# Patient Record
Sex: Female | Born: 1980
Health system: Southern US, Community
[De-identification: ages and names within clinical notes are randomized; demographics above are authoritative.]

## PROBLEM LIST (undated history)

## (undated) ENCOUNTER — Emergency Department (HOSPITAL_COMMUNITY): Admission: EM | Payer: 59 | Source: Home / Self Care

## (undated) DIAGNOSIS — Z9889 Other specified postprocedural states: Secondary | ICD-10-CM

## (undated) DIAGNOSIS — D649 Anemia, unspecified: Secondary | ICD-10-CM

## (undated) DIAGNOSIS — R112 Nausea with vomiting, unspecified: Secondary | ICD-10-CM

## (undated) DIAGNOSIS — N920 Excessive and frequent menstruation with regular cycle: Secondary | ICD-10-CM

## (undated) DIAGNOSIS — J189 Pneumonia, unspecified organism: Secondary | ICD-10-CM

## (undated) DIAGNOSIS — I82402 Acute embolism and thrombosis of unspecified deep veins of left lower extremity: Secondary | ICD-10-CM

## (undated) HISTORY — PX: WISDOM TOOTH EXTRACTION: SHX21

## (undated) HISTORY — DX: Anemia, unspecified: D64.9

## (undated) HISTORY — PX: TUBAL LIGATION: SHX77

## (undated) HISTORY — PX: CHOLECYSTECTOMY: SHX55

## (undated) HISTORY — DX: Acute embolism and thrombosis of unspecified deep veins of left lower extremity: I82.402

## (undated) HISTORY — DX: Excessive and frequent menstruation with regular cycle: N92.0

---

## 2012-10-06 ENCOUNTER — Emergency Department: Payer: Self-pay | Admitting: Emergency Medicine

## 2012-10-06 LAB — CBC WITH DIFFERENTIAL/PLATELET
Basophil %: 0.9 %
Eosinophil #: 0.2 10*3/uL (ref 0.0–0.7)
Eosinophil %: 1.7 %
HCT: 35 % (ref 35.0–47.0)
HGB: 11.8 g/dL — ABNORMAL LOW (ref 12.0–16.0)
MCH: 26.7 pg (ref 26.0–34.0)
MCHC: 33.6 g/dL (ref 32.0–36.0)
MCV: 79 fL — ABNORMAL LOW (ref 80–100)
Monocyte %: 5.1 %
Neutrophil %: 65.2 %
Platelet: 367 10*3/uL (ref 150–440)
RDW: 14.8 % — ABNORMAL HIGH (ref 11.5–14.5)
WBC: 12 10*3/uL — ABNORMAL HIGH (ref 3.6–11.0)

## 2012-10-06 LAB — URINALYSIS, COMPLETE
Bilirubin,UR: NEGATIVE
Ketone: NEGATIVE
Nitrite: NEGATIVE
Protein: NEGATIVE
RBC,UR: 1 /HPF (ref 0–5)
Specific Gravity: 1.015 (ref 1.003–1.030)
Squamous Epithelial: 2

## 2012-10-06 LAB — BASIC METABOLIC PANEL
BUN: 11 mg/dL (ref 7–18)
Calcium, Total: 8.8 mg/dL (ref 8.5–10.1)
Chloride: 106 mmol/L (ref 98–107)
Co2: 28 mmol/L (ref 21–32)
Creatinine: 0.75 mg/dL (ref 0.60–1.30)
EGFR (Non-African Amer.): >60
Glucose: 76 mg/dL (ref 65–99)
Osmolality: 274 (ref 275–301)
Potassium: 4.1 mmol/L (ref 3.5–5.1)
Sodium: 138 mmol/L (ref 136–145)

## 2013-02-16 ENCOUNTER — Emergency Department: Payer: Self-pay | Admitting: Emergency Medicine

## 2013-02-16 LAB — URINALYSIS, COMPLETE
Bilirubin,UR: NEGATIVE
Glucose,UR: NEGATIVE mg/dL (ref 0–75)
Ketone: NEGATIVE
Leukocyte Esterase: NEGATIVE
Ph: 5 (ref 4.5–8.0)
Protein: NEGATIVE
RBC,UR: 31 /HPF (ref 0–5)
Squamous Epithelial: <1

## 2013-02-16 LAB — CBC
HCT: 37.4 % (ref 35.0–47.0)
MCV: 81 fL (ref 80–100)
RDW: 15.3 % — ABNORMAL HIGH (ref 11.5–14.5)

## 2013-02-16 LAB — HCG, QUANTITATIVE, PREGNANCY: Beta Hcg, Quant.: <1 m[IU]/mL — ABNORMAL LOW

## 2013-02-26 ENCOUNTER — Encounter (HOSPITAL_COMMUNITY): Payer: Self-pay | Admitting: Emergency Medicine

## 2013-02-26 ENCOUNTER — Emergency Department (HOSPITAL_COMMUNITY)
Admission: EM | Admit: 2013-02-26 | Discharge: 2013-02-26 | Disposition: A | Discharge status: Home / Self Care | Payer: Self-pay | Attending: Emergency Medicine | Admitting: Emergency Medicine

## 2013-02-26 ENCOUNTER — Emergency Department (HOSPITAL_COMMUNITY): Payer: Self-pay

## 2013-02-26 DIAGNOSIS — R630 Anorexia: Secondary | ICD-10-CM | POA: Insufficient documentation

## 2013-02-26 DIAGNOSIS — Z9089 Acquired absence of other organs: Secondary | ICD-10-CM | POA: Insufficient documentation

## 2013-02-26 DIAGNOSIS — N390 Urinary tract infection, site not specified: Secondary | ICD-10-CM | POA: Insufficient documentation

## 2013-02-26 DIAGNOSIS — R110 Nausea: Secondary | ICD-10-CM | POA: Insufficient documentation

## 2013-02-26 DIAGNOSIS — R109 Unspecified abdominal pain: Secondary | ICD-10-CM | POA: Insufficient documentation

## 2013-02-26 DIAGNOSIS — Z3202 Encounter for pregnancy test, result negative: Secondary | ICD-10-CM | POA: Insufficient documentation

## 2013-02-26 LAB — COMPREHENSIVE METABOLIC PANEL
ALT: 14 U/L (ref 0–35)
AST: 17 U/L (ref 0–37)
Albumin: 3.7 g/dL (ref 3.5–5.2)
Alkaline Phosphatase: 98 U/L (ref 39–117)
BUN: 8 mg/dL (ref 6–23)
CO2: 27 mEq/L (ref 19–32)
Calcium: 9.4 mg/dL (ref 8.4–10.5)
Chloride: 103 mEq/L (ref 96–112)
Creatinine, Ser: 0.84 mg/dL (ref 0.50–1.10)
GFR calc Af Amer: >90 mL/min (ref >90)
GFR calc non Af Amer: >90 mL/min (ref >90)
Glucose, Bld: 90 mg/dL (ref 70–99)
Potassium: 4.2 mEq/L (ref 3.5–5.1)
Sodium: 138 mEq/L (ref 135–145)
Total Bilirubin: 0.6 mg/dL (ref 0.3–1.2)
Total Protein: 7.4 g/dL (ref 6.0–8.3)

## 2013-02-26 LAB — CBC WITH DIFFERENTIAL/PLATELET
Basophils Absolute: 0.1 10*3/uL (ref 0.0–0.1)
Basophils Relative: 1 % (ref 0–1)
Eosinophils Absolute: 0.1 10*3/uL (ref 0.0–0.7)
Eosinophils Relative: 1 % (ref 0–5)
HCT: 35.6 % — ABNORMAL LOW (ref 36.0–46.0)
Hemoglobin: 12.1 g/dL (ref 12.0–15.0)
Lymphocytes Relative: 25 % (ref 12–46)
Lymphs Abs: 3 10*3/uL (ref 0.7–4.0)
MCH: 27.1 pg (ref 26.0–34.0)
MCHC: 34 g/dL (ref 30.0–36.0)
MCV: 79.8 fL (ref 78.0–100.0)
Monocytes Absolute: 0.6 10*3/uL (ref 0.1–1.0)
Monocytes Relative: 5 % (ref 3–12)
Neutro Abs: 8.1 10*3/uL — ABNORMAL HIGH (ref 1.7–7.7)
Neutrophils Relative %: 68 % (ref 43–77)
Platelets: 400 10*3/uL (ref 150–400)
RBC: 4.46 MIL/uL (ref 3.87–5.11)
RDW: 14.2 % (ref 11.5–15.5)
WBC: 11.9 10*3/uL — ABNORMAL HIGH (ref 4.0–10.5)

## 2013-02-26 LAB — URINE MICROSCOPIC-ADD ON

## 2013-02-26 LAB — URINALYSIS, ROUTINE W REFLEX MICROSCOPIC
Bilirubin Urine: NEGATIVE
Glucose, UA: NEGATIVE mg/dL
Hgb urine dipstick: NEGATIVE
Ketones, ur: NEGATIVE mg/dL
Nitrite: NEGATIVE
Protein, ur: NEGATIVE mg/dL
Specific Gravity, Urine: 1.017 (ref 1.005–1.030)
Urobilinogen, UA: 0.2 mg/dL (ref 0.0–1.0)
pH: 5 (ref 5.0–8.0)

## 2013-02-26 LAB — LIPASE, BLOOD: Lipase: 16 U/L (ref 11–59)

## 2013-02-26 MED ORDER — SODIUM CHLORIDE 0.9 % IV BOLUS (SEPSIS)
1000.0000 mL | Freq: Once | INTRAVENOUS | Status: AC
  Administered 2013-02-26: 1000 mL via INTRAVENOUS

## 2013-02-26 MED ORDER — NITROFURANTOIN MONOHYD MACRO 100 MG PO CAPS: 100.0000 mg | ORAL_CAPSULE | Freq: Two times a day (BID) | ORAL | Status: DC

## 2013-02-26 MED ORDER — ONDANSETRON 4 MG PO TBDP
4.0000 mg | ORAL_TABLET | Freq: Once | ORAL | Status: AC
  Administered 2013-02-26: 4 mg via ORAL
  Filled 2013-02-26: qty 1

## 2013-02-26 MED ORDER — ONDANSETRON HCL 4 MG/2ML IJ SOLN
4.0000 mg | Freq: Once | INTRAMUSCULAR | Status: AC
  Administered 2013-02-26: 4 mg via INTRAVENOUS
  Filled 2013-02-26: qty 2

## 2013-02-26 MED ORDER — IBUPROFEN 800 MG PO TABS: 800.0000 mg | ORAL_TABLET | Freq: Three times a day (TID) | ORAL | Status: DC

## 2013-02-26 MED ORDER — IOHEXOL 350 MG/ML SOLN
100.0000 mL | Freq: Once | INTRAVENOUS | Status: AC | PRN
  Administered 2013-02-26: 100 mL via INTRAVENOUS

## 2013-02-26 NOTE — ED Notes (Addendum)
PA at bedside.

## 2013-02-26 NOTE — ED Notes (Signed)
Pt reports abdominal pain x 1 week that is on left lower quad. Pain is 7/10. Positive for nausea denies vomiting or diarrhea. Denies fever. Pt reports not wanting to eat anything. Alert and oriented. NAD.

## 2013-02-26 NOTE — ED Provider Notes (Signed)
Medical screening examination/treatment/procedure(s) were conducted as a shared visit with non-physician practitioner(s) and myself.  I personally evaluated the patient during the encounter.  32 year old female with abdominal pain. For quadrant. Gradual onset about a week here. Pain waxes and wanes but does not completely go away. Achy with occasional sharper pain. No appreciable exacerbating/relieving factors. No vomiting or diarrhea. No urinary complaints. No vaginal bleeding or discharge. No history of similar pain. On exam pt is laying in bed in NAD. Clinically well appearing. Tenderness in her left upper quadrant with voluntary guarding on deep palpation. No rebound tenderness. No distention. Patient reports a history of splenomegaly. Her spleen was nonpalpable on my exam although somewhat limited given patient's obesity. She also reports a history of "spots on my spleen" which she says were noted on a routine cholecystectomy in 2012. No CVA tenderness. No local care. Recently moved to area and previous care in [place]. She is not sure the exact etiology of this and never pursued followup. Her brother has a history of DVT and pulmonary embolism. They do not have a very close relationship and she's not sure what precipitated these blood clots. She has no known personal history of coagulopathy. Not sure of exact etiology of pt's pain. Not convincing story for ureteral colic and no blood noted on UA. Not sure of what to make of history of splenic "spots." Will CT to further eval. Angiography to assess for possible splenic infarct.  Raeford Razor, MD 02/26/13 [phone]

## 2013-02-26 NOTE — Progress Notes (Signed)
   CARE MANAGEMENT ED NOTE 02/26/2013  Patient:  Robin Lopez,Robin Lopez   Account Number:  [id number]  Date Initiated:  02/26/2013  Documentation initiated by:  Radford Pax  Subjective/Objective Assessment:   Patient presented to ED with abdominal pain     Subjective/Objective Assessment Detail:     Action/Plan:   Action/Plan Detail:   Anticipated DC Date:       Status Recommendation to Physician:   Result of Recommendation:    Other ED Services  Consult Working Plan    DC Planning Services  Other  PCP issues    Choice offered to / List presented to:            Status of service:  Completed, signed off  ED Comments:   ED Comments Detail:  Patient listed as not having insurance or pcp.  [facility] represenattive has seen patient today and provided inforation for the orange card.  EDCM provided list of pcps who accept self pay patients and infromation for Adult Care Act.  Also provided medication assistance such as needymeds.org, and pharmacies who offer discount prescriptions such as [facility], [facility], and [facility] etc. Patient thankful for information.

## 2013-02-26 NOTE — ED Provider Notes (Signed)
History    CSN: [id number] Arrival date & time 02/26/13  1207  First MD Initiated Contact with Patient 02/26/13 1333     Chief Complaint  Patient presents with  . Abdominal Pain   (Consider location/radiation/quality/duration/timing/severity/associated sxs/prior Treatment) HPI Pt is a 32yo female with presenting with 1 week hx of left sided abdominal pain, 7/10 but 10/10 at worst.  Pain feels like "heavy, stabbing" Pt states she feels bloated, pain radiates to left flank and epigastrium.  Reports severe nausea but denies vomiting or diarrhea. Unsure if she's had a fever. Has tried aleve w/o relief.  Lying on left side makes pain worse.  Denies urinary symptoms.  Reports hx of cholecystectomy in 2012 but no other medical hx.  Denies hx of kidney stones. LMP last week, nl per pt.   History reviewed. No pertinent past medical history. Past Surgical History  Procedure Laterality Date  . Cholecystectomy     No family history on file. History  Substance Use Topics  . Smoking status: Never Smoker   . Smokeless tobacco: Not on file  . Alcohol Use: No   OB History   Grav Para Term Preterm Abortions TAB SAB Ect Mult Living                 Review of Systems  Constitutional: Positive for appetite change ( decreased for 1 week). Negative for fever, chills, diaphoresis and fatigue.  Gastrointestinal: Positive for nausea and abdominal pain. Negative for vomiting, diarrhea and constipation.  Genitourinary: Positive for flank pain ( left). Negative for dysuria, frequency, hematuria, menstrual problem and pelvic pain.  All other systems reviewed and are negative.    Allergies  Prednisone  Home Medications   Current Outpatient Rx  Name  Route  Sig  Dispense  Refill  . naproxen sodium (ANAPROX) 220 MG tablet   Oral   Take 220 mg by mouth as needed (for pain).         Marland Kitchen ibuprofen (ADVIL,MOTRIN) 800 MG tablet   Oral   Take 1 tablet (800 mg total) by mouth 3 (three) times daily.  21 tablet   0   . nitrofurantoin, macrocrystal-monohydrate, (MACROBID) 100 MG capsule   Oral   Take 1 capsule (100 mg total) by mouth 2 (two) times daily.   10 capsule   0    BP 123/75  Pulse 80  Temp(Src) 98.3 F (36.8 C) (Oral)  Resp 16  SpO2 100%  LMP 02/19/2013 Physical Exam  Nursing note and vitals reviewed. Constitutional: She appears well-developed and well-nourished.  Pt lying on right side in exam bed, appears mildly uncomfortable.   HENT:  Head: Normocephalic and atraumatic.  Eyes: Conjunctivae are normal. No scleral icterus.  Neck: Normal range of motion.  Cardiovascular: Normal rate, regular rhythm and normal heart sounds.   Pulmonary/Chest: Effort normal and breath sounds normal. No respiratory distress. She has no wheezes. She has no rales. She exhibits no tenderness.  Abdominal: Soft. Bowel sounds are normal. She exhibits no distension and no mass. There is tenderness. There is CVA tenderness ( left). There is no rebound and no guarding.  Musculoskeletal: Normal range of motion.  Neurological: She is alert.  Skin: Skin is warm and dry. She is not diaphoretic.    ED Course  Procedures (including critical care time) Labs Reviewed  CBC WITH DIFFERENTIAL - Abnormal; Notable for the following:    WBC 11.9 (*)    HCT 35.6 (*)    Neutro Abs 8.1 (*)  All other components within normal limits  URINALYSIS, ROUTINE W REFLEX MICROSCOPIC - Abnormal; Notable for the following:    APPearance CLOUDY (*)    Leukocytes, UA MODERATE (*)    All other components within normal limits  URINE MICROSCOPIC-ADD ON - Abnormal; Notable for the following:    Squamous Epithelial / LPF FEW (*)    Bacteria, UA FEW (*)    All other components within normal limits  URINE CULTURE  COMPREHENSIVE METABOLIC PANEL  LIPASE, BLOOD  POCT PREGNANCY, URINE   Ct Cta Abd/pel W/cm &/or W/o Cm  02/26/2013   *RADIOLOGY REPORT*  Clinical Data: Left upper quadrant abdominal pain; evaluate for  splenic infarcts; history of "spots" on the spleen and liver  CT ANGIOGRAPHY ABDOMEN AND PELVIS  Technique:  Multidetector CT imaging of the abdomen and pelvis was performed using the standard protocol during bolus administration of intravenous contrast.  Multiplanar reconstructed images including MIPs were obtained and reviewed to evaluate the vascular anatomy.  Contrast: OMNIPAQUE IOHEXOL 350 MG/ML SOLN  Comparison:   None.  Findings:  Abdominal aorta:  Normal caliber abdominal aorta.  No abdominal aortic dissection or periaortic stranding.  Celiac artery:  Widely patent without hemodynamically significant narrowing.  Conventional branching pattern.  SMA:  Widely patent without hemodynamically significant narrowing.  Right renal artery:  Solitary; widely patent.  Left renal artery:  Duplicated; there is accessory left-sided renal artery which supplies the superior pole left kidney.  No evidence of hemodynamically significant narrowing.  IMA:  Widely patent.  Pelvic vasculature:  Widely patent.   Review of the MIP images confirms the above findings.  --------------------------------------------------------  Nonvascular findings:  Normal hepatic contour.  No discrete hepatic lesions.  Post cholecystectomy.  No intra or extra panic biliary dilatation.  No ascites.  There is symmetric enhancement and excretion of the bilateral kidneys. Incidental note is made of a partially exophytic approximately 1.5 cm hypoattenuating (20 HU) cyst arising from the anterior aspect the mid pole of the left kidney (coronal image 75, series nine).  No discrete right-sided renal lesions.  No definite renal stones.  No urinary obstruction or perinephric stranding. Normal appearance of the bilateral adrenal glands and pancreas.  There is an approximately 1.3 x 1.2 cm hypoattenuating, nonenhancing cyst within the subcapsular aspect of the spleen (image 46, series 4). An additional sub centimeter hypoattenuating lesion within the  adjacent spleen is too small to fully characterize (image 43).  No evidence of splenic infarction. Incidental note is made of a small splenule.  No perisplenic stranding.  The bowel is normal in course and caliber without wall thickening or evidence of obstruction.  Normal appearance of the appendix.  No pneumoperitoneum, pneumatosis or portal venous gas.  No retroperitoneal, mesenteric, pelvic or inguinal lymphadenopathy.  There is a suspected approximately 5.4 x 4.9 cm fibroid within an otherwise normal-appearing retroverted uterus (axial image 125, series four).  There is a small amount of fluid within the endometrial canal, presumably physiologic.  No discrete adnexal lesion.  Trace amount of presumably physiologic free fluid within the pelvic cul-de-sac.  Limited visualization of the lower thorax demonstrates linear subsegmental ground-glass atelectasis within the bilateral lower lobes, left greater than right.  No focal airspace opacities.  No pleural effusion.  Normal heart size.  No pericardial effusion.  No acute or aggressive osseous abnormalities. There is a small mesenteric fat containing periumbilical hernia.  IMPRESSION:  1.  No explanation for patient's left upper quadrant abdominal pain.  Specifically, no evidence  of splenic infarction.  2.  Note is made of an approximately 1.3 cm nonenhancing splenic cyst. 3.  Suspected approximately 5.4 cm fibroid within an otherwise normal-appearing retroverted uterus.   Small amount of presumably physiologic free fluid in the pelvic  4.  Approximately 1.5 cm left sided renal cyst.   Original Report Authenticated By: Tacey Ruiz, MD   1. Abdominal pain   2. UTI (lower urinary tract infection)     MDM  Pt c/o 1wk hx of left side abdominal pain: concern for pyelonephritis, nephrolithiasis, IBS, diverticulitis.  Vitals: mildly tachycardic, otherwise unremarkable    CBC: unremarkable CMP: unremarkable Lipase: nl UA: moderate leuks, due to pt's symptoms  will tx for possible UTI and have urine cultured Urine Preg: neg  Rx: macrobid and ibuprofen, Will discharge pt home and have her f/u with [facility] and [facility] info provided. Return precautions given. Pt verbalized understanding and agreement with tx plan. Vitals: unremarkable. Discharged in stable condition.    Discussed pt with attending during ED encounter.  8:47 PMUpon discharging pt, pt mentioned she has had problems with her spleen in the past including having some "spots" on her spleen with swelling.  States she does not know what the spots were but had been told by PCP in [place] to f/u with, however, this was never done.    Discussed with Dr. Juleen China who will speak with pt and likely perform CT scan.    CT angio looking for splenic infarct: no explanation for pt's LUQ pain. No evidence of splenic infarction.  There is a 1.3cm nonenhancing splenic cyst.  This is likely what pt's previous surgeon was referring to and pt's interpretation of "spots" on her spleen.  Will continue to tx for possible UTI and give ibuprofen for pain. Will discharge pt home and have her f/u with [facility] and [facility] info provided. Return precautions given. Pt verbalized understanding and agreement with tx plan. Vitals: unremarkable. Discharged in stable condition.    Discussed pt with attending during ED encounter.    Junius Finner, PA-C 02/26/13 2047

## 2013-02-27 LAB — URINE CULTURE: Colony Count: 1000

## 2013-03-03 NOTE — ED Provider Notes (Signed)
Medical screening examination/treatment/procedure(s) were performed by non-physician practitioner and as supervising physician I was immediately available for consultation/collaboration.  Flint Melter, MD 03/03/13 [phone]

## 2014-01-21 ENCOUNTER — Encounter (HOSPITAL_COMMUNITY): Payer: Self-pay | Admitting: *Deleted

## 2014-01-21 ENCOUNTER — Inpatient Hospital Stay (HOSPITAL_COMMUNITY)
Admission: AD | Admit: 2014-01-21 | Discharge: 2014-01-21 | Disposition: A | Discharge status: Home / Self Care | Payer: Self-pay | Source: Ambulatory Visit | Attending: Obstetrics & Gynecology | Admitting: Obstetrics & Gynecology

## 2014-01-21 DIAGNOSIS — N938 Other specified abnormal uterine and vaginal bleeding: Secondary | ICD-10-CM | POA: Insufficient documentation

## 2014-01-21 DIAGNOSIS — N925 Other specified irregular menstruation: Secondary | ICD-10-CM | POA: Insufficient documentation

## 2014-01-21 DIAGNOSIS — R109 Unspecified abdominal pain: Secondary | ICD-10-CM | POA: Insufficient documentation

## 2014-01-21 DIAGNOSIS — N949 Unspecified condition associated with female genital organs and menstrual cycle: Secondary | ICD-10-CM | POA: Insufficient documentation

## 2014-01-21 DIAGNOSIS — N926 Irregular menstruation, unspecified: Secondary | ICD-10-CM

## 2014-01-21 DIAGNOSIS — N939 Abnormal uterine and vaginal bleeding, unspecified: Secondary | ICD-10-CM

## 2014-01-21 DIAGNOSIS — Z9851 Tubal ligation status: Secondary | ICD-10-CM | POA: Insufficient documentation

## 2014-01-21 LAB — CBC
HCT: 35.5 % — ABNORMAL LOW (ref 36.0–46.0)
HEMOGLOBIN: 12.5 g/dL (ref 12.0–15.0)
MCH: 29.2 pg (ref 26.0–34.0)
MCHC: 35.2 g/dL (ref 30.0–36.0)
MCV: 82.9 fL (ref 78.0–100.0)
Platelets: 368 10*3/uL (ref 150–400)
RBC: 4.28 MIL/uL (ref 3.87–5.11)
RDW: 14 % (ref 11.5–15.5)
WBC: 11.4 10*3/uL — AB (ref 4.0–10.5)

## 2014-01-21 LAB — URINALYSIS, ROUTINE W REFLEX MICROSCOPIC
Bilirubin Urine: NEGATIVE
Glucose, UA: NEGATIVE mg/dL
KETONES UR: NEGATIVE mg/dL
LEUKOCYTES UA: NEGATIVE
NITRITE: NEGATIVE
PH: 6 (ref 5.0–8.0)
PROTEIN: NEGATIVE mg/dL
Specific Gravity, Urine: 1.02 (ref 1.005–1.030)
Urobilinogen, UA: 0.2 mg/dL (ref 0.0–1.0)

## 2014-01-21 LAB — HCG, QUANTITATIVE, PREGNANCY: hCG, Beta Chain, Quant, S: <1 m[IU]/mL (ref <5)

## 2014-01-21 LAB — WET PREP, GENITAL
Clue Cells Wet Prep HPF POC: NONE SEEN
Trich, Wet Prep: NONE SEEN
WBC WET PREP: NONE SEEN
Yeast Wet Prep HPF POC: NONE SEEN

## 2014-01-21 LAB — URINE MICROSCOPIC-ADD ON

## 2014-01-21 LAB — POCT PREGNANCY, URINE: PREG TEST UR: NEGATIVE

## 2014-01-21 MED ORDER — KETOROLAC TROMETHAMINE 60 MG/2ML IM SOLN
60.0000 mg | Freq: Once | INTRAMUSCULAR | Status: AC
  Administered 2014-01-21: 60 mg via INTRAMUSCULAR
  Filled 2014-01-21: qty 2

## 2014-01-21 NOTE — MAU Note (Signed)
Patient states she started having sharp shooting pain on 5-19 and has pain off and on now. States she thought she was starting her period with spotting but became heavy yesterday and now passing multiple large clots and having low back pain. Has nausea.

## 2014-01-21 NOTE — Discharge Instructions (Signed)

## 2014-01-21 NOTE — MAU Provider Note (Signed)
Attestation of Attending Supervision of Advanced Practitioner (CNM/NP): Evaluation and management procedures were performed by the Advanced Practitioner under my supervision and collaboration.  I have reviewed the Advanced Practitioner's note and chart, and I agree with the management and plan.  Lylla Eifler Harraway-Smith 2:17 PM

## 2014-01-21 NOTE — MAU Provider Note (Signed)
History     CSN: [id number]  Arrival date and time: 01/21/14 2119   First Provider Initiated Contact with Patient 01/21/14 1052      Chief Complaint  Patient presents with  . Vaginal Bleeding  . Abdominal Pain   HPI [name] is a 33 y.o. female G2P2002 who present with abnormal vaginal bleeding. Patient states "It is uncommon for me to have an abnormal cycle"; "my cycles are usually around the same time every month".  LMP: April 5th, this was a normal cycle.  Pt started spotting on Wednsnday and started experiencing heavy bleeding yesterday, along with passing a few large blood clots. She is also experiencing abdominal pain/cramping that she currently rates a 7/10. She took 1 aleve yesterday for her pain without relief; she has not taken anything for the pain today.  Pt had a tubal ligation 2 years ago. No history of fibroids or ovarian cysts.   OB History   Grav Para Term Preterm Abortions TAB SAB Ect Mult Living   2 2 2       2       History reviewed. No pertinent past medical history.  Past Surgical History  Procedure Laterality Date  . Cholecystectomy    . Cesarean section    . Tubal ligation      No family history on file.  History  Substance Use Topics  . Smoking status: Never Smoker   . Smokeless tobacco: Never Used  . Alcohol Use: No    Allergies:  Allergies  Allergen Reactions  . Prednisone Itching and Rash    No prescriptions prior to admission   Results for orders placed during the hospital encounter of 01/21/14 (from the past 48 hour(s))  URINALYSIS, ROUTINE W REFLEX MICROSCOPIC     Status: Abnormal   Collection Time    01/21/14  9:55 AM      Result Value Ref Range   Color, Urine YELLOW  YELLOW   APPearance HAZY (*) CLEAR   Specific Gravity, Urine 1.020  1.005 - 1.030   pH 6.0  5.0 - 8.0   Glucose, UA NEGATIVE  NEGATIVE mg/dL   Hgb urine dipstick LARGE (*) NEGATIVE   Bilirubin Urine NEGATIVE  NEGATIVE   Ketones, ur NEGATIVE   NEGATIVE mg/dL   Protein, ur NEGATIVE  NEGATIVE mg/dL   Urobilinogen, UA 0.2  0.0 - 1.0 mg/dL   Nitrite NEGATIVE  NEGATIVE   Leukocytes, UA NEGATIVE  NEGATIVE  CBC     Status: Abnormal   Collection Time    01/21/14  9:55 AM      Result Value Ref Range   WBC 11.4 (*) 4.0 - 10.5 K/uL   RBC 4.28  3.87 - 5.11 MIL/uL   Hemoglobin 12.5  12.0 - 15.0 g/dL   HCT 35.5 (*) 36.0 - 46.0 %   MCV 82.9  78.0 - 100.0 fL   MCH 29.2  26.0 - 34.0 pg   MCHC 35.2  30.0 - 36.0 g/dL   RDW 14.0  11.5 - 15.5 %   Platelets 368  150 - 400 K/uL  URINE MICROSCOPIC-ADD ON     Status: None   Collection Time    01/21/14  9:55 AM      Result Value Ref Range   Squamous Epithelial / LPF RARE  RARE   WBC, UA 0-2  <3 WBC/hpf   RBC / HPF 21-50  <3 RBC/hpf  HCG, QUANTITATIVE, PREGNANCY     Status: None   Collection  Time    01/21/14  9:55 AM      Result Value Ref Range   hCG, Beta Chain, Quant, S <1  <5 mIU/mL   Comment:              GEST. AGE      CONC.  (mIU/mL)       <=1 WEEK        5 - 50         2 WEEKS       50 - 500         3 WEEKS       100 - 10,000         4 WEEKS     1,000 - 30,000         5 WEEKS     3,500 - 115,000       6-8 WEEKS     12,000 - 270,000        12 WEEKS     15,000 - 220,000                FEMALE AND NON-PREGNANT FEMALE:         LESS THAN 5 mIU/mL  POCT PREGNANCY, URINE     Status: None   Collection Time    01/21/14 10:02 AM      Result Value Ref Range   Preg Test, Ur NEGATIVE  NEGATIVE   Comment:            THE SENSITIVITY OF THIS     METHODOLOGY IS >24 mIU/mL  WET PREP, GENITAL     Status: None   Collection Time    01/21/14 11:11 AM      Result Value Ref Range   Yeast Wet Prep HPF POC NONE SEEN  NONE SEEN   Trich, Wet Prep NONE SEEN  NONE SEEN   Clue Cells Wet Prep HPF POC NONE SEEN  NONE SEEN   WBC, Wet Prep HPF POC NONE SEEN  NONE SEEN   Comment: FEW BACTERIA SEEN     Review of Systems  Constitutional: Negative for fever and chills.  Gastrointestinal: Positive for  nausea and abdominal pain (Bilateral lower abdominal pain ). Negative for diarrhea and constipation.  Genitourinary: Negative for dysuria, urgency, frequency and hematuria.       No vaginal discharge. + vaginal bleeding. No dysuria.    Physical Exam   Blood pressure 108/60, pulse 60, temperature 98.7 F (37.1 C), temperature source Oral, resp. rate 16, height 5' 7.5" (1.715 m), weight 101.061 kg (222 lb 12.8 oz), last menstrual period 01/20/2014, SpO2 100.00%.  Physical Exam  Constitutional: She is oriented to person, place, and time. She appears well-developed and well-nourished. No distress.  HENT:  Head: Normocephalic.  Eyes: Pupils are equal, round, and reactive to light.  Neck: Neck supple.  Respiratory: Effort normal.  GI: Soft. There is tenderness (+ suprapubic tenderness ).  Genitourinary:  Speculum exam: Vagina - Small amount of dark red blood in vaginal canal, no odor, no blood clots noted Cervix - small active bleeding from os Bimanual exam: Cervix FT, no CMT  Uterus non tender, Enlarged uterus  Adnexa non tender, no masses bilaterally, +suprapubic tenderness  GC/Chlam, wet prep done Chaperone present for exam.   Musculoskeletal: Normal range of motion.  Neurological: She is alert and oriented to person, place, and time.  Skin: Skin is warm. She is not diaphoretic.  Psychiatric: Her behavior is normal.    MAU Course  Procedures None  MDM UA Upt CBC; hgb normal  Toradol 60 mg IM  Patient rates her pain 3/10 following Toradol Enlarged uterus on exam; Quant less than 1. Will schedule patient for outpatient pelvic US and have her follow up in the GYN clinic.    Assessment and Plan   A:  1. Episode of heavy vaginal bleeding      P:  Discharge home in stable condition  Pelvic US scheduled outpatient on 5/28 at 1500; pt made aware Message to the [facility] clinic sent for follow up. Ibuprofen as directed on the bottle for the next 3 days Bleeding  precautions discussed at length  [place], NP 01/21/2014, 10:52 AM

## 2014-01-22 LAB — GC/CHLAMYDIA PROBE AMP
CT PROBE, AMP APTIMA: NEGATIVE
GC PROBE AMP APTIMA: NEGATIVE

## 2014-01-27 ENCOUNTER — Other Ambulatory Visit (HOSPITAL_COMMUNITY): Payer: Self-pay | Admitting: Obstetrics and Gynecology

## 2014-01-27 ENCOUNTER — Ambulatory Visit (HOSPITAL_COMMUNITY): Admit: 2014-01-27 | Payer: Self-pay

## 2014-01-27 ENCOUNTER — Encounter: Payer: Self-pay | Admitting: Medical

## 2014-01-27 DIAGNOSIS — N939 Abnormal uterine and vaginal bleeding, unspecified: Secondary | ICD-10-CM

## 2014-02-25 ENCOUNTER — Encounter: Payer: Self-pay | Admitting: Medical

## 2014-05-09 ENCOUNTER — Encounter (HOSPITAL_COMMUNITY): Payer: Self-pay | Admitting: Emergency Medicine

## 2014-05-09 ENCOUNTER — Emergency Department (HOSPITAL_COMMUNITY)
Admission: EM | Admit: 2014-05-09 | Discharge: 2014-05-09 | Disposition: A | Discharge status: Home / Self Care | Payer: Self-pay | Attending: Emergency Medicine | Admitting: Emergency Medicine

## 2014-05-09 DIAGNOSIS — Z3202 Encounter for pregnancy test, result negative: Secondary | ICD-10-CM | POA: Insufficient documentation

## 2014-05-09 DIAGNOSIS — K625 Hemorrhage of anus and rectum: Secondary | ICD-10-CM | POA: Insufficient documentation

## 2014-05-09 DIAGNOSIS — K644 Residual hemorrhoidal skin tags: Secondary | ICD-10-CM | POA: Insufficient documentation

## 2014-05-09 DIAGNOSIS — K921 Melena: Secondary | ICD-10-CM | POA: Insufficient documentation

## 2014-05-09 LAB — CBC WITH DIFFERENTIAL/PLATELET
BASOS PCT: 0 % (ref 0–1)
Basophils Absolute: 0 10*3/uL (ref 0.0–0.1)
Eosinophils Absolute: 0.1 10*3/uL (ref 0.0–0.7)
Eosinophils Relative: 1 % (ref 0–5)
HCT: 35.4 % — ABNORMAL LOW (ref 36.0–46.0)
Hemoglobin: 12.4 g/dL (ref 12.0–15.0)
Lymphocytes Relative: 27 % (ref 12–46)
Lymphs Abs: 3.6 10*3/uL (ref 0.7–4.0)
MCH: 28.6 pg (ref 26.0–34.0)
MCHC: 35 g/dL (ref 30.0–36.0)
MCV: 81.6 fL (ref 78.0–100.0)
MONO ABS: 0.7 10*3/uL (ref 0.1–1.0)
MONOS PCT: 5 % (ref 3–12)
Neutro Abs: 8.7 10*3/uL — ABNORMAL HIGH (ref 1.7–7.7)
Neutrophils Relative %: 67 % (ref 43–77)
Platelets: 403 10*3/uL — ABNORMAL HIGH (ref 150–400)
RBC: 4.34 MIL/uL (ref 3.87–5.11)
RDW: 13.9 % (ref 11.5–15.5)
WBC: 13.1 10*3/uL — ABNORMAL HIGH (ref 4.0–10.5)

## 2014-05-09 LAB — URINALYSIS, ROUTINE W REFLEX MICROSCOPIC
Bilirubin Urine: NEGATIVE
Glucose, UA: NEGATIVE mg/dL
Ketones, ur: NEGATIVE mg/dL
LEUKOCYTES UA: NEGATIVE
Nitrite: NEGATIVE
PROTEIN: NEGATIVE mg/dL
Specific Gravity, Urine: 1.027 (ref 1.005–1.030)
Urobilinogen, UA: 1 mg/dL (ref 0.0–1.0)
pH: 5.5 (ref 5.0–8.0)

## 2014-05-09 LAB — COMPREHENSIVE METABOLIC PANEL
ALBUMIN: 3.8 g/dL (ref 3.5–5.2)
ALT: 37 U/L — ABNORMAL HIGH (ref 0–35)
ANION GAP: 18 — AB (ref 5–15)
AST: 43 U/L — ABNORMAL HIGH (ref 0–37)
Alkaline Phosphatase: 125 U/L — ABNORMAL HIGH (ref 39–117)
BUN: 20 mg/dL (ref 6–23)
CO2: 17 meq/L — AB (ref 19–32)
CREATININE: 0.75 mg/dL (ref 0.50–1.10)
Calcium: 9.4 mg/dL (ref 8.4–10.5)
Chloride: 103 mEq/L (ref 96–112)
GFR calc non Af Amer: >90 mL/min (ref >90)
Glucose, Bld: 96 mg/dL (ref 70–99)
Potassium: 4.1 mEq/L (ref 3.7–5.3)
Sodium: 138 mEq/L (ref 137–147)
Total Bilirubin: 0.6 mg/dL (ref 0.3–1.2)
Total Protein: 7.8 g/dL (ref 6.0–8.3)

## 2014-05-09 LAB — POC OCCULT BLOOD, ED: Fecal Occult Bld: POSITIVE — AB

## 2014-05-09 LAB — POC URINE PREG, ED: Preg Test, Ur: NEGATIVE

## 2014-05-09 LAB — URINE MICROSCOPIC-ADD ON

## 2014-05-09 NOTE — Discharge Instructions (Signed)
Do not hesitate to return to the emergency room for any new, worsening or concerning symptoms.  Please obtain primary care using resource guide below. But the minute you were seen in the emergency room and that they will need to obtain records for further outpatient management.   Bloody Diarrhea Bloody diarrhea can be caused by many different conditions. Most of the time bloody diarrhea is the result of food poisoning or minor infections. Bloody diarrhea usually improves over 2 to 3 days of rest and fluid replacement. Other conditions that can cause bloody diarrhea include:  Internal bleeding.  Infection.  Diseases of the bowel and colon. Internal bleeding from an ulcer or bowel disease can be severe and requires hospital care or even surgery. DIAGNOSIS  To find out what is wrong your caregiver may check your:  Stool.  Blood.  Results from a test that looks inside the body (endoscopy). TREATMENT   Get plenty of rest.  Drink enough water and fluids to keep your urine clear or pale yellow.  Do not smoke.  Solid foods and dairy products should be avoided until your illness improves.  As you improve, slowly return to a regular diet with easily-digested foods first. Examples are:  Bananas.  Rice.  Toast.  Crackers. You should only need these for about 2 days before adding more normal foods to your diet.  Avoid spicy or fatty foods as well as caffeine and alcohol for several days.  Medicine to control cramping and diarrhea can relieve symptoms but may prolong some cases of bloody diarrhea. Antibiotics can speed recovery from diarrhea due to some bacterial infections. Call your caregiver if diarrhea does not get better in 3 days. SEEK MEDICAL CARE IF:   You do not improve after 3 days.  Your diarrhea improves but your stool appears black. SEEK IMMEDIATE MEDICAL CARE IF:   You become extremely weak or faint.  You become very sweaty.  You have increased pain or  bleeding.  You develop repeated vomiting.  You vomit and you see blood or the vomit looks black in color.  You have a fever. Document Released: 08/19/2005 Document Revised: 11/11/2011 Document Reviewed: 07/21/2009 [facility] Patient Information 2015 [place], [place]. This information is not intended to replace advice given to you by your health care provider. Make sure you discuss any questions you have with your health care provider.    Emergency Department Resource Guide 1) Find a Doctor and Pay Out of Pocket Although you won't have to find out who is covered by your insurance plan, it is a good idea to ask around and get recommendations. You will then need to call the office and see if the doctor you have chosen will accept you as a new patient and what types of options they offer for patients who are self-pay. Some doctors offer discounts or will set up payment plans for their patients who do not have insurance, but you will need to ask so you aren't surprised when you get to your appointment.  2) Contact Your Local Health Department Not all health departments have doctors that can see patients for sick visits, but many do, so it is worth a call to see if yours does. If you don't know where your local health department is, you can check in your phone book. The CDC also has a tool to help you locate your state's health department, and many state websites also have listings of all of their local health departments.  3) Find a Walk-in  Clinic If your illness is not likely to be very severe or complicated, you may want to try a walk in clinic. These are popping up all over the country in pharmacies, drugstores, and shopping centers. They're usually staffed by nurse practitioners or physician assistants that have been trained to treat common illnesses and complaints. They're usually fairly quick and inexpensive. However, if you have serious medical issues or chronic medical problems, these are  probably not your best option.  No Primary Care Doctor: - Call Health Connect at  [phone] - they can help you locate a primary care doctor that  accepts your insurance, provides certain services, etc. - Physician Referral Service- [phone]  Chronic Pain Problems: Organization         Address  Phone   Notes  [facility]  [phone] Patients need to be referred by their primary care doctor.   Medication Assistance: Organization         Address  Phone   Notes  [facility] Medication [facility] [place] [place], [place] [address] [phone] --Must be a resident of [facility] -- Must have NO insurance coverage whatsoever (no Medicaid/ Medicare, etc.) -- The pt. MUST have a primary care doctor that directs their care regularly and follows them in the community   [facility]ist  [phone]   [employer]  [phone]    Agencies that provide inexpensive medical care: Organization         Address  Phone   Notes  [place]  [phone]   [facility]    [phone]   [facility] [place], [place] [address] [phone]   [place] [address], [place] [phone]   Planned Parenthood    [phone]   [facility]    [phone]   [place] [address], [place] Phone:  [phone], Fax:  [phone] Hours of Operation:  9 am - 6 pm, M-F.  Also accepts Medicaid/Medicare and self-pay.  [facility] [place], [address], [place] Phone: [phone], Fax: [phone]. Hours of Operation:  8:30 am - 5:30 pm, M-F.  Also accepts Medicaid and self-pay.  [facility] [place] [address], [place] Phone: [phone]   [place], [place], [place] [phone], Ext. [phone] Mondays & Thursdays:  7-9 AM.  First 15 patients are seen on a first come, first serve basis.    [place] Providers:  Organization         Address  Phone   Notes  [facility] [address], [place] [phone] Also accepts self-pay patients.  [facility] [address], [place]  [phone]   [place], [address], [place] [phone]   [facility] [address], [place] [phone]   Lucianne Lei [address], [place]   [phone] Only accepts [place] Access [place] patients after they have their name applied to their card.   Self-Pay (no insurance) in [facility]:  Organization         Address  Phone   Notes  Sickle Cell Patients, Investment banker, corporate Internal Medicine [place] (  [phone]   [facility] [place] [phone]   [facility] [place]  [place], [place], [place] [phone]   [facility]/Dr. Osei-Bonsu  [address], [place] or [address], [place] [phone] Phone number for both [place] and [place] locations is the same.  [facility] [address] [place] [phone]   [facility] [address], [place] or [address] [phone]   [facility] [address] [place] [phone], phone; [phone], fax Sees patients 1st and 3rd Saturday of every month.  Must not qualify for public or private insurance (i.e. Medicaid, Medicare, [place] Health Choice, Veterans' Benefits)  Household income should be no more than 200% of the poverty level The clinic cannot treat you if you are pregnant or think you are pregnant  Sexually transmitted diseases are not treated at the clinic.     Dental Care: Organization         Address  Phone  Notes  [facility] Department of [facility] [place] [phone] Accepts children up to age 62 who are enrolled in [place] or [place]; pregnant women with a Medicaid card; and children who have applied for Medicaid or [place] Health Choice, but were declined, whose parents can pay a reduced fee at time of service.  [facility] of [facility]  [address], [place] [phone] Accepts children up to age 33 who are enrolled in [place] or [place]; pregnant women with a Medicaid card; and children who have applied for Medicaid or [place] Health Choice, but were declined, whose parents can pay a reduced fee at time of service.  [place] [facility] PRO[facility]  [place] [phone] Patients are seen by appointment only. Walk-ins are not accepted. [place] will see patients 23 years of age and older. Monday - Tuesday (8am-5pm) Most Wednesdays (8:30-5pm) $30 per visit, cash only  [facility] Access PRO[facility]  [address], [facility] [phone] Patients are seen by appointment only. Walk-ins are not accepted. [place] will see patients 63 years of age and older. One Wednesday Evening (Monthly: Volunteer Based).  $30 per visit, cash only  [place]  [phone] for adults; Children under age 68, call Graduate Pediatric Dentistry at [phone]. Children aged 49-14, please call [phone] to request a pediatric application.  Dental services are provided in all areas of dental care including fillings, crowns and bridges, complete and partial dentures, implants, gum treatment, root canals, and extractions. Preventive care is also provided. Treatment is provided to both adults and children. Patients are selected via a lottery and there is often a waiting  list.   [facility] [address] [place], [place]  [phone] [email or web address]   [facility] [address] [place], [place] [phone], Ext. [phone] Second and Fourth Thursday of each month, opens at 6:30 AM; Clinic ends at 9 AM.  Patients are seen on a first-come first-served basis, and a limited number are seen during each clinic.   [facility]  [address] [place]ard D[place]er [place], [place] [phone]   Eligibility Requirements You must have lived in [place], [place], or [place] counties  for at least the last three months.   You cannot be eligible for state or federal sponsored [employer], including [employer], [place], or [employer].   You generally cannot be eligible for healthcare insurance through your employer.    How to apply: Eligibility screenings are held every Tuesday and Wednesday afternoon from 1:00 pm until 4:00 pm. You do not need an appointment for the interview!  [facility] [address], [place], [place]   [place]  [place] Department  [place]  [phone]    Behavioral Health Resources in the Community: Intensive Outpatient Programs Organization         Address  Phone  Notes  [place]. [address] [place], [place] [phone]   [facility] Outpatient [address] [place], [place]   ADS: Alcohol & Drug Svcs [address], [place], [place]   [place] [address],  [place], [place] or [phone]   Substance Abuse Resources Organization         Address  Phone  Notes  Alcohol and Drug Services  [phone]   [place]  [phone]   [facility] [place]   Chinita Pester  [phone]   Residential & Outpatient Substance Abuse Program   [phone]   Psychological Services Organization         Address  Phone  Notes  [facility] [place]  [place]  [phone]   Agency [address] [place] or [phone]    Mobile Crisis Teams Organization         Address  Phone  Notes  [facility], Mobile Crisis Care Unit  [phone]   [facility]  [address] [place], [place]   [place] Levels [address] [place] [phone]    Self-Help/Support Groups Organization         Address  Phone             Notes  [place]. of [place] - variety of support groups  [place] Call for more information  Narcotics Anonymous (NA), Caring Services [address], [employer] [place]  2 meetings at this location   Special educational needs teacher         Address  Phone  Notes  [facility] Residential Treatment [place],    [place]  [phone]   [facility]  [address], [place] [id number], [place], [place]   [place], [place] [phone] Admissions: 8am-3pm M-F  Incentives Substance [place] [address],    [place], [place] [phone]   [facility] [address] [place], [place], [place]   [facility] [address],  [place], [place]   Insight Programs - Intensive Outpatient [address] [place], [place], [place]   [facility] ([place].) [place].,  [place], [place] or [phone]   [facility]idential Treatment Services (RTS) [address] [place], [place] Accepts Medicaid  [place] [address],  [place] [phone] Substance Abuse/Addiction Treatment   [facility] Resources Organization          Address  Phone  Notes  Lexicographer Services  (  [phone]   Domenic Schwab, PhD [address] [place]lis Porta [place], [place]   [phone] or [phone]   [place], [place] [phone]   [address], [place], [place] [phone] Insurance/Medicaid/sponsorship through [facility] and Families [address]                                    [place], [place] [phone] [place] [address], [place] [phone]    Dr. Adele Schilder  [phone]   [facility] Dept. 1) [address], [place] 2) [place] 3)  [place] [address], [place] [phone]  [phone]   [place] [phone] or [phone] (After Hours)

## 2014-05-09 NOTE — ED Provider Notes (Signed)
Medical screening examination/treatment/procedure(s) were performed by non-physician practitioner and as supervising physician I was immediately available for consultation/collaboration.   EKG Interpretation None       Jazlen Ogarro R. Richmond Coldren, MD 05/09/14 2317 

## 2014-05-09 NOTE — ED Provider Notes (Signed)
CSN: [id number]     Arrival date & time 05/09/14  1724 History   First MD Initiated Contact with Patient 05/09/14 1948     Chief Complaint  Patient presents with  . Rectal Bleeding     (Consider location/radiation/quality/duration/timing/severity/associated sxs/prior Treatment) HPI  Robin Lopez is a 33 y.o. female complaining of bloody diarrhea onset today associated with cramping. Patient states that she frequently gets a couple episodes of diarrhea however they're usually not bloody, where there is very scant amount of blood. States that sometimes she has normal bowel movements with no issue and then she has several days of frequent diarrhea. Patient denies sick contacts, fever, chills, nausea, vomiting, sick contacts, pain with defecation, dysuria, hematuria or abnormal vaginal discharge. Patient also denies any recent travel, camping or recent antibiotic use. Patient is in no pain at this time.   History reviewed. No pertinent past medical history. Past Surgical History  Procedure Laterality Date  . Cholecystectomy    . Cesarean section    . Tubal ligation     History reviewed. No pertinent family history. History  Substance Use Topics  . Smoking status: Never Smoker   . Smokeless tobacco: Never Used  . Alcohol Use: No   OB History   Grav Para Term Preterm Abortions TAB SAB Ect Mult Living   2 2 2       2      Review of Systems  10 systems reviewed and found to be negative, except as noted in the HPI.   Allergies  Prednisone  Home Medications   Prior to Admission medications   Not on File   BP 142/78  Pulse 74  Temp(Src) 98.4 F (36.9 C) (Oral)  Resp 18  SpO2 100%  LMP 04/16/2014 Physical Exam  Nursing note and vitals reviewed. Constitutional: She is oriented to person, place, and time. She appears well-developed and well-nourished. No distress.  HENT:  Head: Normocephalic.  Mouth/Throat: Oropharynx is clear and moist.  Eyes: Conjunctivae and EOM  are normal. Pupils are equal, round, and reactive to light.  Neck: Normal range of motion.  Cardiovascular: Normal rate, regular rhythm and intact distal pulses.   Pulmonary/Chest: Effort normal and breath sounds normal. No stridor. No respiratory distress. She has no wheezes. She has no rales. She exhibits no tenderness.  Abdominal: Soft. Bowel sounds are normal. She exhibits no distension and no mass. There is no tenderness. There is no rebound and no guarding.  Genitourinary:  Rectal exam is chaperoned by technician:  No fissures. There is a small external hemorrhoid. Normal rectal tone. Normal stool color with bright red blood.  Musculoskeletal: Normal range of motion.  Neurological: She is alert and oriented to person, place, and time.  Psychiatric: She has a normal mood and affect.    ED Course  Procedures (including critical care time) Labs Review Labs Reviewed  CBC WITH DIFFERENTIAL - Abnormal; Notable for the following:    WBC 13.1 (*)    HCT 35.4 (*)    Platelets 403 (*)    Neutro Abs 8.7 (*)    All other components within normal limits  COMPREHENSIVE METABOLIC PANEL - Abnormal; Notable for the following:    CO2 17 (*)    AST 43 (*)    ALT 37 (*)    Alkaline Phosphatase 125 (*)    Anion gap 18 (*)    All other components within normal limits  URINALYSIS, ROUTINE W REFLEX MICROSCOPIC - Abnormal; Notable for the following:  Color, Urine AMBER (*)    APPearance CLOUDY (*)    Hgb urine dipstick TRACE (*)    All other components within normal limits  URINE MICROSCOPIC-ADD ON - Abnormal; Notable for the following:    Crystals CA OXALATE CRYSTALS (*)    All other components within normal limits  POC OCCULT BLOOD, ED - Abnormal; Notable for the following:    Fecal Occult Bld POSITIVE (*)    All other components within normal limits  POC URINE PREG, ED    Imaging Review No results found.   EKG Interpretation None      MDM   Final diagnoses:  Blood in stool     Filed Vitals:   05/09/14 1736 05/09/14 2133  BP: 126/83 142/78  Pulse: 95 74  Temp: 98.4 F (36.9 C)   TempSrc: Oral   Resp: 16 18  SpO2: 99% 100%    Robin Lopez is a 33 y.o. female presenting with bloody diarrhea. Patient's been having similar episodes for over a year. Abdominal exam is benign. Patient afebrile with no systemic signs of infection. Likely related to inflammatory bowel and ulcerative colitis. Patient will be given GI referral. Extensive discussion of return precautions.  Evaluation does not show pathology that would require ongoing emergent intervention or inpatient treatment. Pt is hemodynamically stable and mentating appropriately. Discussed findings and plan with patient/guardian, who agrees with care plan. All questions answered. Return precautions discussed and outpatient follow up given.    Monico Blitz, PA-C 05/09/14 2241

## 2014-05-09 NOTE — ED Notes (Signed)
Pt reports stool issues x1 year, with increased diarrhea. Has not been to a doctor about this. Today pt has had several bloody bowel movements. Denies pain. Denies anal sex.

## 2014-07-04 ENCOUNTER — Encounter (HOSPITAL_COMMUNITY): Payer: Self-pay | Admitting: Emergency Medicine

## 2015-04-30 ENCOUNTER — Emergency Department (HOSPITAL_COMMUNITY)
Admission: EM | Admit: 2015-04-30 | Discharge: 2015-04-30 | Disposition: A | Discharge status: Home / Self Care | Payer: Self-pay | Attending: Emergency Medicine | Admitting: Emergency Medicine

## 2015-04-30 ENCOUNTER — Encounter (HOSPITAL_COMMUNITY): Payer: Self-pay

## 2015-04-30 ENCOUNTER — Emergency Department (HOSPITAL_COMMUNITY): Payer: Self-pay

## 2015-04-30 DIAGNOSIS — R0602 Shortness of breath: Secondary | ICD-10-CM | POA: Insufficient documentation

## 2015-04-30 DIAGNOSIS — R0789 Other chest pain: Secondary | ICD-10-CM

## 2015-04-30 DIAGNOSIS — R079 Chest pain, unspecified: Secondary | ICD-10-CM | POA: Insufficient documentation

## 2015-04-30 LAB — D-DIMER, QUANTITATIVE (NOT AT ARMC)

## 2015-04-30 LAB — BASIC METABOLIC PANEL
ANION GAP: 10 (ref 5–15)
BUN: 12 mg/dL (ref 6–20)
CHLORIDE: 104 mmol/L (ref 101–111)
CO2: 26 mmol/L (ref 22–32)
Calcium: 9.1 mg/dL (ref 8.9–10.3)
Creatinine, Ser: 0.71 mg/dL (ref 0.44–1.00)
GFR calc Af Amer: >60 mL/min (ref >60)
GFR calc non Af Amer: >60 mL/min (ref >60)
GLUCOSE: 94 mg/dL (ref 65–99)
POTASSIUM: 4 mmol/L (ref 3.5–5.1)
Sodium: 140 mmol/L (ref 135–145)

## 2015-04-30 LAB — CBC
HEMATOCRIT: 37.2 % (ref 36.0–46.0)
HEMOGLOBIN: 12.6 g/dL (ref 12.0–15.0)
MCH: 28.6 pg (ref 26.0–34.0)
MCHC: 33.9 g/dL (ref 30.0–36.0)
MCV: 84.5 fL (ref 78.0–100.0)
Platelets: 404 10*3/uL — ABNORMAL HIGH (ref 150–400)
RBC: 4.4 MIL/uL (ref 3.87–5.11)
RDW: 13.4 % (ref 11.5–15.5)
WBC: 12.6 10*3/uL — ABNORMAL HIGH (ref 4.0–10.5)

## 2015-04-30 LAB — I-STAT TROPONIN, ED: Troponin i, poc: 0 ng/mL (ref 0.00–0.08)

## 2015-04-30 MED ORDER — IBUPROFEN 800 MG PO TABS: 800.0000 mg | ORAL_TABLET | Freq: Three times a day (TID) | ORAL | Status: DC | PRN

## 2015-04-30 NOTE — ED Notes (Signed)
MD at bedside. 

## 2015-04-30 NOTE — ED Notes (Signed)
Pt with pain under left breast into shoulder blade.  States pain since Thursday.  Pt states shortness of breath.  No cough/congestion.  No cardiac history.

## 2015-04-30 NOTE — ED Notes (Signed)
Pt to XR

## 2015-04-30 NOTE — ED Provider Notes (Signed)
CSN: [id number]     Arrival date & time 04/30/15  1343 History   First MD Initiated Contact with Patient 04/30/15 1404     Chief Complaint  Patient presents with  . Chest Pain   HPI  Robin Lopez is a 34yo female presenting with chest pain. Pain has been present since Thursday (8/25) and has for the most part has been constant since then. Worse with cough, but denies pain is affected by movement or touch. Pain also worse when she is irritated or upset. Pain located under left breast and into left shoulder blade. Also notes shortness of breath and "feeling hot". Denies any injury to area. Denies fever. Denies cardiac history. Has never felt these symptoms before.  History reviewed. No pertinent past medical history. Past Surgical History  Procedure Laterality Date  . Cholecystectomy    . Cesarean section    . Tubal ligation     History reviewed. No pertinent family history. Social History  Substance Use Topics  . Smoking status: Never Smoker   . Smokeless tobacco: Never Used  . Alcohol Use: No   OB History    Gravida Para Term Preterm AB TAB SAB Ectopic Multiple Living   2 2 2       2      Review of Systems  Respiratory: Positive for shortness of breath.   Cardiovascular: Positive for chest pain.      Allergies  Prednisone  Home Medications   Prior to Admission medications   Medication Sig Start Date End Date Taking? Authorizing Provider  ibuprofen (ADVIL,MOTRIN) 200 MG tablet Take 400 mg by mouth every 4 (four) hours as needed for fever, headache, mild pain, moderate pain or cramping.   Yes Historical Provider, MD   BP 166/101 mmHg  Pulse 119  Temp(Src) 98.5 F (36.9 C) (Oral)  Resp 30  Ht 5\' 6"  (1.676 m)  Wt 230 lb (104.327 kg)  BMI 37.14 kg/m2  SpO2 100%  LMP 04/11/2015 Physical Exam  Constitutional: She is oriented to person, place, and time. She appears well-developed and well-nourished. No distress.  HENT:  Head: Normocephalic and atraumatic.   Cardiovascular: Normal rate and regular rhythm.  Exam reveals no gallop and no friction rub.   No murmur heard. Pulmonary/Chest: Effort normal and breath sounds normal. No respiratory distress. She has no wheezes. She has no rales.  Mild tenderness noted over rib under left breast  Abdominal: Soft. Bowel sounds are normal. She exhibits no distension. There is no tenderness.  Musculoskeletal: Normal range of motion. She exhibits no edema.  Neurological: She is alert and oriented to person, place, and time.  Skin: Skin is warm and dry. No rash noted.  Psychiatric: She has a normal mood and affect. Her behavior is normal.    ED Course  Procedures (including critical care time) Labs Review Labs Reviewed  CBC - Abnormal; Notable for the following:    WBC 12.6 (*)    Platelets 404 (*)    All other components within normal limits  BASIC METABOLIC PANEL  D-DIMER, QUANTITATIVE (NOT AT [facility])  Randolm Idol, ED    Imaging Review Dg Chest 2 View  04/30/2015   CLINICAL DATA:  Pain under left breast into left shoulder blade. Shortness of breath.  EXAM: CHEST  2 VIEW  COMPARISON:  10/06/2012  FINDINGS: Lungs are adequately inflated without consolidation or effusion. Cardiomediastinal silhouette is within normal. Bones soft tissues are within normal.  IMPRESSION: No active cardiopulmonary disease.   Electronically Signed  By: Marin Olp M.D.   On: 04/30/2015 14:23   I have personally reviewed and evaluated these images and lab results as part of my medical decision-making.   EKG Interpretation   Date/Time:  Sunday April 30 2015 13:50:43 EDT Ventricular Rate:  110 PR Interval:  173 QRS Duration: 93 QT Interval:  341 QTC Calculation: 461 R Axis:   55 Text Interpretation:  Sinus tachycardia No old tracing to compare  Confirmed by KNAPP  MD-J, JON ([id number]) on 04/30/2015 2:12:50 PM      MDM   Final diagnoses:  None  CBC with leukocytosis of 12.6 consistent with prior labs.  Troponin negative. BMP normal. CXR without acute cardiopulmonary disease. D dimer normal. Initial EKG with sinus tachycardia, resolved in ED. Given distribution of pain with tenderness over rib suspect pain is due to musculoskeletal etiology, however may also be associated with anxiety since worsens when irritated. Stable for discharge with PCP follow up. Recommend NSAIDs for pain.    [place], [place] 04/30/15 1534  Dorie Rank, MD 04/30/15 [phone]

## 2015-04-30 NOTE — Discharge Instructions (Signed)

## 2015-10-22 ENCOUNTER — Emergency Department (HOSPITAL_COMMUNITY): Payer: Self-pay

## 2015-10-22 ENCOUNTER — Encounter (HOSPITAL_COMMUNITY): Payer: Self-pay | Admitting: *Deleted

## 2015-10-22 ENCOUNTER — Emergency Department (HOSPITAL_COMMUNITY)
Admission: EM | Admit: 2015-10-22 | Discharge: 2015-10-22 | Disposition: A | Discharge status: Home / Self Care | Payer: Self-pay | Attending: Emergency Medicine | Admitting: Emergency Medicine

## 2015-10-22 DIAGNOSIS — B349 Viral infection, unspecified: Secondary | ICD-10-CM | POA: Insufficient documentation

## 2015-10-22 DIAGNOSIS — R079 Chest pain, unspecified: Secondary | ICD-10-CM | POA: Insufficient documentation

## 2015-10-22 LAB — INFLUENZA PANEL BY PCR (TYPE A & B)
H1N1FLUPCR: NOT DETECTED
INFLBPCR: POSITIVE — AB
Influenza A By PCR: NEGATIVE

## 2015-10-22 MED ORDER — BENZONATATE 100 MG PO CAPS: 100.0000 mg | ORAL_CAPSULE | Freq: Three times a day (TID) | ORAL | Status: DC

## 2015-10-22 NOTE — ED Notes (Signed)
Pt started with cough, CP with cough and DB, fever, body aches, weakness. States she has had PNA in the past.

## 2015-10-22 NOTE — ED Notes (Signed)
Pt reports cough, congestion, fever. Pt states that her son recently had the flu as well. Pt states that she has noticed some blood when she coughs as well.

## 2015-10-22 NOTE — ED Notes (Signed)
LAB to send kit.

## 2015-10-22 NOTE — ED Provider Notes (Signed)
CSN: [id number]     Arrival date & time 10/22/15  1227 History  By signing my name below, I, Robin Lopez, attest that this documentation has been prepared under the direction and in the presence of [employer], PA-C. Electronically Signed: Julien Lopez, ED Scribe. 10/22/2015. 2:41 PM.    Chief Complaint  Patient presents with  . Influenza     The history is provided by the patient. No language interpreter was used.   HPI Comments: Robin Lopez is a 35 y.o. female with no pertinent PMH who presents to the Emergency Department complaining of constant, gradually worsening flu-like symptoms onset two days ago. She has been having rhinorrhea, fever, generalized body aches, dry cough and chest pain secondary to her cough. Pt has been around her daughter who was diagnosed with the flu (type B) last week. Pt took some over the counter medication to alleviate her symptoms with minimal relief. She denies a hx of asthma. Pt is a non-smoker.   History reviewed. No pertinent past medical history. Past Surgical History  Procedure Laterality Date  . Cholecystectomy    . Cesarean section    . Tubal ligation     No family history on file. Social History  Substance Use Topics  . Smoking status: Never Smoker   . Smokeless tobacco: Never Used  . Alcohol Use: No   OB History    Gravida Para Term Preterm AB TAB SAB Ectopic Multiple Living   2 2 2       2      Review of Systems  Constitutional: Positive for fever. Negative for chills.  HENT: Positive for congestion and rhinorrhea. Negative for sore throat.   Eyes: Negative for visual disturbance.  Respiratory: Positive for cough. Negative for shortness of breath and wheezing.   Cardiovascular: Positive for chest pain.  Gastrointestinal: Negative for nausea, vomiting and abdominal pain.  Genitourinary: Negative for dysuria.  Musculoskeletal: Negative for back pain and neck pain.  Allergic/Immunologic: Negative for immunocompromised state.   Neurological: Negative for headaches.      Allergies  Prednisone  Home Medications   Prior to Admission medications   Medication Sig Start Date End Date Taking? Authorizing Provider  benzonatate (TESSALON) 100 MG capsule Take 1 capsule (100 mg total) by mouth every 8 (eight) hours. 10/22/15   Robin Almond Eniyah Eastmond, PA-C  ibuprofen (ADVIL,MOTRIN) 800 MG tablet Take 1 tablet (800 mg total) by mouth every 8 (eight) hours as needed. 04/30/15   [place], DO   Triage vitals: BP 153/91 mmHg  Pulse 94  Temp(Src) 98.2 F (36.8 C) (Oral)  Resp 18  Ht 5\' 7"  (1.702 m)  Wt 220 lb (99.791 kg)  BMI 34.45 kg/m2  SpO2 99% Physical Exam  Constitutional: She is oriented to person, place, and time. She appears well-developed and well-nourished. No distress.  HENT:  Head: Normocephalic and atraumatic.  Mouth/Throat: Oropharynx is clear and moist. No oropharyngeal exudate.  Neck: Normal range of motion. Neck supple.  Cardiovascular: Normal rate, regular rhythm and normal heart sounds.   Pulmonary/Chest: Effort normal and breath sounds normal. No respiratory distress. She has no wheezes. She has no rales.  Abdominal: Soft. She exhibits no distension. There is no tenderness.  Musculoskeletal: Normal range of motion. She exhibits no edema.  Lymphadenopathy:    She has no cervical adenopathy.  Neurological: She is alert and oriented to person, place, and time. No sensory deficit.  Skin: Skin is warm and dry. No rash noted.  Psychiatric: She  has a normal mood and affect. Her behavior is normal.  Nursing note and vitals reviewed.   ED Course  Procedures  DIAGNOSTIC STUDIES: Oxygen Saturation is 99% on RA, normal by my interpretation.  COORDINATION OF CARE:  2:41 PM Discussed treatment plan with pt at bedside and pt agreed to plan.  Labs Review Labs Reviewed  INFLUENZA PANEL BY PCR (TYPE A & B, H1N1)    Imaging Review Dg Chest 2 View  10/22/2015  CLINICAL DATA:  Cough, chills,  fatigue and fever beginning yesterday. Initial encounter. EXAM: CHEST  2 VIEW COMPARISON:  PA and lateral chest 04/30/2015. FINDINGS: The lungs are clear. Heart size is normal. There is no pneumothorax or pleural effusion. No focal bony abnormality. IMPRESSION: Negative chest. Electronically Signed   By: Inge Rise M.D.   On: 10/22/2015 14:32   I have personally reviewed and evaluated these images and lab results as part of my medical decision-making.   EKG Interpretation None      MDM   Final diagnoses:  Viral syndrome   Robin Lopez is afebrile, non-toxic appearing with a clear lung exam. CXR with no acute cardiopulmonary disease. 54 year old child with + flu at pediatrician's - patient now with similar symptoms. Discussed options of sending flu PCR - patient states she would like to know for sure if this is the flu - will obtain swab. Patient is agreeable to symptomatic treatment with close follow up with PCP as needed but spoke at length about emergent, changing, or worsening of symptoms that should prompt return to ER. Patient voices understanding and is agreeable to plan.   Blood pressure 153/91, pulse 94, temperature 98.2 F (36.8 C), temperature source Oral, resp. rate 18, height 5\' 7"  (1.702 m), weight 99.791 kg, last menstrual period 09/12/2015, SpO2 99 %.  I personally performed the services described in this documentation, which was scribed in my presence. The recorded information has been reviewed and is accurate.  [facility] Robin Speas, PA-C 10/22/15 1504  Robin Shanks, MD 10/22/15 [phone]

## 2015-10-22 NOTE — Discharge Instructions (Signed)
1. Medications: tessalon for cough, usual home medications 2. Treatment: rest, drink plenty of fluids, take tylenol or ibuprofen for fever control 3. Follow Up: Please follow up with your primary doctor in 3 days for discussion of your diagnoses and further evaluation after today's visit;  if you do not have a primary care doctor use the resource guide provided to find one; Return to the ER for high fevers, difficulty breathing or other concerning symptoms  You have been tested for the flu - You will be called only if results are positive.

## 2016-08-21 ENCOUNTER — Emergency Department (HOSPITAL_COMMUNITY)
Admission: EM | Admit: 2016-08-21 | Discharge: 2016-08-21 | Disposition: A | Discharge status: Home / Self Care | Payer: BLUE CROSS/BLUE SHIELD | Attending: Emergency Medicine | Admitting: Emergency Medicine

## 2016-08-21 ENCOUNTER — Encounter (HOSPITAL_COMMUNITY): Payer: Self-pay | Admitting: Emergency Medicine

## 2016-08-21 DIAGNOSIS — L0291 Cutaneous abscess, unspecified: Secondary | ICD-10-CM

## 2016-08-21 DIAGNOSIS — L0201 Cutaneous abscess of face: Secondary | ICD-10-CM | POA: Insufficient documentation

## 2016-08-21 MED ORDER — CEPHALEXIN 500 MG PO CAPS: 500.0000 mg | ORAL_CAPSULE | Freq: Two times a day (BID) | ORAL | 0 refills | Status: DC

## 2016-08-21 NOTE — ED Triage Notes (Signed)
Pt c/o swelling and tenderness at the site of an abscess on r/side of chin. Pt reports small amount of bloody drainage. Swelling has increased from  A "pimple" 4 days ago to large, 2 cm , tender swollen abscess.

## 2016-08-21 NOTE — Discharge Instructions (Signed)
Take your antibiotic as prescribed until completed. I also recommend applying warm compresses to the area 3-4 times daily for 15-20 minutes. You may then try to gently massage the area to see if you are able to expel any drainage. You may also take Ibuprofen 600mg  every 6 hours as needed for pain relief. Return to the ED in 2 days if your wound have not improved or has worsened. Please return to the Emergency Department if symptoms worsen or new onset of fever, unable to open your jaw, difficulty swallowing, facial/neck swelling, difficulty breathing.

## 2016-08-21 NOTE — ED Provider Notes (Signed)
[facility] DEPT Provider Note   CSN: [id number] Arrival date & time: 08/21/16  1052  By signing my name below, I, [name], attest that this documentation has been prepared under the direction and in the presence of Harlene Ramus, [place]. Electronically Signed: [name], ED Scribe. 08/21/2016. 11:32 AM.    History   Chief Complaint Chief Complaint  Patient presents with  . Abscess    The history is provided by the patient. No language interpreter was used.   HPI Comments: Robin Lopez is a 35 y.o. female who presents to the Emergency Department complaining of a moderate, gradually worsening area of pain and swelling to the chin x 2 days. Pt states pain is exacerbated with palpation and direct pressure, and reports small amount of purulent and bloody drainage from the area. Pt reports a known allergy to prednisone. No alleviating factors noted. Denies fever, vomiting, trismus, drooling, dysphagia, difficulty breathing. Denies hx of abscesses.    History reviewed. No pertinent past medical history.  There are no active problems to display for this patient.   Past Surgical History:  Procedure Laterality Date  . CESAREAN SECTION    . CHOLECYSTECTOMY    . TUBAL LIGATION    . WISDOM TOOTH EXTRACTION      OB History    Gravida Para Term Preterm AB Living   2 2 2     2    SAB TAB Ectopic Multiple Live Births                   Home Medications    Prior to Admission medications   Medication Sig Start Date End Date Taking? Authorizing Provider  benzonatate (TESSALON) 100 MG capsule Take 1 capsule (100 mg total) by mouth every 8 (eight) hours. 10/22/15   Ozella Almond [place], PA-C  cephALEXin (KEFLEX) 500 MG capsule Take 1 capsule (500 mg total) by mouth 2 (two) times daily. 08/21/16   Nona Dell, PA-C  ibuprofen (ADVIL,MOTRIN) 800 MG tablet Take 1 tablet (800 mg total) by mouth every 8 (eight) hours as needed. 04/30/15   Lorna Few, DO      Family History Family History  Problem Relation Age of Onset  . Hypertension Father     Social History Social History  Substance Use Topics  . Smoking status: Never Smoker  . Smokeless tobacco: Never Used  . Alcohol use No     Allergies   Prednisone   Review of Systems Review of Systems  Constitutional: Negative for fever.  Gastrointestinal: Negative for vomiting.  Skin: Positive for wound.     Physical Exam Updated Vital Signs BP 122/82 (BP Location: Left Arm)   Pulse 83   Temp 98.5 F (36.9 C) (Oral)   Resp 16   Wt 99.8 kg   LMP 07/11/2016 (Approximate) Comment: irregular  SpO2 99%   BMI 34.46 kg/m   Physical Exam  Constitutional: She is oriented to person, place, and time. She appears well-developed and well-nourished.  HENT:  Head: Normocephalic and atraumatic.  Mouth/Throat: Uvula is midline, oropharynx is clear and moist and mucous membranes are normal. No oral lesions. No trismus in the jaw. No uvula swelling. No oropharyngeal exudate, posterior oropharyngeal edema, posterior oropharyngeal erythema or tonsillar abscesses. No tonsillar exudate.  Eyes: Conjunctivae and EOM are normal. Right eye exhibits no discharge. Left eye exhibits no discharge. No scleral icterus.  Neck: Normal range of motion. Neck supple.  Cardiovascular: Normal rate.   Pulmonary/Chest: Effort normal.  Neurological: She is alert and oriented to person, place, and time.  Skin: Skin is warm and dry.  2x1cm area of mild swelling and induration noted to the right chin with mild surrounding induration, erythema present. No fluctuance or drainage. Mild TTP.   Nursing note and vitals reviewed.    ED Treatments / Results  DIAGNOSTIC STUDIES:  Oxygen Saturation is 100% on RA, normal by my interpretation.    COORDINATION OF CARE:  11:32 AM Discussed treatment plan with pt at bedside and pt agreed to plan.   Labs (all labs ordered are listed, but only abnormal results are  displayed) Labs Reviewed - No data to display  EKG  EKG Interpretation None       Radiology No results found.  Procedures Procedures (including critical care time)  Medications Ordered in ED Medications - No data to display   Initial Impression / Assessment and Plan / ED Course  I have reviewed the triage vital signs and the nursing notes.  Pertinent labs & imaging results that were available during my care of the patient were reviewed by me and considered in my medical decision making (see chart for details).  Clinical Course     Patient with small area of swelling, erythema and induration noted to chin. No palpable fluctuance or drainage present. Area does not appear to contain fluid which would warrant I&D at this time. Discussed option to attempt to I&D area, however due to area being present on pt's face, she requested trial of antibiotics and symptomatic tx initially.  Plan to d/c pt home with Kelflex. Encouraged home warm soaks and flushing.  Advised pt to return to the ED in 2 days for wound recheck. Discussed strict return precautions.   Final Clinical Impressions(s) / ED Diagnoses   Final diagnoses:  Abscess    New Prescriptions Discharge Medication List as of 08/21/2016 11:38 AM    START taking these medications   Details  cephALEXin (KEFLEX) 500 MG capsule Take 1 capsule (500 mg total) by mouth 2 (two) times daily., Starting Wed 08/21/2016, Print      I personally performed the services described in this documentation, which was scribed in my presence. The recorded information has been reviewed and is accurate.     Chesley Noon [place], [place] 08/21/16 Jack, MD 08/22/16 [phone]

## 2016-08-28 ENCOUNTER — Emergency Department (HOSPITAL_COMMUNITY)
Admission: EM | Admit: 2016-08-28 | Discharge: 2016-08-28 | Disposition: A | Discharge status: Home / Self Care | Payer: Self-pay | Attending: Emergency Medicine | Admitting: Emergency Medicine

## 2016-08-28 ENCOUNTER — Emergency Department (HOSPITAL_COMMUNITY): Payer: Self-pay

## 2016-08-28 ENCOUNTER — Encounter (HOSPITAL_COMMUNITY): Payer: Self-pay

## 2016-08-28 DIAGNOSIS — R110 Nausea: Secondary | ICD-10-CM

## 2016-08-28 DIAGNOSIS — R197 Diarrhea, unspecified: Secondary | ICD-10-CM

## 2016-08-28 LAB — COMPREHENSIVE METABOLIC PANEL
ALT: 20 U/L (ref 14–54)
AST: 22 U/L (ref 15–41)
Albumin: 4.4 g/dL (ref 3.5–5.0)
Alkaline Phosphatase: 138 U/L — ABNORMAL HIGH (ref 38–126)
Anion gap: 10 (ref 5–15)
BUN: 14 mg/dL (ref 6–20)
CO2: 21 mmol/L — ABNORMAL LOW (ref 22–32)
Calcium: 9.1 mg/dL (ref 8.9–10.3)
Chloride: 107 mmol/L (ref 101–111)
Creatinine, Ser: 0.71 mg/dL (ref 0.44–1.00)
GFR calc Af Amer: >60 mL/min (ref >60)
GFR calc non Af Amer: >60 mL/min (ref >60)
Glucose, Bld: 109 mg/dL — ABNORMAL HIGH (ref 65–99)
Potassium: 4.2 mmol/L (ref 3.5–5.1)
Sodium: 138 mmol/L (ref 135–145)
Total Bilirubin: 0.4 mg/dL (ref 0.3–1.2)
Total Protein: 7.8 g/dL (ref 6.5–8.1)

## 2016-08-28 LAB — CBC
HCT: 32.6 % — ABNORMAL LOW (ref 36.0–46.0)
HEMOGLOBIN: 10.7 g/dL — AB (ref 12.0–15.0)
MCH: 24.1 pg — ABNORMAL LOW (ref 26.0–34.0)
MCHC: 32.8 g/dL (ref 30.0–36.0)
MCV: 73.4 fL — AB (ref 78.0–100.0)
PLATELETS: 427 10*3/uL — AB (ref 150–400)
RBC: 4.44 MIL/uL (ref 3.87–5.11)
RDW: 16.8 % — ABNORMAL HIGH (ref 11.5–15.5)
WBC: 11.1 10*3/uL — AB (ref 4.0–10.5)

## 2016-08-28 LAB — URINALYSIS, ROUTINE W REFLEX MICROSCOPIC
BILIRUBIN URINE: NEGATIVE
GLUCOSE, UA: NEGATIVE mg/dL
KETONES UR: NEGATIVE mg/dL
LEUKOCYTES UA: NEGATIVE
Nitrite: NEGATIVE
PH: 5 (ref 5.0–8.0)
Protein, ur: NEGATIVE mg/dL
Specific Gravity, Urine: 1.015 (ref 1.005–1.030)

## 2016-08-28 LAB — LIPASE, BLOOD: LIPASE: 24 U/L (ref 11–51)

## 2016-08-28 MED ORDER — ONDANSETRON HCL 4 MG/2ML IJ SOLN
4.0000 mg | Freq: Once | INTRAMUSCULAR | Status: AC
  Administered 2016-08-28: 4 mg via INTRAVENOUS
  Filled 2016-08-28: qty 2

## 2016-08-28 MED ORDER — ONDANSETRON HCL 4 MG PO TABS: 4.0000 mg | ORAL_TABLET | Freq: Four times a day (QID) | ORAL | 0 refills | Status: DC

## 2016-08-28 MED ORDER — SODIUM CHLORIDE 0.9 % IV BOLUS (SEPSIS)
1000.0000 mL | Freq: Once | INTRAVENOUS | Status: AC
  Administered 2016-08-28: 1000 mL via INTRAVENOUS

## 2016-08-28 NOTE — ED Notes (Signed)
Patient is A & O x4.  She understood discharge instructions. 

## 2016-08-28 NOTE — ED Triage Notes (Signed)
Patient reports that was prescribed an antibiotic for an abscess on her chin on 08/22/16 and on 08/25/16 states she was having cold symptoms and today she has N/v/D and joint pain.

## 2016-08-28 NOTE — ED Provider Notes (Signed)
[place] [facility] DEPT Provider Note   CSN: [id number] Arrival date & time: 08/28/16  [id number]     History   Chief Complaint Chief Complaint  Patient presents with  . Diarrhea  . Emesis  . Joint Pain    HPI Robin Lopez is a 35 y.o. female with no significant past medical history presenting to the ED with a few days of diarrhea, nausea, myalgias, fever/chills, and cough. The patient was recently seen in the emergency department for any chin abscess. She was sent home with cephalexin and states that 2 days later she started feeling worse, had myalgias and felt as though she was getting the flu. A few days later she had nonbloody diarrhea and has not been able to take anything by mouth all day yesterday and today. The abscess on her chin has been draining well. she's been using warm compresses and it has been getting smaller. She denies productive cough, chest pain, shortness of breath, dysuria. LMP: Last day today.  HPI  History reviewed. No pertinent past medical history.  There are no active problems to display for this patient.   Past Surgical History:  Procedure Laterality Date  . CESAREAN SECTION    . CHOLECYSTECTOMY    . TUBAL LIGATION    . WISDOM TOOTH EXTRACTION      OB History    Gravida Para Term Preterm AB Living   2 2 2     2    SAB TAB Ectopic Multiple Live Births                   Home Medications    Prior to Admission medications   Medication Sig Start Date End Date Taking? Authorizing Provider  cephALEXin (KEFLEX) 500 MG capsule Take 1 capsule (500 mg total) by mouth 2 (two) times daily. Patient not taking: Reported on 08/28/2016 08/21/16   Nona Dell, PA-C  ondansetron (ZOFRAN) 4 MG tablet Take 1 tablet (4 mg total) by mouth every 6 (six) hours. 08/28/16   Emeline General, PA-C    Family History Family History  Problem Relation Age of Onset  . Hypertension Father     Social History Social History  Substance Use Topics  .  Smoking status: Never Smoker  . Smokeless tobacco: Never Used  . Alcohol use No     Allergies   Prednisone   Review of Systems Review of Systems  Constitutional: Positive for fever. Negative for chills.       Reports objective fever of 101 at home  HENT: Negative for ear pain, sinus pain, sinus pressure, sore throat and trouble swallowing.   Eyes: Negative for pain and visual disturbance.  Respiratory: Positive for cough and wheezing. Negative for choking, chest tightness, shortness of breath and stridor.        Patient reports dry cough last 4 days and wheezing last night.  Cardiovascular: Negative for chest pain and palpitations.  Gastrointestinal: Positive for diarrhea and nausea. Negative for abdominal distention, abdominal pain, blood in stool and vomiting.       Endorses nausea but no vomiting. Nonbloody diarrhea. Abdominal cramping with the diarrhea  Genitourinary: Negative for difficulty urinating, dysuria, flank pain, hematuria and menstrual problem.  Musculoskeletal: Positive for myalgias. Negative for arthralgias, back pain, gait problem, neck pain and neck stiffness.  Skin: Negative for color change, pallor, rash and wound.  Neurological: Negative for dizziness, tremors, seizures, syncope, speech difficulty, weakness, light-headedness and numbness.  All other systems reviewed and are negative.  Physical Exam Updated Vital Signs BP 125/61   Pulse 66   Temp 98.3 F (36.8 C) (Oral)   Resp 16   Ht 5\' 7"  (1.702 m)   Wt 99.8 kg   LMP 08/28/2016 Comment: irregular  SpO2 100%   BMI 34.46 kg/m   Physical Exam  Constitutional: She appears well-developed and well-nourished. No distress.  Afebrile, nontoxic appearing sitting comfortably in bed in no acute distress.  HENT:  Head: Normocephalic and atraumatic.  Nose: Nose normal.  Mouth/Throat: Oropharynx is clear and moist. No oropharyngeal exudate.  Eyes: Conjunctivae and EOM are normal. Pupils are equal, round,  and reactive to light. Right eye exhibits no discharge. Left eye exhibits no discharge. No scleral icterus.  Neck: Normal range of motion. Neck supple.  Cardiovascular: Normal rate, regular rhythm and normal heart sounds.   No murmur heard. Pulmonary/Chest: Effort normal and breath sounds normal. No stridor. No respiratory distress. She has no wheezes. She has no rales. She exhibits no tenderness.  Abdominal: Soft. Bowel sounds are normal. She exhibits no distension and no mass. There is no rebound and no guarding.  Diffuse cramping discomfort with palpation but no focal tenderness. Negative Mcburney's point tenderness. No RUQ murphy's sign (patient had cholecystectomy). No rebound  Musculoskeletal: She exhibits no edema or tenderness.  Neurological: She is alert.  Skin: Skin is warm and dry. No rash noted. She is not diaphoretic. No erythema. No pallor.  Psychiatric: She has a normal mood and affect.  Nursing note and vitals reviewed.    ED Treatments / Results  Labs (all labs ordered are listed, but only abnormal results are displayed) Labs Reviewed  COMPREHENSIVE METABOLIC PANEL - Abnormal; Notable for the following:       Result Value   CO2 21 (*)    Glucose, Bld 109 (*)    Alkaline Phosphatase 138 (*)    All other components within normal limits  CBC - Abnormal; Notable for the following:    WBC 11.1 (*)    Hemoglobin 10.7 (*)    HCT 32.6 (*)    MCV 73.4 (*)    MCH 24.1 (*)    RDW 16.8 (*)    Platelets 427 (*)    All other components within normal limits  URINALYSIS, ROUTINE W REFLEX MICROSCOPIC - Abnormal; Notable for the following:    Hgb urine dipstick MODERATE (*)    Bacteria, UA FEW (*)    Squamous Epithelial / LPF 0-5 (*)    All other components within normal limits  C DIFFICILE QUICK SCREEN W PCR REFLEX  LIPASE, BLOOD    EKG  EKG Interpretation None       Radiology Dg Chest 2 View  Result Date: 08/28/2016 CLINICAL DATA:  Cough, congestion and  shortness of breath for 4 days. Left side chest pain. EXAM: CHEST  2 VIEW COMPARISON:  10/22/2015 FINDINGS: Heart and mediastinal contours are within normal limits. No focal opacities or effusions. No acute bony abnormality. IMPRESSION: No active cardiopulmonary disease. Electronically Signed   By: Rolm Baptise M.D.   On: 08/28/2016 11:56    Procedures Procedures (including critical care time)  Medications Ordered in ED Medications  ondansetron (ZOFRAN) injection 4 mg (4 mg Intravenous Given 08/28/16 1225)  sodium chloride 0.9 % bolus 1,000 mL (1,000 mLs Intravenous New Bag/Given 08/28/16 1225)     Initial Impression / Assessment and Plan / ED Course  I have reviewed the triage vital signs and the nursing notes.  Pertinent labs &  imaging results that were available during my care of the patient were reviewed by me and considered in my medical decision making (see chart for details).  Clinical Course    Otherwise healthy 35 year old female presenting to the ED with flu-like symptoms. Cough, myalgias, diarrhea, nausea but no vomiting. She has not been able to take anything PO for over 24 hours. She feels like she is having a hard time keeping water down.  Offered a trial of oral rehydration after anti-emetic, but patient preferred IV fluids as she doesn't think she can keep it down.. Will start 1L bolus, zofran and obtain chest xray  11:50 - Chest x-ray, Hydration 1L IV bolus, zofran  Assessed her abscess: it is less than 1cm2 of indurated erythema without purulence or drainage. Evidence of wound healing at the center with dried blood.  12:35 - CXR negative. No acute cardiopulmonary process noted.  Reassessed patient after fluids and zofran. She was feeling better. No pain and nausea subsided.  1:45 - PO trial successful. No longer nauseated. Patient states that she is ready to provide sample for C-diff testing and is ready to go home.  Patient was afebrile while in the emergency  department with antipyretics OTC 5 hours prior. She is well-appearing and in no acute distress. Blood pressure slightly elevated initially but resolved and no tachycardia. Patient is otherwise healthy. Although she just finished a course of antibiotics, onset of symptoms were co-commitant with cough, myalgias and also consistent with a viral infection. She also had a lower hgb at 10.7 from 12.6 which may be explained by her LMP ending today.  Will discharge home with symptomatic relief and close follow up with PCP for abscess recheck and follow up. Pending C-diff PCR. Advised to continue warm compress on her chin.  Discussed strict return precautions. Patient was advised to return to the emergency department if experiencing any worsening of symptoms, including fever/chills, intractable nausea and vomiting, dizziness or any other concerning symptoms. Patient understood instructions and agreed with discharge plan.  Patient was discussed with Dr. Lita Mains who agrees with assessment and plan.  Final Clinical Impressions(s) / ED Diagnoses   Final diagnoses:  Diarrhea, unspecified type  Nausea    New Prescriptions New Prescriptions   ONDANSETRON (ZOFRAN) 4 MG TABLET    Take 1 tablet (4 mg total) by mouth every 6 (six) hours.     Emeline General, PA-C 08/28/16 1405    Julianne Rice, MD 08/28/16 [phone]

## 2017-11-02 IMAGING — CR DG CHEST 2V
2 series · 2 of 2 positions shown · non-contrast
Comparison: 10/22/2015

CLINICAL DATA: Cough, congestion and shortness of breath for 4
days. Left side chest pain.

EXAM:
CHEST  2 VIEW

[w chest pa]
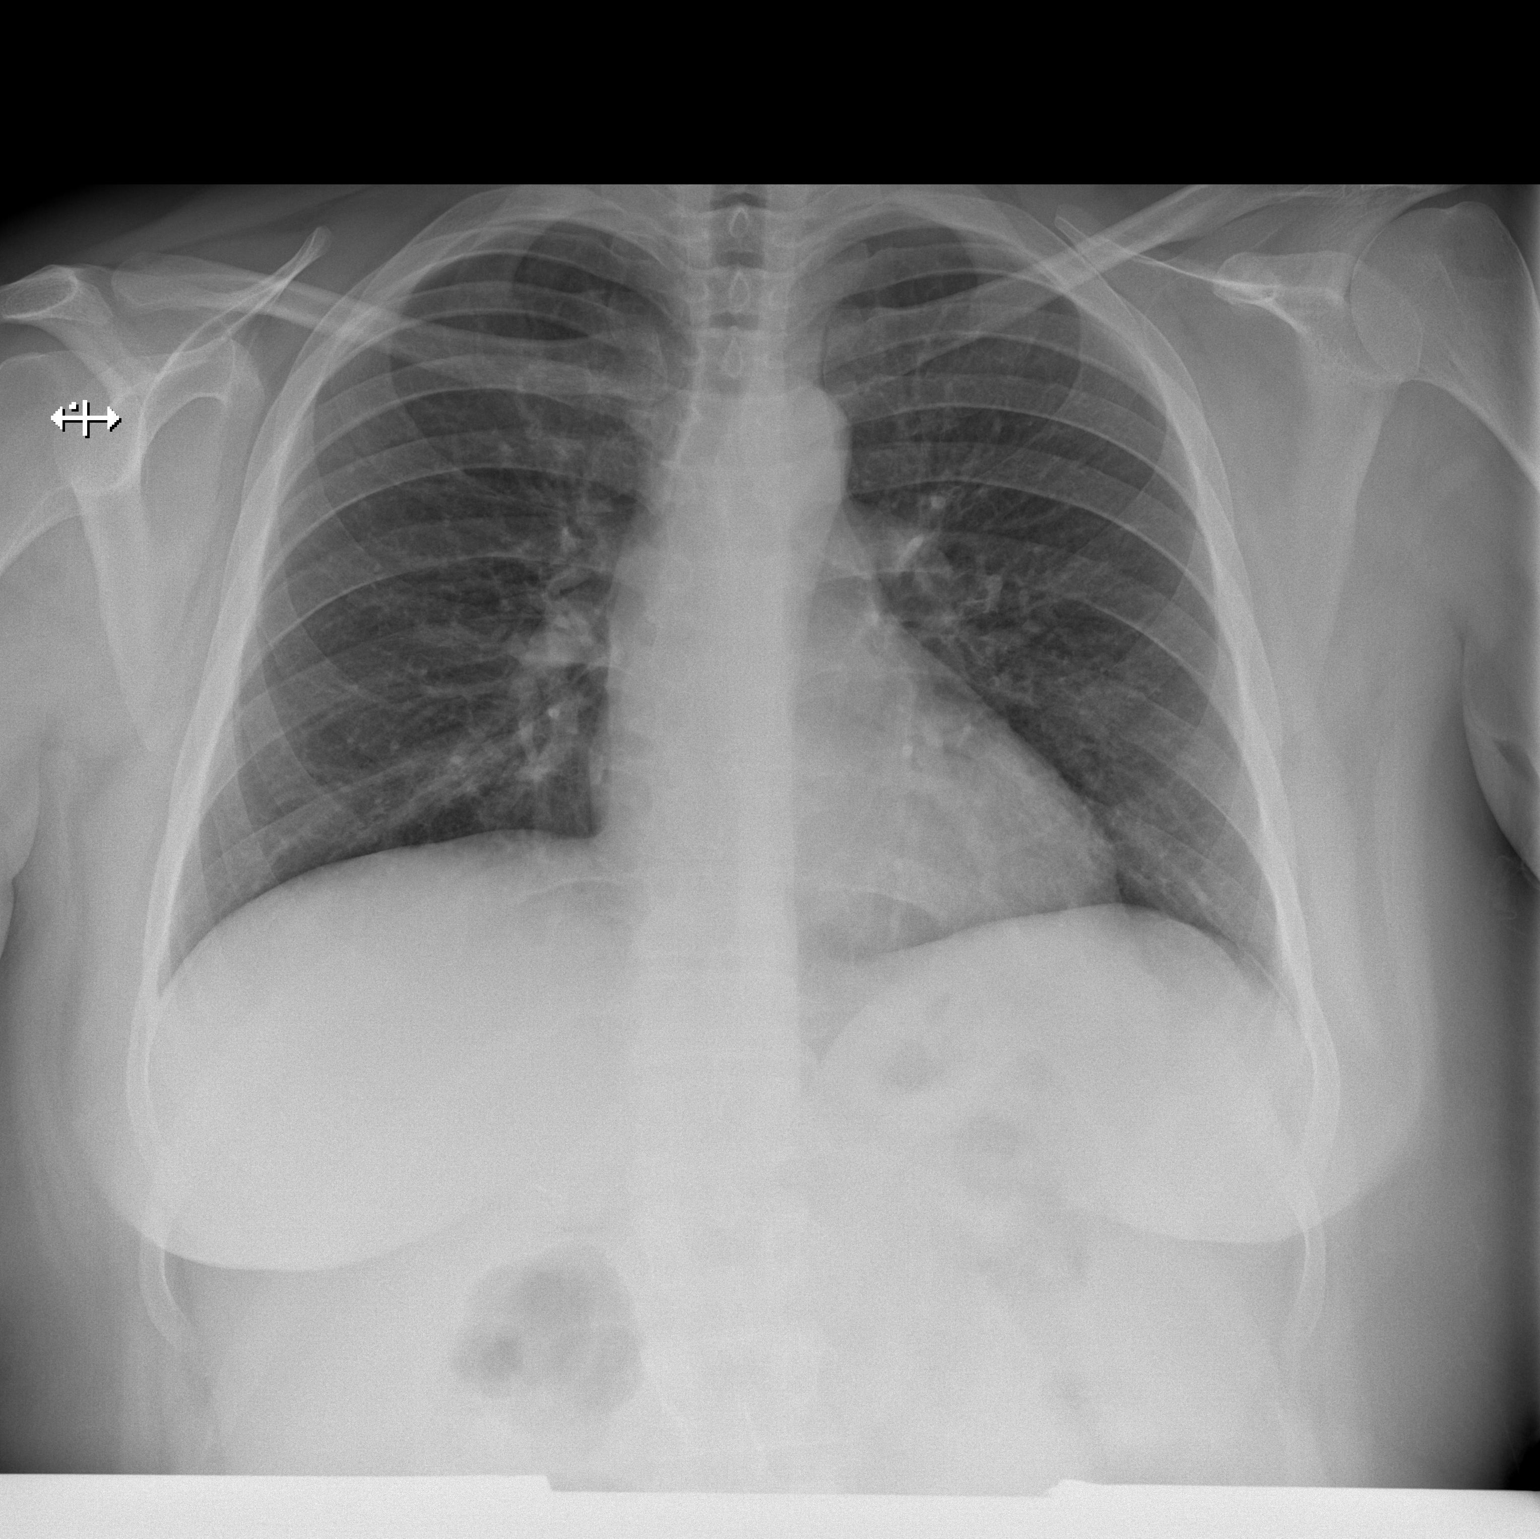

[w chest lat]
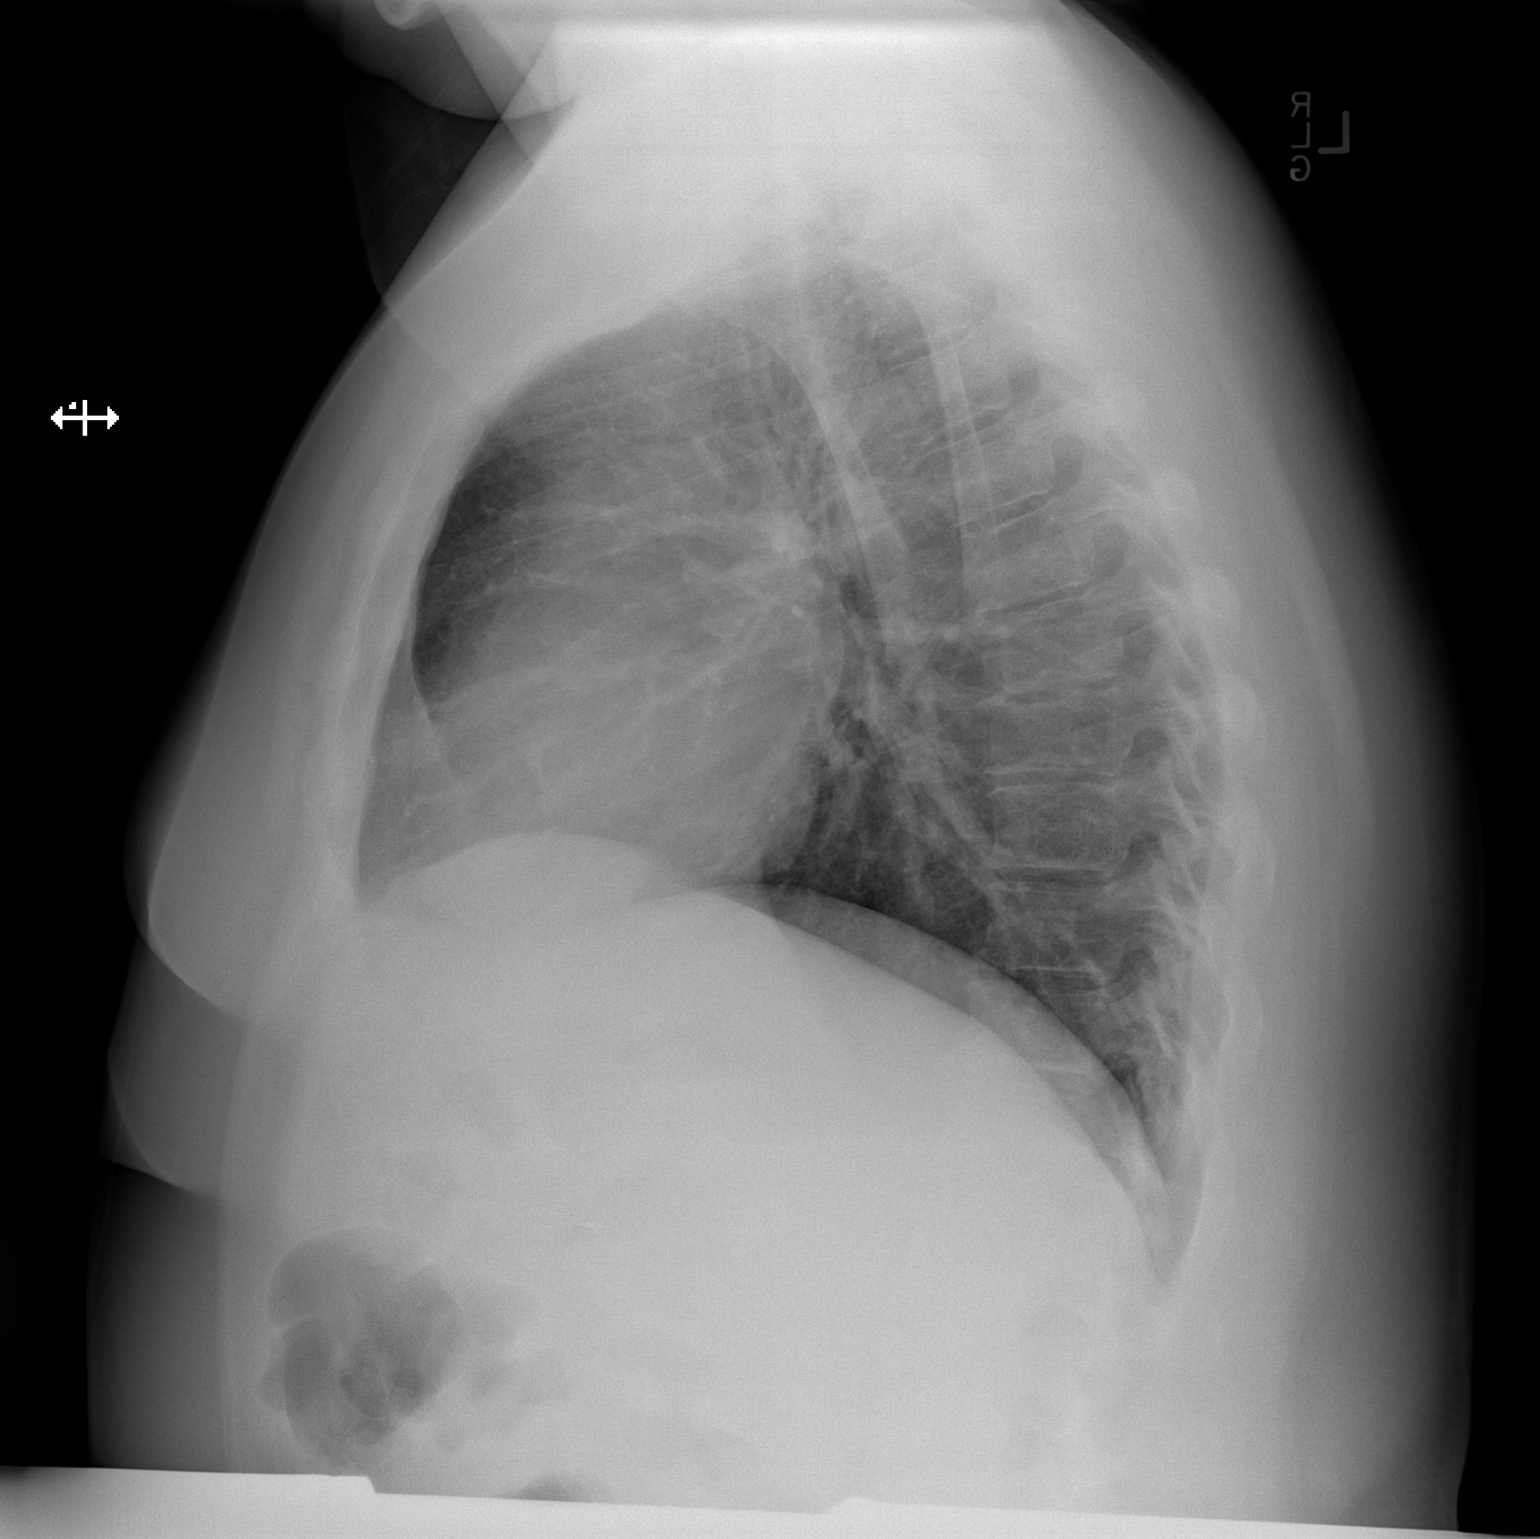

[2 of 2 positions shown; findings below may reference images not displayed]

FINDINGS: Heart and mediastinal contours are within normal limits. No focal
opacities or effusions. No acute bony abnormality.
IMPRESSION: No active cardiopulmonary disease.

## 2018-03-21 ENCOUNTER — Other Ambulatory Visit: Payer: Self-pay

## 2018-03-21 ENCOUNTER — Emergency Department
Admission: EM | Admit: 2018-03-21 | Discharge: 2018-03-21 | Disposition: A | Discharge status: Home / Self Care | Payer: Worker's Compensation | Attending: Emergency Medicine | Admitting: Emergency Medicine

## 2018-03-21 ENCOUNTER — Encounter: Payer: Self-pay | Admitting: Emergency Medicine

## 2018-03-21 ENCOUNTER — Emergency Department: Payer: Worker's Compensation

## 2018-03-21 DIAGNOSIS — Y929 Unspecified place or not applicable: Secondary | ICD-10-CM | POA: Diagnosis not present

## 2018-03-21 DIAGNOSIS — S6992XA Unspecified injury of left wrist, hand and finger(s), initial encounter: Secondary | ICD-10-CM | POA: Diagnosis present

## 2018-03-21 DIAGNOSIS — S62663A Nondisplaced fracture of distal phalanx of left middle finger, initial encounter for closed fracture: Secondary | ICD-10-CM | POA: Insufficient documentation

## 2018-03-21 DIAGNOSIS — T148XXA Other injury of unspecified body region, initial encounter: Secondary | ICD-10-CM

## 2018-03-21 DIAGNOSIS — Y990 Civilian activity done for income or pay: Secondary | ICD-10-CM | POA: Diagnosis not present

## 2018-03-21 DIAGNOSIS — W230XXA Caught, crushed, jammed, or pinched between moving objects, initial encounter: Secondary | ICD-10-CM | POA: Diagnosis not present

## 2018-03-21 DIAGNOSIS — S62665A Nondisplaced fracture of distal phalanx of left ring finger, initial encounter for closed fracture: Secondary | ICD-10-CM | POA: Diagnosis not present

## 2018-03-21 DIAGNOSIS — Y9389 Activity, other specified: Secondary | ICD-10-CM | POA: Diagnosis not present

## 2018-03-21 MED ORDER — HYDROCODONE-ACETAMINOPHEN 5-325 MG PO TABS: 1.0000 | ORAL_TABLET | Freq: Four times a day (QID) | ORAL | 0 refills | Status: DC | PRN

## 2018-03-21 MED ORDER — IBUPROFEN 800 MG PO TABS: 800.0000 mg | ORAL_TABLET | Freq: Three times a day (TID) | ORAL | 0 refills | Status: DC | PRN

## 2018-03-21 NOTE — ED Notes (Signed)
Patient presents to the ED with the ends of her 4th and 5th fingers on her left hand purple.  Patient states she shut them in the safe at work accidentally.  Patient states fingers are in pain.

## 2018-03-21 NOTE — ED Triage Notes (Addendum)
Pt at work shut safe door on fingers #3 and #4 left hand. Fingers bruised at tips. Pt able to move both. Pt took 800mg  ibuprofen 1610.

## 2018-03-21 NOTE — Discharge Instructions (Addendum)
Please keep fingertips protected with aluminum foam splint or purchase over-the-counter finger staxx splint.  Call orthopedic office Monday morning to schedule follow-up appointment.  Take ibuprofen as needed for mild to moderate pain.  Use Norco as needed for severe pain.

## 2018-03-21 NOTE — ED Notes (Signed)
Provider in room to assess patient.  Will continue to monitor.

## 2018-03-21 NOTE — ED Notes (Signed)
Patient transported to x-ray at this time.   

## 2018-03-21 NOTE — ED Provider Notes (Addendum)
[facility] EMERGENCY DEPARTMENT Provider Note   CSN: [id number] Arrival date & time: 03/21/18  1628     History   Chief Complaint Chief Complaint  Patient presents with  . Hand Injury    HPI Robin Lopez is a 37 y.o. female.  Presents to the emergency department for evaluation of left third and fourth digit injury.  Patient states around 2:30 PM today at work when she accidentally closed her left third and fourth digit into a heavy steel safe door.  Patient crush the tips of the third and fourth digits of her left hand.  She is right-hand dominant.  Pain was severe but improved with ibuprofen 800 mg at 4:30 PM.  She denies any numbness or tingling.  No injury to the nails.  She denies any limited range of motion of the DIP joints.  HPI  History reviewed. No pertinent past medical history.  There are no active problems to display for this patient.   Past Surgical History:  Procedure Laterality Date  . CESAREAN SECTION    . CHOLECYSTECTOMY    . TUBAL LIGATION    . WISDOM TOOTH EXTRACTION       OB History    Gravida  2   Para  2   Term  2   Preterm      AB      Living  2     SAB      TAB      Ectopic      Multiple      Live Births               Home Medications    Prior to Admission medications   Medication Sig Start Date End Date Taking? Authorizing Provider  cephALEXin (KEFLEX) 500 MG capsule Take 1 capsule (500 mg total) by mouth 2 (two) times daily. Patient not taking: Reported on 08/28/2016 08/21/16   Nona Dell, PA-C  HYDROcodone-acetaminophen [facility]) 5-325 MG tablet Take 1 tablet by mouth every 6 (six) hours as needed for moderate pain. 03/21/18   Duanne Guess, PA-C  ibuprofen (ADVIL,MOTRIN) 800 MG tablet Take 1 tablet (800 mg total) by mouth every 8 (eight) hours as needed. 03/21/18   Duanne Guess, PA-C  ondansetron (ZOFRAN) 4 MG tablet Take 1 tablet (4 mg total) by mouth every 6 (six) hours.  08/28/16   Emeline General, PA-C    Family History Family History  Problem Relation Age of Onset  . Hypertension Father     Social History Social History   Tobacco Use  . Smoking status: Never Smoker  . Smokeless tobacco: Never Used  Substance Use Topics  . Alcohol use: No  . Drug use: No     Allergies   Prednisone   Review of Systems Review of Systems  Constitutional: Negative for fever.  Respiratory: Negative for shortness of breath.   Cardiovascular: Negative for chest pain.  Musculoskeletal: Positive for arthralgias and joint swelling. Negative for back pain and myalgias.  Skin: Negative for rash.  Neurological: Negative for dizziness, light-headedness, numbness and headaches.     Physical Exam Updated Vital Signs BP (!) 149/88 (BP Location: Right Arm)   Pulse 74   Temp 98.1 F (36.7 C) (Oral)   Ht 5\' 6"  (1.676 m)   Wt 93 kg (205 lb)   LMP 03/17/2018   SpO2 100%   BMI 33.09 kg/m   Physical Exam  Constitutional: She is oriented to person, place,  and time. She appears well-developed and well-nourished.  HENT:  Head: Normocephalic and atraumatic.  Eyes: Conjunctivae are normal.  Neck: Normal range of motion.  Cardiovascular: Normal rate.  Pulmonary/Chest: Effort normal. No respiratory distress.  Musculoskeletal: Normal range of motion.  Examination left hand shows swelling to the tips of the left third and fourth digits.  There is no skin breakdown noted.  No nail injury.  No subungual hematoma.  Patient has full active extension of the DIP joints and she has intact flexion of the DIP joints.  Sensation is intact distally.  Neurological: She is alert and oriented to person, place, and time.  Skin: Skin is warm. No rash noted.  Psychiatric: She has a normal mood and affect. Her behavior is normal. Thought content normal.     ED Treatments / Results  Labs (all labs ordered are listed, but only abnormal results are displayed) Labs Reviewed - No  data to display  EKG None  Radiology Dg Hand Complete Left  Result Date: 03/21/2018 CLINICAL DATA:  Closed left distal middle and rings fingers in large safe. EXAM: LEFT HAND - COMPLETE 3+ VIEW COMPARISON:  None. FINDINGS: There is a fracture through the tuft of the distal phalanx fourth digit. Potential nondisplaced vertical fracture through the tuft of the third digit. No dislocation. IMPRESSION: 1. Minimally displaced fracture of the tuft of the fourth digit, distal phalanx 2. Potential nondisplaced fracture of the tuft of the third digit, distal phalanx Electronically Signed   By: Suzy Bouchard M.D.   On: 03/21/2018 17:18    Procedures .Splint Application Date/Time: 1/76/1607 5:30 PM Performed by: Duanne Guess, PA-C Authorized by: Duanne Guess, PA-C   Consent:    Consent obtained:  Verbal   Consent given by:  Patient   Risks discussed:  Discoloration and numbness   Alternatives discussed:  No treatment Pre-procedure details:    Sensation:  Normal Procedure details:    Laterality:  Left   Location:  Finger   Finger:  L ring finger and L long finger   Strapping: no     Supplies:  Aluminum splint Post-procedure details:    Patient tolerance of procedure:  Tolerated well, no immediate complications   (including critical care time)  Medications Ordered in ED Medications - No data to display   Initial Impression / Assessment and Plan / ED Course  I have reviewed the triage vital signs and the nursing notes.  Pertinent labs & imaging results that were available during my care of the patient were reviewed by me and considered in my medical decision making (see chart for details).     37 year old female with crush injury to the left third and fourth distal phalanx with x-rays ordered and reviewed by me showing nondisplaced tuft fractures.  No subungual hematoma no evidence of tendon deficits.  Patient is placed into finger splints to help alleviate pain and protect  the digits from further trauma.  Patient will start ibuprofen as she is given prescription for Norco as needed for severe pain.  She will follow-up with orthopedics for recheck.  She is given a note for light duty restrictions.  Final Clinical Impressions(s) / ED Diagnoses   Final diagnoses:  Closed nondisplaced fracture of distal phalanx of left middle finger, initial encounter  Closed nondisplaced fracture of distal phalanx of left ring finger, initial encounter  Crush injury    ED Discharge Orders        Ordered    ibuprofen (ADVIL,MOTRIN) 800  MG tablet  Every 8 hours PRN     03/21/18 1721    HYDROcodone-acetaminophen (NORCO) 5-325 MG tablet  Every 6 hours PRN     03/21/18 1721       Duanne Guess, PA-C 03/21/18 1732    Duanne Guess, PA-C 03/21/18 1733    Harvest Dark, MD 03/21/18 2152

## 2018-06-05 ENCOUNTER — Encounter (HOSPITAL_COMMUNITY): Payer: Self-pay | Admitting: Emergency Medicine

## 2018-06-05 ENCOUNTER — Emergency Department (HOSPITAL_COMMUNITY): Payer: BLUE CROSS/BLUE SHIELD

## 2018-06-05 ENCOUNTER — Emergency Department (HOSPITAL_COMMUNITY)
Admission: EM | Admit: 2018-06-05 | Discharge: 2018-06-06 | Disposition: A | Discharge status: Home / Self Care | Payer: BLUE CROSS/BLUE SHIELD | Attending: Emergency Medicine | Admitting: Emergency Medicine

## 2018-06-05 DIAGNOSIS — Z79899 Other long term (current) drug therapy: Secondary | ICD-10-CM | POA: Insufficient documentation

## 2018-06-05 DIAGNOSIS — R609 Edema, unspecified: Secondary | ICD-10-CM | POA: Insufficient documentation

## 2018-06-05 DIAGNOSIS — R0602 Shortness of breath: Secondary | ICD-10-CM | POA: Insufficient documentation

## 2018-06-05 LAB — BASIC METABOLIC PANEL
Anion gap: 9 (ref 5–15)
BUN: 7 mg/dL (ref 6–20)
CALCIUM: 8.4 mg/dL — AB (ref 8.9–10.3)
CHLORIDE: 105 mmol/L (ref 98–111)
CO2: 26 mmol/L (ref 22–32)
CREATININE: 0.78 mg/dL (ref 0.44–1.00)
GFR calc non Af Amer: >60 mL/min (ref >60)
Glucose, Bld: 108 mg/dL — ABNORMAL HIGH (ref 70–99)
Potassium: 3.6 mmol/L (ref 3.5–5.1)
SODIUM: 140 mmol/L (ref 135–145)

## 2018-06-05 LAB — CBC
HCT: 28.6 % — ABNORMAL LOW (ref 36.0–46.0)
Hemoglobin: 8.9 g/dL — ABNORMAL LOW (ref 12.0–15.0)
MCH: 26.3 pg (ref 26.0–34.0)
MCHC: 31.1 g/dL (ref 30.0–36.0)
MCV: 84.6 fL (ref 78.0–100.0)
PLATELETS: 404 10*3/uL — AB (ref 150–400)
RBC: 3.38 MIL/uL — ABNORMAL LOW (ref 3.87–5.11)
RDW: 14.6 % (ref 11.5–15.5)
WBC: 11.3 10*3/uL — ABNORMAL HIGH (ref 4.0–10.5)

## 2018-06-05 LAB — I-STAT TROPONIN, ED: TROPONIN I, POC: 0 ng/mL (ref 0.00–0.08)

## 2018-06-05 LAB — TSH: TSH: 2.898 u[IU]/mL (ref 0.350–4.500)

## 2018-06-05 LAB — BRAIN NATRIURETIC PEPTIDE: B NATRIURETIC PEPTIDE 5: 113.9 pg/mL — AB (ref 0.0–100.0)

## 2018-06-05 LAB — D-DIMER, QUANTITATIVE: D-Dimer, Quant: 0.31 ug/mL-FEU (ref 0.00–0.50)

## 2018-06-05 MED ORDER — FUROSEMIDE 10 MG/ML IJ SOLN
40.0000 mg | Freq: Once | INTRAMUSCULAR | Status: AC
  Administered 2018-06-05: 40 mg via INTRAVENOUS
  Filled 2018-06-05: qty 4

## 2018-06-05 NOTE — ED Provider Notes (Signed)
Patient placed in Quick Look pathway, seen and evaluated   Chief Complaint: Leg swelling, shortness of breath  HPI:   Patient with 3-week history of progressively worsening shortness of breath, orthopnea, dyspnea on exertion.  Bilateral lower extremity edema beginning on Wednesday 3 days ago.  Worse on the left than the right.  No recent travel or surgeries, no hemoptysis, no prior history of DVT or PE, no hormonal placement therapy.  No fevers or cough.  No chest pain.  ROS: Leg swelling, shortness of breath  Physical Exam:   Gen: No distress  Neuro: Awake and Alert  Skin: Warm    Focused Exam: 2+ DP/PT pulses bilaterally.  Heart regular rate and rhythm, no murmurs rubs or gallops.  Lungs clear to auscultation bilaterally.  2+ pitting edema of the bilateral lower extremities with erythema noted.  Homans sign absent bilaterally.   Initiation of care has begun. The patient has been counseled on the process, plan, and necessity for staying for the completion/evaluation, and the remainder of the medical screening examination    Renita Papa, PA-C 06/05/18 [place], [place], MD 06/06/18 [phone]

## 2018-06-05 NOTE — ED Provider Notes (Signed)
[facility] EMERGENCY DEPARTMENT Provider Note   CSN: [id number] Arrival date & time: 06/05/18  1848     History   Chief Complaint Chief Complaint  Patient presents with  . Leg Swelling    HPI Bay Jarquin is a 37 y.o. female.  The history is provided by the patient. No language interpreter was used.     37 year old female presenting complaining of leg swelling.  Patient report for the past 3 days she has noticed progressive worsening swelling to both of her legs worsening especially with prolonged standing.  Furthermore she now complaining of some chest discomfort as well as shortness of breath especially with exertion or walking up the steps.  The symptoms are new to her.  No specific treatment tried.  No change in medication or change activity.  No fever, chills, headache, lightheadedness, dizziness, heart palpitation, neck pain, trouble swallowing, abdominal pain, focal numbness or weakness.  No prior history of cardiac disease, no prior history of PE or DVT, no recent surgery, prolonged bedrest, active cancer, or taking oral hormone.  History reviewed. No pertinent past medical history.  There are no active problems to display for this patient.   Past Surgical History:  Procedure Laterality Date  . CESAREAN SECTION    . CHOLECYSTECTOMY    . TUBAL LIGATION    . WISDOM TOOTH EXTRACTION       OB History    Gravida  2   Para  2   Term  2   Preterm      AB      Living  2     SAB      TAB      Ectopic      Multiple      Live Births               Home Medications    Prior to Admission medications   Medication Sig Start Date End Date Taking? Authorizing Provider  cephALEXin (KEFLEX) 500 MG capsule Take 1 capsule (500 mg total) by mouth 2 (two) times daily. Patient not taking: Reported on 08/28/2016 08/21/16   Nona Dell, PA-C  HYDROcodone-acetaminophen [facility]) 5-325 MG tablet Take 1 tablet by mouth every 6 (six)  hours as needed for moderate pain. 03/21/18   Duanne Guess, PA-C  ibuprofen (ADVIL,MOTRIN) 800 MG tablet Take 1 tablet (800 mg total) by mouth every 8 (eight) hours as needed. 03/21/18   Duanne Guess, PA-C  ondansetron (ZOFRAN) 4 MG tablet Take 1 tablet (4 mg total) by mouth every 6 (six) hours. 08/28/16   Emeline General, PA-C    Family History Family History  Problem Relation Age of Onset  . Hypertension Father     Social History Social History   Tobacco Use  . Smoking status: Never Smoker  . Smokeless tobacco: Never Used  Substance Use Topics  . Alcohol use: No  . Drug use: No     Allergies   Prednisone   Review of Systems Review of Systems  All other systems reviewed and are negative.    Physical Exam Updated Vital Signs BP 126/82 (BP Location: Right Arm)   Pulse 93   Temp 98.6 F (37 C) (Oral)   Resp 15   LMP 05/05/2018   SpO2 99%   Physical Exam  Constitutional: She appears well-developed and well-nourished. No distress.  Obese female nontoxic in appearance  HENT:  Head: Atraumatic.  Eyes: Conjunctivae are normal.  Neck: Neck supple.  No JVD present. No thyromegaly present.  Cardiovascular: Normal rate and regular rhythm.  Pulmonary/Chest: Effort normal and breath sounds normal.  Abdominal: Soft. Bowel sounds are normal. She exhibits no distension. There is no tenderness.  Musculoskeletal: She exhibits edema (2+ pitting edema to bilateral lower extremity with intact distal pedal pulses.).  Neurological: She is alert.  Skin: No rash noted.  Psychiatric: She has a normal mood and affect.  Nursing note and vitals reviewed.    ED Treatments / Results  Labs (all labs ordered are listed, but only abnormal results are displayed) Labs Reviewed  CBC - Abnormal; Notable for the following components:      Result Value   WBC 11.3 (*)    RBC 3.38 (*)    Hemoglobin 8.9 (*)    HCT 28.6 (*)    Platelets 404 (*)    All other components within  normal limits  BASIC METABOLIC PANEL - Abnormal; Notable for the following components:   Glucose, Bld 108 (*)    Calcium 8.4 (*)    All other components within normal limits  BRAIN NATRIURETIC PEPTIDE - Abnormal; Notable for the following components:   B Natriuretic Peptide 113.9 (*)    All other components within normal limits  HEPATIC FUNCTION PANEL - Abnormal; Notable for the following components:   Total Protein 6.1 (*)    Albumin 3.4 (*)    All other components within normal limits  TSH  D-DIMER, QUANTITATIVE (NOT AT [facility])  PREGNANCY, URINE  I-STAT TROPONIN, ED    EKG None  ED ECG REPORT   Date: 06/06/2018  Rate: 92  Rhythm: normal sinus rhythm  QRS Axis: normal  Intervals: normal  ST/T Wave abnormalities: nonspecific ST changes  Conduction Disutrbances:none  Narrative Interpretation:   Old EKG Reviewed: unchanged  I have personally reviewed the EKG tracing and agree with the computerized printout as noted.   Radiology Dg Chest 2 View  Result Date: 06/05/2018 CLINICAL DATA:  Shortness of breath, lower extremity edema EXAM: CHEST - 2 VIEW COMPARISON:  08/28/2016 FINDINGS: The heart size and mediastinal contours are within normal limits. Both lungs are clear. The visualized skeletal structures are unremarkable. IMPRESSION: No active cardiopulmonary disease. Electronically Signed   By: Jerilynn Mages.  Shick M.D.   On: 06/05/2018 20:20    Procedures Procedures (including critical care time)  Medications Ordered in ED Medications  furosemide (LASIX) injection 40 mg (40 mg Intravenous Given 06/05/18 2242)     Initial Impression / Assessment and Plan / ED Course  I have reviewed the triage vital signs and the nursing notes.  Pertinent labs & imaging results that were available during my care of the patient were reviewed by me and considered in my medical decision making (see chart for details).     BP 122/72   Pulse 74   Temp 98.6 F (37 C) (Oral)   Resp 15   LMP  05/05/2018   SpO2 100%    Final Clinical Impressions(s) / ED Diagnoses   Final diagnoses:  Peripheral edema    ED Discharge Orders    None     12:09 AM Patient presents complaining of bilateral leg swelling for the past 3 days.  She does have evidence of pitting edema to her lower extremities without evidence of infection no palpable cords to suggest DVT.  She also complains of some shortness of breath.  Lungs are clear on auscultation.  She does not have any significant risk factor for PE or DVT and  her d-dimer is negative.  Her liver function is normal, normal TSH, normal troponin mild elevated BNP of 113.  Electrolyte panels are reassuring, kidney function is normal.  Evidence of anemia with hemoglobin of 8.9.  At this time, her chest x-ray shows no active disease and no pleural effusion.  Patient receive Lasix 40 mg while in the ED.  Encourage patient to follow-up outpatient for further evaluation of her condition.  She may benefit from an echocardiogram if indicated. Pt voice understanding and agrees with plan.  Recommend keeping legs elevated at rest and to wear compressive stocking. Care discussed with Dr. Weldon Picking, Gertie Fey, PA-C 06/06/18 [place], [place], DO 06/08/18 1207

## 2018-06-05 NOTE — ED Triage Notes (Signed)
Pt presents to ED for assessment of bilateral lower limb swelling with redness noted to ankles since Wednesday, with some SOB with exertion for 2 weeks, increased urination, pitting edema noted in triage.  Pt states they feel like "when your leg falls asleep"

## 2018-06-06 LAB — HEPATIC FUNCTION PANEL
ALBUMIN: 3.4 g/dL — AB (ref 3.5–5.0)
ALK PHOS: 78 U/L (ref 38–126)
ALT: 12 U/L (ref 0–44)
AST: 15 U/L (ref 15–41)
BILIRUBIN TOTAL: 0.4 mg/dL (ref 0.3–1.2)
Bilirubin, Direct: <0.1 mg/dL (ref 0.0–0.2)
Total Protein: 6.1 g/dL — ABNORMAL LOW (ref 6.5–8.1)

## 2018-09-04 ENCOUNTER — Encounter (HOSPITAL_COMMUNITY): Payer: Self-pay

## 2018-09-04 ENCOUNTER — Ambulatory Visit (HOSPITAL_COMMUNITY)
Admission: EM | Admit: 2018-09-04 | Discharge: 2018-09-04 | Disposition: A | Discharge status: Home / Self Care | Payer: PRIVATE HEALTH INSURANCE | Attending: Family Medicine | Admitting: Family Medicine

## 2018-09-04 ENCOUNTER — Other Ambulatory Visit: Payer: Self-pay

## 2018-09-04 DIAGNOSIS — B9789 Other viral agents as the cause of diseases classified elsewhere: Secondary | ICD-10-CM

## 2018-09-04 DIAGNOSIS — J069 Acute upper respiratory infection, unspecified: Secondary | ICD-10-CM | POA: Diagnosis not present

## 2018-09-04 DIAGNOSIS — J22 Unspecified acute lower respiratory infection: Secondary | ICD-10-CM | POA: Diagnosis not present

## 2018-09-04 MED ORDER — ONDANSETRON HCL 4 MG PO TABS: 4.0000 mg | ORAL_TABLET | Freq: Four times a day (QID) | ORAL | 0 refills | Status: DC

## 2018-09-04 MED ORDER — AMOXICILLIN 875 MG PO TABS: 875.0000 mg | ORAL_TABLET | Freq: Two times a day (BID) | ORAL | 0 refills | Status: DC

## 2018-09-04 NOTE — ED Provider Notes (Signed)
[facility]    CSN: [id number] Arrival date & time: 09/04/18  0813     History   Chief Complaint Chief Complaint  Patient presents with  . Cough    HPI Robin Lopez is a 38 y.o. female.   HPI  Patient has been sick for over a week.  Today is day 8.  She started off with cold symptoms.  She thought things were getting better.  The last 2 days she has had some fever, increased coughing, chest pain with deep breath with coughing.  She has been taking TheraFlu.  No other over-the-counter medicines.  She has not taken her temperature.  She feels extreme fatigue.  She is having some body aches.  No known exposure to influenza.  She did not get a flu shot this year.  Yesterday developed some nausea and vomiting.  Is trying to keep up with her fluids.  Has had several episodes of diarrhea.  History reviewed. No pertinent past medical history.  There are no active problems to display for this patient.   Past Surgical History:  Procedure Laterality Date  . CESAREAN SECTION    . CHOLECYSTECTOMY    . TUBAL LIGATION    . WISDOM TOOTH EXTRACTION      OB History    Gravida  2   Para  2   Term  2   Preterm      AB      Living  2     SAB      TAB      Ectopic      Multiple      Live Births               Home Medications    Prior to Admission medications   Medication Sig Start Date End Date Taking? Authorizing Provider  amoxicillin (AMOXIL) 875 MG tablet Take 1 tablet (875 mg total) by mouth 2 (two) times daily. 09/04/18   Raylene Everts, MD  ondansetron (ZOFRAN) 4 MG tablet Take 1 tablet (4 mg total) by mouth every 6 (six) hours. 09/04/18   Raylene Everts, MD    Family History Family History  Problem Relation Age of Onset  . Hypertension Father     Social History Social History   Tobacco Use  . Smoking status: Never Smoker  . Smokeless tobacco: Never Used  Substance Use Topics  . Alcohol use: No  . Drug use: No     Allergies     Prednisone   Review of Systems Review of Systems  Constitutional: Positive for chills, fatigue and fever.  HENT: Positive for congestion, postnasal drip and rhinorrhea. Negative for ear pain and sore throat.   Eyes: Negative for pain and visual disturbance.  Respiratory: Positive for cough. Negative for shortness of breath.   Cardiovascular: Positive for chest pain. Negative for palpitations.  Gastrointestinal: Negative for abdominal pain, nausea and vomiting.  Genitourinary: Negative for dysuria and hematuria.  Musculoskeletal: Negative for arthralgias and back pain.  Skin: Negative for color change and rash.  Neurological: Negative for seizures and syncope.  All other systems reviewed and are negative.    Physical Exam Triage Vital Signs ED Triage Vitals  Enc Vitals Group     BP 09/04/18 0907 (!) 148/93     Pulse Rate 09/04/18 0907 86     Resp 09/04/18 0907 18     Temp 09/04/18 0907 98.7 F (37.1 C)     Temp Source 09/04/18 [id number]  Oral     SpO2 09/04/18 0907 100 %     Weight 09/04/18 0905 220 lb (99.8 kg)     Height --      Head Circumference --      Peak Flow --      Pain Score 09/04/18 0905 6     Pain Loc --      Pain Edu? --      Excl. in [place]? --    No data found.  Updated Vital Signs BP (!) 148/93 (BP Location: Left Arm)   Pulse 86   Temp 98.7 F (37.1 C) (Oral)   Resp 18   Wt 99.8 kg   LMP 08/31/2018   SpO2 100%   BMI 35.51 kg/m      Physical Exam Constitutional:      General: She is not in acute distress.    Appearance: She is well-developed. She is ill-appearing.     Comments: Appears tired, moderately ill  HENT:     Head: Normocephalic and atraumatic.     Right Ear: Tympanic membrane, ear canal and external ear normal.     Left Ear: Tympanic membrane, ear canal and external ear normal.     Nose: Rhinorrhea present. No congestion.     Mouth/Throat:     Mouth: Mucous membranes are moist.     Pharynx: No posterior oropharyngeal erythema.      Comments: Tonsils removed Eyes:     Conjunctiva/sclera: Conjunctivae normal.     Pupils: Pupils are equal, round, and reactive to light.  Neck:     Musculoskeletal: Normal range of motion.  Cardiovascular:     Rate and Rhythm: Normal rate.     Heart sounds: Normal heart sounds.  Pulmonary:     Effort: Pulmonary effort is normal. No respiratory distress.     Breath sounds: Rhonchi present.     Comments: Few anterior rhonchi Abdominal:     General: Abdomen is flat. There is no distension.     Palpations: Abdomen is soft.  Musculoskeletal: Normal range of motion.  Skin:    General: Skin is warm and dry.  Neurological:     Mental Status: She is alert.  Psychiatric:        Mood and Affect: Mood normal.        Thought Content: Thought content normal.      UC Treatments / Results  Labs (all labs ordered are listed, but only abnormal results are displayed) Labs Reviewed - No data to display  EKG None  Radiology No results found.  Procedures Procedures (including critical care time)  Medications Ordered in UC Medications - No data to display  Initial Impression / Assessment and Plan / UC Course  I have reviewed the triage vital signs and the nursing notes.  Pertinent labs & imaging results that were available during my care of the patient were reviewed by me and considered in my medical decision making (see chart for details).     Patient has worsening symptoms after 7 days in spite of appropriate conservative treatment.  Concern for possible development of influenza on top of her cold or possible worsening performed with bacterial infection.  Difficult decision.  Will treat with antibiotics.  Discussed symptomatic care.  Return if not better in a few days Final Clinical Impressions(s) / UC Diagnoses   Final diagnoses:  Viral URI with cough  LRTI (lower respiratory tract infection)     Discharge Instructions     Rest Drink more  fluids Take zofran for  nausea Take the antibiotic as directed Consider Delsym for cough Consider imodium if needed diarrhea Expect improvement in a few days   ED Prescriptions    Medication Sig Dispense Auth. Provider   ondansetron (ZOFRAN) 4 MG tablet Take 1 tablet (4 mg total) by mouth every 6 (six) hours. 12 tablet Raylene Everts, MD   amoxicillin (AMOXIL) 875 MG tablet Take 1 tablet (875 mg total) by mouth 2 (two) times daily. 14 tablet Raylene Everts, MD     Controlled Substance Prescriptions [place] Controlled Substance Registry consulted? Not Applicable   Raylene Everts, MD 09/04/18 1056

## 2018-09-04 NOTE — ED Triage Notes (Signed)
Pt cc SOB , mouth is dry and coughing. This started last night. Pt states she has been taking Thera Flu.

## 2018-09-04 NOTE — Discharge Instructions (Addendum)
Rest Drink more fluids Take zofran for nausea Take the antibiotic as directed Consider Delsym for cough Consider imodium if needed diarrhea Expect improvement in a few days

## 2018-09-17 ENCOUNTER — Encounter (HOSPITAL_COMMUNITY): Payer: Self-pay | Admitting: Emergency Medicine

## 2018-09-17 ENCOUNTER — Ambulatory Visit (INDEPENDENT_AMBULATORY_CARE_PROVIDER_SITE_OTHER): Payer: PRIVATE HEALTH INSURANCE

## 2018-09-17 ENCOUNTER — Ambulatory Visit (HOSPITAL_COMMUNITY)
Admission: EM | Admit: 2018-09-17 | Discharge: 2018-09-17 | Disposition: A | Discharge status: Home / Self Care | Payer: PRIVATE HEALTH INSURANCE | Attending: Family Medicine | Admitting: Family Medicine

## 2018-09-17 DIAGNOSIS — J209 Acute bronchitis, unspecified: Secondary | ICD-10-CM | POA: Diagnosis not present

## 2018-09-17 MED ORDER — AZITHROMYCIN 250 MG PO TABS: 250.0000 mg | ORAL_TABLET | Freq: Every day | ORAL | 0 refills | Status: DC

## 2018-09-17 MED ORDER — HYDROCODONE-HOMATROPINE 5-1.5 MG/5ML PO SYRP: 5.0000 mL | ORAL_SOLUTION | Freq: Four times a day (QID) | ORAL | 0 refills | Status: DC | PRN

## 2018-09-17 NOTE — ED Provider Notes (Signed)
[place]    CSN: [id number] Arrival date & time: 09/17/18  1638     History   Chief Complaint Chief Complaint  Patient presents with  . Flu-Like Symptoms    HPI Robin Lopez is a 38 y.o. female.   Patient has had ongoing respiratory symptoms since end of December.  She was treated with amoxicillin.  Initially felt better but last night began productive cough with headache fever and chills.  Denies GI symptoms  HPI  History reviewed. No pertinent past medical history.  There are no active problems to display for this patient.   Past Surgical History:  Procedure Laterality Date  . CESAREAN SECTION    . CHOLECYSTECTOMY    . TUBAL LIGATION    . WISDOM TOOTH EXTRACTION      OB History    Gravida  2   Para  2   Term  2   Preterm      AB      Living  2     SAB      TAB      Ectopic      Multiple      Live Births               Home Medications    Prior to Admission medications   Medication Sig Start Date End Date Taking? Authorizing Provider  amoxicillin (AMOXIL) 875 MG tablet Take 1 tablet (875 mg total) by mouth 2 (two) times daily. 09/04/18   Raylene Everts, MD  ondansetron (ZOFRAN) 4 MG tablet Take 1 tablet (4 mg total) by mouth every 6 (six) hours. 09/04/18   Raylene Everts, MD    Family History Family History  Problem Relation Age of Onset  . Hypertension Father     Social History Social History   Tobacco Use  . Smoking status: Never Smoker  . Smokeless tobacco: Never Used  Substance Use Topics  . Alcohol use: No  . Drug use: No     Allergies   Prednisone   Review of Systems Review of Systems  Constitutional: Positive for fever.  Respiratory: Positive for cough and wheezing.   Neurological: Positive for headaches.  All other systems reviewed and are negative.    Physical Exam Triage Vital Signs ED Triage Vitals  Enc Vitals Group     BP 09/17/18 1724 (!) 132/96     Pulse Rate 09/17/18 1724  (!) 102     Resp 09/17/18 1724 20     Temp 09/17/18 1724 99.5 F (37.5 C)     Temp Source 09/17/18 1724 Oral     SpO2 09/17/18 1724 99 %     Weight --      Height --      Head Circumference --      Peak Flow --      Pain Score 09/17/18 1725 9     Pain Loc --      Pain Edu? --      Excl. in [place]? --    No data found.  Updated Vital Signs BP (!) 132/96 (BP Location: Left Arm)   Pulse (!) 102   Temp 99.5 F (37.5 C) (Oral)   Resp 20   LMP 09/17/2018   SpO2 99%   Visual Acuity Right Eye Distance:   Left Eye Distance:   Bilateral Distance:    Right Eye Near:   Left Eye Near:    Bilateral Near:     Physical Exam  Constitutional:      Appearance: Normal appearance. She is obese.  HENT:     Right Ear: Tympanic membrane normal.     Left Ear: Tympanic membrane normal.     Nose: Nose normal.     Mouth/Throat:     Mouth: Mucous membranes are moist.     Pharynx: Oropharynx is clear.  Cardiovascular:     Rate and Rhythm: Normal rate and regular rhythm.  Pulmonary:     Effort: Pulmonary effort is normal.     Breath sounds: Wheezing present.  Neurological:     Mental Status: She is alert.      UC Treatments / Results  Labs (all labs ordered are listed, but only abnormal results are displayed) Labs Reviewed - No data to display  EKG None  Radiology No results found.  Procedures Procedures (including critical care time)  Medications Ordered in UC Medications - No data to display  Initial Impression / Assessment and Plan / UC Course  I have reviewed the triage vital signs and the nursing notes.  Pertinent labs & imaging results that were available during my care of the patient were reviewed by me and considered in my medical decision making (see chart for details).     Bronchitis.  Will treat with Z-Pak Mucinex Final Clinical Impressions(s) / UC Diagnoses   Final diagnoses:  None   Discharge Instructions   None    ED Prescriptions    None      Controlled Substance Prescriptions [place] Controlled Substance Registry consulted? Yes, I have consulted the [place] Controlled Substances Registry for this patient, and feel the risk/benefit ratio today is favorable for proceeding with this prescription for a controlled substance.   Wardell Honour, MD 09/17/18 [phone]

## 2018-09-17 NOTE — ED Triage Notes (Signed)
Pt presents to [facility] for assessment of URI symptoms continuing since the end of December.  Pt treated for URI with amoxicillin, which is completed.  Pt states she felt better for 3 days and began coughing last night.  C/o headache, brown and green sputum, no congestion, denies n/v/d.

## 2018-11-15 ENCOUNTER — Other Ambulatory Visit: Payer: Self-pay

## 2018-11-15 ENCOUNTER — Encounter (HOSPITAL_COMMUNITY): Payer: Self-pay | Admitting: Emergency Medicine

## 2018-11-15 ENCOUNTER — Ambulatory Visit (HOSPITAL_COMMUNITY)
Admission: EM | Admit: 2018-11-15 | Discharge: 2018-11-15 | Disposition: A | Discharge status: Home / Self Care | Payer: PRIVATE HEALTH INSURANCE | Attending: Family Medicine | Admitting: Family Medicine

## 2018-11-15 DIAGNOSIS — S7011XA Contusion of right thigh, initial encounter: Secondary | ICD-10-CM | POA: Diagnosis not present

## 2018-11-15 NOTE — ED Provider Notes (Signed)
[place]    CSN: [id number] Arrival date & time: 11/15/18  1151     History   Chief Complaint Chief Complaint  Patient presents with  . Leg Pain    HPI Robin Lopez is a 38 y.o. female.   38 year old female comes in for bruising to the right posterior thigh. States woke up this morning, had some soreness and noticed the bruising when she checked. She does no recall any injury/trauma. Area is tender to palpation. Denies swelling of the thigh/leg. Denies long travels, birth control use, personal history of blood clots. Never smoker. States brother has a "clotting issue" with history of rheumatoid. Denies chest pain, shortness of breath. Denies chronic NSAID use, blood thinner use. Denies fever, chills, night sweats.     History reviewed. No pertinent past medical history.  There are no active problems to display for this patient.   Past Surgical History:  Procedure Laterality Date  . CESAREAN SECTION    . CHOLECYSTECTOMY    . TUBAL LIGATION    . WISDOM TOOTH EXTRACTION      OB History    Gravida  2   Para  2   Term  2   Preterm      AB      Living  2     SAB      TAB      Ectopic      Multiple      Live Births               Home Medications    Prior to Admission medications   Not on File    Family History Family History  Problem Relation Age of Onset  . Hypertension Father     Social History Social History   Tobacco Use  . Smoking status: Never Smoker  . Smokeless tobacco: Never Used  Substance Use Topics  . Alcohol use: No  . Drug use: No     Allergies   Prednisone   Review of Systems Review of Systems  Reason unable to perform ROS: See HPI as above.     Physical Exam Triage Vital Signs ED Triage Vitals [11/15/18 1224]  Enc Vitals Group     BP (!) 119/96     Pulse Rate 80     Resp 18     Temp 98.6 F (37 C)     Temp Source Oral     SpO2 100 %     Weight      Height      Head Circumference       Peak Flow      Pain Score 6     Pain Loc      Pain Edu?      Excl. in [place]?    No data found.  Updated Vital Signs BP (!) 119/96 (BP Location: Right Arm)   Pulse 80   Temp 98.6 F (37 C) (Oral)   Resp 18   SpO2 100%   Physical Exam Constitutional:      General: She is not in acute distress.    Appearance: She is well-developed. She is not ill-appearing, toxic-appearing or diaphoretic.  HENT:     Head: Normocephalic and atraumatic.  Eyes:     Conjunctiva/sclera: Conjunctivae normal.     Pupils: Pupils are equal, round, and reactive to light.  Musculoskeletal:     Comments: See picture below. Contusion to the right posterior thigh, about 1cm x 2cm.  Area tender to palpation without fluctuance felt. Patient with varicose veins that are soft to palpation, no tenderness. Full ROM of hip/knee. Negative homans.   Skin:    General: Skin is warm and dry.  Neurological:     Mental Status: She is alert and oriented to person, place, and time.        UC Treatments / Results  Labs (all labs ordered are listed, but only abnormal results are displayed) Labs Reviewed - No data to display  EKG None  Radiology No results found.  Procedures Procedures (including critical care time)  Medications Ordered in UC Medications - No data to display  Initial Impression / Assessment and Plan / UC Course  I have reviewed the triage vital signs and the nursing notes.  Pertinent labs & imaging results that were available during my care of the patient were reviewed by me and considered in my medical decision making (see chart for details).    No alarming signs on exam. Warm compress to the area. Return precautions given. Patient expresses understanding and agrees to plan.  Final Clinical Impressions(s) / UC Diagnoses   Final diagnoses:  Contusion of right thigh, initial encounter   ED Prescriptions    None        Ok Edwards, PA-C 11/15/18 1553

## 2018-11-15 NOTE — ED Triage Notes (Signed)
Pt presents to [facility] for assessment of unexplained bruising to the back of her right leg.  Also c/o bilateral posterior thigh pain.

## 2018-11-15 NOTE — Discharge Instructions (Signed)
No alarming signs on exam. Warm compress to the area. You can take ibuprofen/tylenol to help with the pain. Follow up with PCP for further evaluation if noticing more bruising. Monitor for bleeding in the gums, significant blood in stool, weakness, dizziness, go to the emergency department for further evaluation needed.

## 2018-11-25 ENCOUNTER — Emergency Department: Payer: PRIVATE HEALTH INSURANCE

## 2018-11-25 ENCOUNTER — Emergency Department
Admission: EM | Admit: 2018-11-25 | Discharge: 2018-11-25 | Disposition: A | Discharge status: Home / Self Care | Payer: PRIVATE HEALTH INSURANCE | Attending: Emergency Medicine | Admitting: Emergency Medicine

## 2018-11-25 ENCOUNTER — Encounter: Payer: Self-pay | Admitting: Emergency Medicine

## 2018-11-25 ENCOUNTER — Other Ambulatory Visit: Payer: Self-pay

## 2018-11-25 DIAGNOSIS — N939 Abnormal uterine and vaginal bleeding, unspecified: Secondary | ICD-10-CM | POA: Insufficient documentation

## 2018-11-25 DIAGNOSIS — D259 Leiomyoma of uterus, unspecified: Secondary | ICD-10-CM | POA: Diagnosis not present

## 2018-11-25 LAB — POCT PREGNANCY, URINE: Preg Test, Ur: NEGATIVE

## 2018-11-25 NOTE — ED Notes (Signed)
Patient transported to Ultrasound 

## 2018-11-25 NOTE — ED Provider Notes (Signed)
[facility] Emergency Department Provider Note  ____________________________________________  Time seen: Approximately 2:41 PM  I have reviewed the triage vital signs and the nursing notes.   HISTORY  Chief Complaint Vaginal Bleeding    HPI Robin Lopez is a 38 y.o. female with no significant past medical history who comes to the ED complaining of vaginal bleeding for the past almost 3 weeks.  She reports having sexual intercourse on March 6 third for the first time in over a year.  She had some bleeding during intercourse, and had persistent vaginal spotting afterward.  The next day she developed pelvic pain as well, gradual onset, continuous, waxing and waning, no aggravating or alleviating factors, nonradiating.  She has had persistent spotting over the last 3 weeks as well, with increased bleeding in the last 24 hours.  No chest pain shortness of breath fevers or chills dysuria frequency urgency vaginal discharge lightheadedness or syncope.  Tried to go to her primary care doctor who told her to go to urgent care instead.  Urgent care did a pregnancy test which was negative and then told her to come to the emergency room.      History reviewed. No pertinent past medical history.   There are no active problems to display for this patient.    Past Surgical History:  Procedure Laterality Date  . CESAREAN SECTION    . CHOLECYSTECTOMY    . TUBAL LIGATION    . WISDOM TOOTH EXTRACTION       Prior to Admission medications   Not on File  None No blood thinners   Allergies Prednisone   Family History  Problem Relation Age of Onset  . Hypertension Father     Social History Social History   Tobacco Use  . Smoking status: Never Smoker  . Smokeless tobacco: Never Used  Substance Use Topics  . Alcohol use: No  . Drug use: No    Review of Systems  Constitutional:   No fever or chills.  ENT:   No sore throat. No  rhinorrhea. Cardiovascular:   No chest pain or syncope. Respiratory:   No dyspnea or cough. Gastrointestinal:   Negative for abdominal pain, vomiting and diarrhea.  Musculoskeletal:   Negative for focal pain or swelling All other systems reviewed and are negative except as documented above in ROS and HPI.  ____________________________________________   PHYSICAL EXAM:  VITAL SIGNS: ED Triage Vitals [11/25/18 1314]  Enc Vitals Group     BP (!) 162/76     Pulse Rate 67     Resp 18     Temp 97.9 F (36.6 C)     Temp Source Oral     SpO2 100 %     Weight 230 lb (104.3 kg)     Height 5\' 6"  (1.676 m)     Head Circumference      Peak Flow      Pain Score 6     Pain Loc      Pain Edu?      Excl. in [place]?     Vital signs reviewed, nursing assessments reviewed.   Constitutional:   Alert and oriented. Non-toxic appearance. Eyes:   Conjunctivae are normal. EOMI.  ENT      Head:   Normocephalic and atraumatic.           Mouth/Throat:   MMM       Neck:   No meningismus. Full ROM. Cardiovascular:   RRR. Marland Kitchen Respiratory:  Normal respiratory effort without tachypnea/retractions. Gastrointestinal:   Soft and nontender. Non distended. There is no CVA tenderness.  No rebound, rigidity, or guarding. Genitourinary:   Patient declines pelvic/speculum exam Musculoskeletal:   Normal range of motion in all extremities. No joint effusions.  No lower extremity tenderness.  No edema. Neurologic:   Normal speech and language.  Motor grossly intact. No acute focal neurologic deficits are appreciated.  Skin:    Skin is warm, dry and intact. No rash noted.  No petechiae, purpura, or bullae.  ____________________________________________    LABS (pertinent positives/negatives) (all labs ordered are listed, but only abnormal results are displayed) Labs Reviewed  POCT PREGNANCY, URINE   ____________________________________________   EKG    ____________________________________________     RADIOLOGY  No results found.  ____________________________________________   PROCEDURES Procedures  ____________________________________________  DIFFERENTIAL DIAGNOSIS   Vaginal mucosal tear, vaginal contusion, abnormal menses, ovarian cyst, uterine fibroid.  Doubt STI PID TOA torsion.  CLINICAL IMPRESSION / ASSESSMENT AND PLAN / ED COURSE  Medications ordered in the ED: Medications - No data to display  Pertinent labs & imaging results that were available during my care of the patient were reviewed by me and considered in my medical decision making (see chart for details).    Patient presents with vaginal spotting for the past 3 weeks associated with pelvic pain after intercourse.  Offered pelvic exam which patient refuses.  She would prefer to follow-up with her own doctor.  She is agreeable to obtaining an ultrasound today to evaluate for uterine and ovarian pathology.  If there are no serious findings I think she is stable for discharge home and outpatient follow-up, NSAIDs for pain control.  No reason to suspect acute blood loss anemia.  Vital signs are unremarkable.  Care will be signed out to oncoming physician to follow-up on ultrasound result for disposition.      ____________________________________________   FINAL CLINICAL IMPRESSION(S) / ED DIAGNOSES    Final diagnoses:  Abnormal vaginal bleeding     ED Discharge Orders    None      Portions of this note were generated with dragon dictation software. Dictation errors may occur despite best attempts at proofreading.   Carrie Mew, MD 11/25/18 [phone]

## 2018-11-25 NOTE — ED Triage Notes (Signed)
Patient reports having intercourse on 3/6 for the first time in over a year. Reports intermittent spotting since then. States today she has had increased bleeding. Also reports pain in left lower abdomen. Patient reports having tubal ligation 15 years ago. Had negative urine preg done at urgent care. Patient reports normally having extremely regular periods. Last normal period 2/14.

## 2019-06-09 ENCOUNTER — Inpatient Hospital Stay (HOSPITAL_COMMUNITY)
Admission: AD | Admit: 2019-06-09 | Discharge: 2019-06-09 | Disposition: A | Discharge status: Home / Self Care | Payer: PRIVATE HEALTH INSURANCE | Attending: Obstetrics and Gynecology | Admitting: Obstetrics and Gynecology

## 2019-06-09 ENCOUNTER — Encounter (HOSPITAL_COMMUNITY): Payer: Self-pay

## 2019-06-09 ENCOUNTER — Other Ambulatory Visit: Payer: Self-pay

## 2019-06-09 DIAGNOSIS — N939 Abnormal uterine and vaginal bleeding, unspecified: Secondary | ICD-10-CM | POA: Insufficient documentation

## 2019-06-09 DIAGNOSIS — Z3202 Encounter for pregnancy test, result negative: Secondary | ICD-10-CM

## 2019-06-09 LAB — URINALYSIS, ROUTINE W REFLEX MICROSCOPIC
Bilirubin Urine: NEGATIVE
Glucose, UA: NEGATIVE mg/dL
Ketones, ur: NEGATIVE mg/dL
Nitrite: NEGATIVE
Protein, ur: NEGATIVE mg/dL
RBC / HPF: >50 RBC/hpf — ABNORMAL HIGH (ref 0–5)
Specific Gravity, Urine: 1.012 (ref 1.005–1.030)
pH: 5 (ref 5.0–8.0)

## 2019-06-09 LAB — POCT PREGNANCY, URINE: Preg Test, Ur: NEGATIVE

## 2019-06-09 LAB — HCG, QUANTITATIVE, PREGNANCY: hCG, Beta Chain, Quant, S: <1 m[IU]/mL (ref <5)

## 2019-06-09 NOTE — MAU Provider Note (Signed)
S [name] is a 38 y.o. [id number] female who presents to MAU today with complaint of heavy VB and clots. Reports +HPT x2 last week.   O BP 139/75 (BP Location: Right Arm)   Pulse 75   Temp 97.6 F (36.4 C) (Oral)   Resp 20   Ht 5\' 6"  (1.676 m)   Wt 105.9 kg   LMP 04/19/2019   SpO2 100% Comment: room air  BMI 37.67 kg/m  Physical Exam  Constitutional: She is oriented to person, place, and time. She appears well-developed and well-nourished. No distress.  HENT:  Head: Normocephalic and atraumatic.  Neck: Normal range of motion.  Respiratory: Effort normal.  Musculoskeletal: Normal range of motion.  Neurological: She is alert and oriented to person, place, and time.  Psychiatric: She has a normal mood and affect.    A Non pregnant female Medical screening exam complete  Results for orders placed or performed during the hospital encounter of 06/09/19 (from the past 24 hour(s))  Urinalysis, Routine w reflex microscopic     Status: Abnormal   Collection Time: 06/09/19  9:25 AM  Result Value Ref Range   Color, Urine YELLOW YELLOW   APPearance HAZY (A) CLEAR   Specific Gravity, Urine 1.012 1.005 - 1.030   pH 5.0 5.0 - 8.0   Glucose, UA NEGATIVE NEGATIVE mg/dL   Hgb urine dipstick LARGE (A) NEGATIVE   Bilirubin Urine NEGATIVE NEGATIVE   Ketones, ur NEGATIVE NEGATIVE mg/dL   Protein, ur NEGATIVE NEGATIVE mg/dL   Nitrite NEGATIVE NEGATIVE   Leukocytes,Ua TRACE (A) NEGATIVE   RBC / HPF >50 (H) 0 - 5 RBC/hpf   WBC, UA 0-5 0 - 5 WBC/hpf   Bacteria, UA RARE (A) NONE SEEN   Squamous Epithelial / LPF 0-5 0 - 5   Mucus PRESENT   Pregnancy, urine POC     Status: None   Collection Time: 06/09/19  9:27 AM  Result Value Ref Range   Preg Test, Ur NEGATIVE NEGATIVE  hCG, quantitative, pregnancy     Status: None   Collection Time: 06/09/19 10:42 AM  Result Value Ref Range   hCG, Beta Chain, Quant, S <1 <5 mIU/mL   P Discharge from MAU in stable condition Patient given the  option of transfer to [facility] for further evaluation or seek care in outpatient facility of choice- pt plans to f/u at [facility] or [facility] List of options for follow-up given  Warning signs for worsening condition that would warrant emergency follow-up discussed Patient may return to MAU as needed for pregnancy related complaints  Julianne Handler, CNM 06/09/2019 11:59 AM

## 2019-06-09 NOTE — MAU Note (Signed)
Pt reporting bleeding which started Monday and was light until this morning. Patient is having active bleeding and seeing clots. Feels pain in hips and pelvis. Denies any vaginal discharge or problems with urination.

## 2020-02-07 ENCOUNTER — Encounter (HOSPITAL_COMMUNITY): Payer: Self-pay | Admitting: Emergency Medicine

## 2020-02-07 ENCOUNTER — Other Ambulatory Visit: Payer: Self-pay

## 2020-02-07 ENCOUNTER — Ambulatory Visit (HOSPITAL_COMMUNITY)
Admission: EM | Admit: 2020-02-07 | Discharge: 2020-02-07 | Disposition: A | Discharge status: Home / Self Care | Payer: 59 | Attending: Family Medicine | Admitting: Family Medicine

## 2020-02-07 DIAGNOSIS — J069 Acute upper respiratory infection, unspecified: Secondary | ICD-10-CM | POA: Insufficient documentation

## 2020-02-07 DIAGNOSIS — J040 Acute laryngitis: Secondary | ICD-10-CM | POA: Insufficient documentation

## 2020-02-07 DIAGNOSIS — Z20822 Contact with and (suspected) exposure to covid-19: Secondary | ICD-10-CM | POA: Insufficient documentation

## 2020-02-07 MED ORDER — AZITHROMYCIN 250 MG PO TABS: ORAL_TABLET | ORAL | 0 refills | Status: DC

## 2020-02-07 NOTE — ED Triage Notes (Signed)
Pt here for productive cough x 4 days with congestion and hoarse voice; pt denies fever

## 2020-02-07 NOTE — Discharge Instructions (Addendum)
Your COVID test is pending.  You should self quarantine until the test result is back.    Take Tylenol as needed for fever or discomfort.  Rest and keep yourself hydrated.    Go to the emergency department if you develop shortness of breath, severe diarrhea, high fever not relieved by Tylenol or ibuprofen, or other concerning symptoms.    

## 2020-02-08 LAB — SARS CORONAVIRUS 2 (TAT 6-24 HRS): SARS Coronavirus 2: NEGATIVE

## 2020-02-08 NOTE — ED Provider Notes (Signed)
[place]   [id number] 02/07/20 Arrival Time: 9735   CC: COVID symptoms  SUBJECTIVE: History from: patient.  Robin Lopez is a 39 y.o. female who presents with abrupt onset of nasal congestion, PND, laryngitis, and persistent dry cough for 4 days.  Denies sick exposure to COVID, flu or strep. Reports allergy symptoms for a week before losing her voice. Denies recent travel. Has tried hot tea and honey with temporary relief. There are no aggravating symptoms.  Denies previous symptoms in the past. Denies fever, chills, fatigue, sinus pain, sore throat, SOB, wheezing, chest pain, nausea, changes in bowel or bladder habits.    ROS: As per HPI.  All other pertinent ROS negative.     History reviewed. No pertinent past medical history. Past Surgical History:  Procedure Laterality Date  . CESAREAN SECTION    . CHOLECYSTECTOMY    . TUBAL LIGATION    . WISDOM TOOTH EXTRACTION     Allergies  Allergen Reactions  . Prednisone Itching and Rash   No current facility-administered medications on file prior to encounter.   No current outpatient medications on file prior to encounter.   Social History   Socioeconomic History  . Marital status: Married    Spouse name: Not on file  . Number of children: Not on file  . Years of education: Not on file  . Highest education level: Not on file  Occupational History  . Not on file  Tobacco Use  . Smoking status: Never Smoker  . Smokeless tobacco: Never Used  Substance and Sexual Activity  . Alcohol use: No  . Drug use: No  . Sexual activity: Yes    Birth control/protection: Surgical  Other Topics Concern  . Not on file  Social History Narrative  . Not on file   Social Determinants of Health   Financial Resource Strain:   . Difficulty of Paying Living Expenses:   Food Insecurity:   . Worried About Charity fundraiser in the Last Year:   . Arboriculturist in the Last Year:   Transportation Needs:   . Lexicographer (Medical):   Marland Kitchen Lack of Transportation (Non-Medical):   Physical Activity:   . Days of Exercise per Week:   . Minutes of Exercise per Session:   Stress:   . Feeling of Stress :   Social Connections:   . Frequency of Communication with Friends and Family:   . Frequency of Social Gatherings with Friends and Family:   . Attends Religious Services:   . Active Member of Clubs or Organizations:   . Attends Archivist Meetings:   Marland Kitchen Marital Status:   Intimate Partner Violence:   . Fear of Current or Ex-Partner:   . Emotionally Abused:   Marland Kitchen Physically Abused:   . Sexually Abused:    Family History  Problem Relation Age of Onset  . Hypertension Father     OBJECTIVE:  Vitals:   02/07/20 1842  BP: (!) 160/83  Pulse: 92  Resp: 18  Temp: 98.5 F (36.9 C)  TempSrc: Oral  SpO2: 100%     General appearance: alert; appears fatigued, but nontoxic; speaking in full sentences and tolerating own secretions HEENT: NCAT; Ears: EACs clear, TMs pearly gray; Eyes: PERRL.  EOM grossly intact. Sinuses: nontender; Nose: nares patent without rhinorrhea, Throat: oropharynx clear, tonsils non erythematous or enlarged, uvula midline  Neck: supple without LAD Lungs: unlabored respirations, symmetrical air entry; cough: moderate; no respiratory distress; CTAB  Heart: regular rate and rhythm.  Radial pulses 2+ symmetrical bilaterally Skin: warm and dry Psychological: alert and cooperative; normal mood and affect  LABS:  Results for orders placed or performed during the hospital encounter of 02/07/20 (from the past 24 hour(s))  SARS CORONAVIRUS 2 (TAT 6-24 HRS) Nasopharyngeal Nasopharyngeal Swab     Status: None   Collection Time: 02/07/20  6:47 PM   Specimen: Nasopharyngeal Swab  Result Value Ref Range   SARS Coronavirus 2 NEGATIVE NEGATIVE     ASSESSMENT & PLAN:  1. Upper respiratory tract infection, unspecified type   2. Laryngitis, acute     Meds ordered this  encounter  Medications  . azithromycin (ZITHROMAX Z-PAK) 250 MG tablet    Sig: Take 2 tablets today, and 1 tablet for the next 4 days.    Dispense:  6 tablet    Refill:  0    Order Specific Question:   Supervising Provider    Answer:   Chase Picket [id number]   URI Laryngitis Prescribed z-pack  COVID testing ordered.  It will take between 1-2 days for test results.  Someone will contact you regarding abnormal results.    Patient should remain in quarantine until they have received Covid results.  If negative you may resume normal activities (go back to work/school) while practicing hand hygiene, social distance, and mask wearing.  If positive, patient should remain in quarantine for 10 days from symptom onset AND greater than 72 hours after symptoms resolution (absence of fever without the use of fever-reducing medication and improvement in respiratory symptoms), whichever is longer Get plenty of rest and push fluids Use OTC zyrtec for nasal congestion, runny nose, and/or sore throat Use OTC flonase for nasal congestion and runny nose Use medications daily for symptom relief Use OTC medications like ibuprofen or tylenol as needed fever or pain Call or go to the ED if you have any new or worsening symptoms such as fever, worsening cough, shortness of breath, chest tightness, chest pain, turning blue, changes in mental status.  Reviewed expectations re: course of current medical issues. Questions answered. Outlined signs and symptoms indicating need for more acute intervention. Patient verbalized understanding. After Visit Summary given.         Faustino Congress, NP 02/08/20 1420

## 2020-02-11 ENCOUNTER — Other Ambulatory Visit: Payer: Self-pay

## 2020-02-11 ENCOUNTER — Ambulatory Visit (HOSPITAL_COMMUNITY)
Admission: EM | Admit: 2020-02-11 | Discharge: 2020-02-11 | Disposition: A | Discharge status: Home / Self Care | Payer: 59 | Attending: Family Medicine | Admitting: Family Medicine

## 2020-02-11 ENCOUNTER — Encounter (HOSPITAL_COMMUNITY): Payer: Self-pay

## 2020-02-11 DIAGNOSIS — J040 Acute laryngitis: Secondary | ICD-10-CM

## 2020-02-11 NOTE — Discharge Instructions (Signed)
Rest voice Do not whisper Drink lots of fluids Use a humidifier See ENT if symptoms persist

## 2020-02-11 NOTE — ED Triage Notes (Signed)
Pt presents for extended work note after being seen earlier in week for URI and laryngitis; pt still doe not have a voice and can not return to work.

## 2020-02-11 NOTE — ED Provider Notes (Signed)
[place]    CSN: [id number] Arrival date & time: 02/11/20  1600      History   Chief Complaint Chief Complaint  Patient presents with  . Work Note    HPI Robin Lopez is a 39 y.o. female.   HPI  She was seen on 02/07/2020 She was diagnosed with laryngitis She was given a Z-Pak She was told to come back if not better in a few days She is here because she still unable to speak She is unable to return to work Needs an extended work note No new symptoms  History reviewed. No pertinent past medical history.  There are no problems to display for this patient.   Past Surgical History:  Procedure Laterality Date  . CESAREAN SECTION    . CHOLECYSTECTOMY    . TUBAL LIGATION    . WISDOM TOOTH EXTRACTION      OB History    Gravida  3   Para  2   Term  2   Preterm      AB      Living  2     SAB      TAB      Ectopic      Multiple      Live Births               Home Medications    Prior to Admission medications   Medication Sig Start Date End Date Taking? Authorizing Provider  azithromycin (ZITHROMAX Z-PAK) 250 MG tablet Take 2 tablets today, and 1 tablet for the next 4 days. 02/07/20   Faustino Congress, NP    Family History Family History  Problem Relation Age of Onset  . Hypertension Father     Social History Social History   Tobacco Use  . Smoking status: Never Smoker  . Smokeless tobacco: Never Used  Substance Use Topics  . Alcohol use: No  . Drug use: No     Allergies   Prednisone   Review of Systems Review of Systems  Constitutional: Positive for fatigue.  HENT: Positive for voice change.      Physical Exam Triage Vital Signs ED Triage Vitals  Enc Vitals Group     BP 02/11/20 1732 134/77     Pulse Rate 02/11/20 1732 68     Resp 02/11/20 1732 17     Temp 02/11/20 1732 98.2 F (36.8 C)     Temp Source 02/11/20 1732 Oral     SpO2 02/11/20 1732 100 %     Weight --      Height --      Head  Circumference --      Peak Flow --      Pain Score 02/11/20 1730 0     Pain Loc --      Pain Edu? --      Excl. in [place]? --    No data found.  Updated Vital Signs BP 134/77 (BP Location: Right Arm)   Pulse 68   Temp 98.2 F (36.8 C) (Oral)   Resp 17   LMP 01/04/2020   SpO2 100%      Physical Exam Constitutional:      General: She is not in acute distress.    Appearance: She is well-developed. She is obese.  HENT:     Head: Normocephalic and atraumatic.     Nose: Nose normal.     Mouth/Throat:     Mouth: Mucous membranes are moist.  Pharynx: No posterior oropharyngeal erythema.  Eyes:     Conjunctiva/sclera: Conjunctivae normal.     Pupils: Pupils are equal, round, and reactive to light.  Cardiovascular:     Rate and Rhythm: Normal rate.  Pulmonary:     Effort: Pulmonary effort is normal. No respiratory distress.  Musculoskeletal:        General: Normal range of motion.     Cervical back: Normal range of motion.  Skin:    General: Skin is warm and dry.  Neurological:     Mental Status: She is alert.  Psychiatric:        Mood and Affect: Mood normal.        Behavior: Behavior normal.      UC Treatments / Results  Labs (all labs ordered are listed, but only abnormal results are displayed) Labs Reviewed - No data to display  EKG   Radiology No results found.  Procedures Procedures (including critical care time)  Medications Ordered in UC Medications - No data to display  Initial Impression / Assessment and Plan / UC Course  I have reviewed the triage vital signs and the nursing notes.  Pertinent labs & imaging results that were available during my care of the patient were reviewed by me and considered in my medical decision making (see chart for details).     Reviewed normal course of pharyngitis Reviewed symptomatic care Need ENT if fails to improve f Final Clinical Impressions(s) / UC Diagnoses   Final diagnoses:  Laryngitis, acute      Discharge Instructions     Rest voice Do not whisper Drink lots of fluids Use a humidifier See ENT if symptoms persist   ED Prescriptions    None     PDMP not reviewed this encounter.   Raylene Everts, MD 02/11/20 1810

## 2020-02-23 ENCOUNTER — Other Ambulatory Visit: Payer: Self-pay

## 2020-02-23 ENCOUNTER — Telehealth: Payer: 59 | Admitting: Family Medicine

## 2020-02-28 ENCOUNTER — Encounter: Payer: Self-pay | Admitting: Family Medicine

## 2020-02-28 ENCOUNTER — Telehealth (INDEPENDENT_AMBULATORY_CARE_PROVIDER_SITE_OTHER): Payer: 59 | Admitting: Family Medicine

## 2020-02-28 DIAGNOSIS — R05 Cough: Secondary | ICD-10-CM

## 2020-02-28 DIAGNOSIS — R058 Other specified cough: Secondary | ICD-10-CM

## 2020-02-28 NOTE — Progress Notes (Signed)
Patient ID: Robin Lopez, female   DOB: [birth year], 39 y.o.   MRN: [id number]  This visit type was conducted due to national recommendations for restrictions regarding the COVID-19 pandemic in an effort to limit this patient's exposure and mitigate transmission in our community.   Virtual Visit via Video Note  I connected with Cindra Eves on 02/28/20 at  9:00 AM EDT by a video enabled telemedicine application and verified that I am speaking with the correct person using two identifiers.  Location patient: home Location provider:work or home office Persons participating in the virtual visit: patient, provider  I discussed the limitations of evaluation and management by telemedicine and the availability of in person appointments. The patient expressed understanding and agreed to proceed.   HPI: Robin Lopez presents for establishing care.  She had recent issue with acute respiratory illness and was converted to a video visit after initially being scheduled for office visit.  She states that on June 4 she woke up with laryngitis type symptoms.  This follow-up cough and nasal congestion.  Her laryngitis symptoms are finally improved but she has had some lingering dry cough.  She had no fever.  She went to urgent care on 7 June was diagnosed with upper respiratory infection and treated with Zithromax.  Covid testing reportedly negative.  She has taken Delsym for her dry cough which has helped.  She basically needs return note back to work.  She has been out since the fourth because of laryngitis symptoms and inability to talk and has projected return to work date July 1  Past medical history reviewed.  Question of IBS type symptoms in the past year.  She is currently separated for the past year from her husband and feels that her GI symptoms improved after separating from him.  She takes no medications.  Takes some over-the-counter supplements.  She has prior intolerance with prednisone but no  true drug allergies  Social history -separated as above.  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  She quit smoking over 10 years ago.  She has 2 children.   Surgical history-C-section 2001 and 2003.  Bilateral tubal ligation 2003.  Cholecystectomy 2012   ROS: See pertinent positives and negatives per HPI.  No past medical history on file.  Past Surgical History:  Procedure Laterality Date  . CESAREAN SECTION    . CHOLECYSTECTOMY    . TUBAL LIGATION    . WISDOM TOOTH EXTRACTION      Family History  Problem Relation Age of Onset  . Hypertension Father     SOCIAL HX: Quit smoking over 10 years ago.  No regular alcohol use.  Currently separated from husband.  Works Theme park manager in hearing devices   Current Outpatient Medications:  .  azithromycin (ZITHROMAX Z-PAK) 250 MG tablet, Take 2 tablets today, and 1 tablet for the next 4 days., Disp: 6 tablet, Rfl: 0  EXAM:  VITALS per patient if applicable:  GENERAL: alert, oriented, appears well and in no acute distress  HEENT: atraumatic, conjunttiva clear, no obvious abnormalities on inspection of external nose and ears  NECK: normal movements of the head and neck  LUNGS: on inspection no signs of respiratory distress, breathing rate appears normal, no obvious gross SOB, gasping or wheezing  CV: no obvious cyanosis  MS: moves all visible extremities without noticeable abnormality  PSYCH/NEURO: pleasant and cooperative, no obvious depression or anxiety, speech and thought processing grossly intact  ASSESSMENT AND PLAN:  Discussed the following  assessment and plan:  Persistent dry cough.  Suspect residual from recent viral URI.  She does not describe any red flag symptoms such as unexplained weight loss, dyspnea, fever, hemoptysis, wheezing  -We will complete paperwork for her to return to work July 1. -If cough not fully resolved in 2 weeks be in touch for follow-up. -Continue over-the-counter Delsym  as needed for cough     I discussed the assessment and treatment plan with the patient. The patient was provided an opportunity to ask questions and all were answered. The patient agreed with the plan and demonstrated an understanding of the instructions.   The patient was advised to call back or seek an in-person evaluation if the symptoms worsen or if the condition fails to improve as anticipated.     Carolann Littler, MD

## 2020-04-21 ENCOUNTER — Encounter (HOSPITAL_COMMUNITY): Payer: Self-pay

## 2020-04-21 ENCOUNTER — Emergency Department (HOSPITAL_COMMUNITY): Payer: 59

## 2020-04-21 ENCOUNTER — Emergency Department (HOSPITAL_COMMUNITY)
Admission: EM | Admit: 2020-04-21 | Discharge: 2020-04-22 | Disposition: A | Discharge status: Home / Self Care | Payer: 59 | Attending: Emergency Medicine | Admitting: Emergency Medicine

## 2020-04-21 DIAGNOSIS — R079 Chest pain, unspecified: Secondary | ICD-10-CM | POA: Insufficient documentation

## 2020-04-21 DIAGNOSIS — R202 Paresthesia of skin: Secondary | ICD-10-CM | POA: Insufficient documentation

## 2020-04-21 DIAGNOSIS — M79602 Pain in left arm: Secondary | ICD-10-CM | POA: Insufficient documentation

## 2020-04-21 DIAGNOSIS — Z5321 Procedure and treatment not carried out due to patient leaving prior to being seen by health care provider: Secondary | ICD-10-CM | POA: Diagnosis not present

## 2020-04-21 DIAGNOSIS — R600 Localized edema: Secondary | ICD-10-CM | POA: Insufficient documentation

## 2020-04-21 LAB — CBC
HCT: 38.4 % (ref 36.0–46.0)
Hemoglobin: 12.3 g/dL (ref 12.0–15.0)
MCH: 26.4 pg (ref 26.0–34.0)
MCHC: 32 g/dL (ref 30.0–36.0)
MCV: 82.4 fL (ref 80.0–100.0)
Platelets: 380 10*3/uL (ref 150–400)
RBC: 4.66 MIL/uL (ref 3.87–5.11)
RDW: 15.6 % — ABNORMAL HIGH (ref 11.5–15.5)
WBC: 13 10*3/uL — ABNORMAL HIGH (ref 4.0–10.5)
nRBC: 0 % (ref 0.0–0.2)

## 2020-04-21 LAB — BASIC METABOLIC PANEL
Anion gap: 13 (ref 5–15)
BUN: 12 mg/dL (ref 6–20)
CO2: 21 mmol/L — ABNORMAL LOW (ref 22–32)
Calcium: 9.2 mg/dL (ref 8.9–10.3)
Chloride: 105 mmol/L (ref 98–111)
Creatinine, Ser: 0.86 mg/dL (ref 0.44–1.00)
GFR calc Af Amer: >60 mL/min (ref >60)
GFR calc non Af Amer: >60 mL/min (ref >60)
Glucose, Bld: 96 mg/dL (ref 70–99)
Potassium: 4 mmol/L (ref 3.5–5.1)
Sodium: 139 mmol/L (ref 135–145)

## 2020-04-21 LAB — I-STAT BETA HCG BLOOD, ED (MC, WL, AP ONLY): I-stat hCG, quantitative: <5 m[IU]/mL (ref <5)

## 2020-04-21 LAB — TROPONIN I (HIGH SENSITIVITY): Troponin I (High Sensitivity): 5 ng/L (ref <18)

## 2020-04-21 NOTE — ED Triage Notes (Addendum)
Pt arrives POV for eval of severe chest pain radiating to L jaw and L arm, onset this AM. Pt also reports she noted blt ankle edema x 1 week. Pt reports tingling to L arm after pain subsided this evening. No neuro deficits in triage, reports tingling to L arm ongoing.

## 2020-04-22 LAB — TROPONIN I (HIGH SENSITIVITY): Troponin I (High Sensitivity): 4 ng/L (ref <18)

## 2020-04-22 NOTE — ED Notes (Signed)
Pt stated that se was leaving because she had already see her results in mychart

## 2020-05-10 ENCOUNTER — Encounter: Payer: Self-pay | Admitting: Family Medicine

## 2020-05-10 ENCOUNTER — Other Ambulatory Visit: Payer: Self-pay

## 2020-05-10 ENCOUNTER — Ambulatory Visit (INDEPENDENT_AMBULATORY_CARE_PROVIDER_SITE_OTHER): Payer: 59 | Admitting: Family Medicine

## 2020-05-10 VITALS — BP 140/82 | HR 84 | Temp 98.4°F | Ht 67.0 in | Wt 242.0 lb

## 2020-05-10 DIAGNOSIS — R0789 Other chest pain: Secondary | ICD-10-CM

## 2020-05-10 NOTE — Progress Notes (Addendum)
Established Patient Office Visit  Subjective:  Patient ID: Robin Lopez, female    DOB: [birth year]  Age: 39 y.o. MRN: [id number]  CC:  Chief Complaint  Patient presents with  . Chest Pain    f/u ER    HPI Robin Lopez presents for recent episodes of atypical chest pain.  She had episode back around the middle of August and ended up going to the ER August 23 for similar episode.  These have occurred at rest.  She describes her chest feeling "tight "substernally.  Occasional radiation to the back.  She denies any active GERD symptoms.  No dysphagia.  No clear provoking factors.  ER notes reviewed.  She had EKG which showed sinus tachycardia with rate 117.  Chest x-ray showed no acute findings.  Troponins were negative.  CBC and chemistries unremarkable.  Patient states she waited about 13 hours and ended up leaving.  Her symptoms have never been exertional.  She is not aware of any persistent elevated pulse.  She states she had a brother with history of DVT.  Robin Lopez has never had any calf pain.  No pleuritic pain.  She is concerned about pulmonary embolus risk.  Her symptoms have been very intermittent.  She quit smoking 10 years ago.  Positive family history of CAD in her father around age 62.  Robin Lopez has no history of diabetes.  No recent lipids on file  History reviewed. No pertinent past medical history.  Past Surgical History:  Procedure Laterality Date  . CESAREAN SECTION    . CHOLECYSTECTOMY    . TUBAL LIGATION    . WISDOM TOOTH EXTRACTION      Family History  Problem Relation Age of Onset  . Hypertension Father     Social History   Socioeconomic History  . Marital status: Legally Separated    Spouse name: Not on file  . Number of children: Not on file  . Years of education: Not on file  . Highest education level: Not on file  Occupational History  . Not on file  Tobacco Use  . Smoking status: Never Smoker  . Smokeless tobacco: Never Used  Substance and  Sexual Activity  . Alcohol use: No  . Drug use: No  . Sexual activity: Yes    Birth control/protection: Surgical  Other Topics Concern  . Not on file  Social History Narrative  . Not on file   Social Determinants of Health   Financial Resource Strain:   . Difficulty of Paying Living Expenses: Not on file  Food Insecurity:   . Worried About Charity fundraiser in the Last Year: Not on file  . Ran Out of Food in the Last Year: Not on file  Transportation Needs:   . Lack of Transportation (Medical): Not on file  . Lack of Transportation (Non-Medical): Not on file  Physical Activity:   . Days of Exercise per Week: Not on file  . Minutes of Exercise per Session: Not on file  Stress:   . Feeling of Stress : Not on file  Social Connections:   . Frequency of Communication with Friends and Family: Not on file  . Frequency of Social Gatherings with Friends and Family: Not on file  . Attends Religious Services: Not on file  . Active Member of Clubs or Organizations: Not on file  . Attends Archivist Meetings: Not on file  . Marital Status: Not on file  Intimate Partner Violence:   . Fear of  Current or Ex-Partner: Not on file  . Emotionally Abused: Not on file  . Physically Abused: Not on file  . Sexually Abused: Not on file    Outpatient Medications Prior to Visit  Medication Sig Dispense Refill  . azithromycin (ZITHROMAX Z-PAK) 250 MG tablet Take 2 tablets today, and 1 tablet for the next 4 days. 6 tablet 0   No facility-administered medications prior to visit.    Allergies  Allergen Reactions  . Prednisone Itching and Rash    ROS Review of Systems  Constitutional: Negative for appetite change and unexpected weight change.  Respiratory: Negative for cough and shortness of breath.   Cardiovascular: Positive for chest pain and leg swelling. Negative for palpitations.       He relates some bilateral leg edema in recent weeks  Gastrointestinal: Negative for  abdominal pain.  Genitourinary: Negative for dysuria.  Neurological: Negative for dizziness and syncope.      Objective:    Physical Exam Vitals reviewed.  Constitutional:      Appearance: She is well-developed.  Cardiovascular:     Rate and Rhythm: Normal rate and regular rhythm.     Heart sounds: Normal heart sounds.  Pulmonary:     Effort: Pulmonary effort is normal.     Breath sounds: Normal breath sounds. No decreased breath sounds, wheezing or rales.  Musculoskeletal:     Right lower leg: No edema.     Left lower leg: No edema.     Comments: No calf tenderness  Neurological:     Mental Status: She is alert.     BP 140/82   Pulse 84   Temp 98.4 F (36.9 C) (Other (Comment))   Ht 5\' 7"  (1.702 m)   Wt 242 lb (109.8 kg)   LMP 04/22/2020   SpO2 99%   BMI 37.90 kg/m  Wt Readings from Last 3 Encounters:  05/10/20 242 lb (109.8 kg)  04/21/20 241 lb (109.3 kg)  06/09/19 233 lb 6.4 oz (105.9 kg)     Health Maintenance Due  Topic Date Due  . Hepatitis C Screening  Never done  . COVID-19 Vaccine (1) Never done  . HIV Screening  Never done  . TETANUS/TDAP  Never done  . PAP SMEAR-Modifier  Never done  . INFLUENZA VACCINE  Never done    There are no preventive care reminders to display for this patient.  Lab Results  Component Value Date   TSH 2.898 06/05/2018   Lab Results  Component Value Date   WBC 13.0 (H) 04/21/2020   HGB 12.3 04/21/2020   HCT 38.4 04/21/2020   MCV 82.4 04/21/2020   PLT 380 04/21/2020   Lab Results  Component Value Date   NA 139 04/21/2020   K 4.0 04/21/2020   CO2 21 (L) 04/21/2020   GLUCOSE 96 04/21/2020   BUN 12 04/21/2020   CREATININE 0.86 04/21/2020   BILITOT 0.4 06/05/2018   ALKPHOS 78 06/05/2018   AST 15 06/05/2018   ALT 12 06/05/2018   PROT 6.1 (L) 06/05/2018   ALBUMIN 3.4 (L) 06/05/2018   CALCIUM 9.2 04/21/2020   ANIONGAP 13 04/21/2020   No results found for: CHOL No results found for: HDL No results found  for: LDLCALC No results found for: TRIG No results found for: CHOLHDL No results found for: HGBA1C    Assessment & Plan:   Problem List Items Addressed This Visit    None    Visit Diagnoses    Atypical chest pain    -  Primary   Relevant Orders   D-dimer, Quantitative    Patient has had several episodes of atypical chest pain over the past weeks.  Recent ER evaluation as above with EKG showing sinus tachycardia and chest x-ray unremarkable.  Her heart rate is back down to normal range today.  She does have positive family history of premature CAD as above and father and also history of DVT in her brother.  Clinical suspicion for acute DVT is relatively low  -We will check D-dimer.  If negative, consider further cardiology evaluation even though her pretest probability would appear to be relatively low for 39 year old female non-smoker, nondiabetic.  Her symptoms are atypical in terms of occurring at rest and never related to exertion  No orders of the defined types were placed in this encounter.   Follow-up: No follow-ups on file.    Carolann Littler, MD   D-dimer negative.  Will set up Cardiology referral.    [name] MD [facility]

## 2020-05-10 NOTE — Patient Instructions (Addendum)

## 2020-05-11 ENCOUNTER — Encounter: Payer: Self-pay | Admitting: Family Medicine

## 2020-05-11 LAB — D-DIMER, QUANTITATIVE: D-Dimer, Quant: 0.46 ug{FEU}/mL

## 2020-05-11 NOTE — Addendum Note (Signed)
Addended by: [name] on: 05/11/2020 12:16 PM   Modules accepted: Orders

## 2020-05-12 NOTE — Telephone Encounter (Signed)
FYI

## 2020-06-14 ENCOUNTER — Ambulatory Visit: Payer: 59 | Admitting: Cardiology

## 2020-07-06 ENCOUNTER — Other Ambulatory Visit: Payer: Self-pay

## 2020-07-06 ENCOUNTER — Ambulatory Visit (INDEPENDENT_AMBULATORY_CARE_PROVIDER_SITE_OTHER): Payer: 59 | Admitting: Cardiology

## 2020-07-06 ENCOUNTER — Encounter: Payer: Self-pay | Admitting: Cardiology

## 2020-07-06 VITALS — BP 134/100 | HR 84 | Ht 66.0 in | Wt 244.2 lb

## 2020-07-06 DIAGNOSIS — R030 Elevated blood-pressure reading, without diagnosis of hypertension: Secondary | ICD-10-CM | POA: Diagnosis not present

## 2020-07-06 DIAGNOSIS — E669 Obesity, unspecified: Secondary | ICD-10-CM

## 2020-07-06 DIAGNOSIS — R072 Precordial pain: Secondary | ICD-10-CM

## 2020-07-06 MED ORDER — METOPROLOL TARTRATE 100 MG PO TABS: ORAL_TABLET | ORAL | 0 refills | Status: DC

## 2020-07-06 NOTE — Patient Instructions (Addendum)
Medication Instructions:  Your physician recommends that you continue on your current medications as directed. Please refer to the Current Medication list given to you today.  *If you need a refill on your cardiac medications before your next appointment, please call your pharmacy*   Lab Work: BMET today  If you have labs (blood work) drawn today and your tests are completely normal, you will receive your results only by: Marland Kitchen MyChart Message (if you have MyChart) OR . A paper copy in the mail If you have any lab test that is abnormal or we need to change your treatment, we will call you to review the results.   Testing/Procedures: Your physician recommends that you have a Coronary CT performed.   Follow-Up: At [facility], you and your health needs are our priority.  As part of our continuing mission to provide you with exceptional heart care, we have created designated Provider Care Teams.  These Care Teams include your primary Cardiologist (physician) and Advanced Practice Providers (APPs -  Physician Assistants and Nurse Practitioners) who all work together to provide you with the care you need, when you need it.  We recommend signing up for the patient portal called "MyChart".  Sign up information is provided on this After Visit Summary.  MyChart is used to connect with patients for Virtual Visits (Telemedicine).  Patients are able to view lab/test results, encounter notes, upcoming appointments, etc.  Non-urgent messages can be sent to your provider as well.   To learn more about what you can do with MyChart, go to [email or web address].    Your next appointment:   As needed  The format for your next appointment:   In Person  Provider:   You may see Fransico Him, MD or one of the following Advanced Practice Providers on your designated Care Team:    Melina Copa, PA-C  Ermalinda Barrios, PA-C    Other Instructions  Your cardiac CT will be scheduled at one of the below  locations:   [facility] [address] [place], [place] [address] [phone]  [place] [address] [place], [place] [address] [phone]  If scheduled at [facility], please arrive at the [facility] main entrance of [facility] 30 minutes prior to test start time. Proceed to the [facility] Radiology Department (first floor) to check-in and test prep.  If scheduled at [facility], please arrive 15 mins early for check-in and test prep.  Please follow these instructions carefully (unless otherwise directed):  On the Night Before the Test: . Be sure to Drink plenty of water. . Do not consume any caffeinated/decaffeinated beverages or chocolate 12 hours prior to your test. . Do not take any antihistamines 12 hours prior to your test.  On the Day of the Test: . Drink plenty of water. Do not drink any water within one hour of the test. . Do not eat any food 4 hours prior to the test. . You may take your regular medications prior to the test.  . Take metoprolol (Lopressor) two hours prior to test. . HOLD Furosemide/Hydrochlorothiazide morning of the test. . FEMALES- please wear underwire-free bra if available       After the Test: . Drink plenty of water. . After receiving IV contrast, you may experience a mild flushed feeling. This is normal. . On occasion, you may experience a mild rash up to 24 hours after the test. This  is not dangerous. If this occurs, you can take Benadryl 25 mg and increase your fluid intake. . If you experience trouble breathing, this can be serious. If it is severe call 911 IMMEDIATELY. If it is mild, please call our office. . If you take any of these medications: Glipizide/Metformin, Avandament, Glucavance, please do not take 48 hours after completing test unless otherwise instructed.   Once we have confirmed authorization from your  insurance company, we will call you to set up a date and time for your test. Based on how quickly your insurance processes prior authorizations requests, please allow up to 4 weeks to be contacted for scheduling your Cardiac CT appointment. Be advised that routine Cardiac CT appointments could be scheduled as many as 8 weeks after your provider has ordered it.  For non-scheduling related questions, please contact the cardiac imaging nurse navigator should you have any questions/concerns: Marchia Bond, Cardiac Imaging Nurse Navigator Burley Saver, Interim Cardiac Imaging Nurse [place] [facility] Direct Office Dial: [phone]   For scheduling needs, including cancellations and rescheduling, please call Vivien Rota at [phone], option 3.

## 2020-07-06 NOTE — Progress Notes (Signed)
Cardiology Consult  Note    Date:  07/06/2020   ID:  Cindra Eves, DOB [birth year], MRN [id number]  PCP:  [name], MD  Cardiologist:  Fransico Him, MD   Chief Complaint  Patient presents with  . New Patient (Initial Visit)    Chest pain    History of Present Illness:  Robin Lopez is a 40 y.o. female who is being seen today for the evaluation of atypical Chest pain at the request of Burchette, Alinda Sierras, MD.  This is a 39yo obese female with no prior PMH who recently was seen by her PCP and complained of chest pain. She says that it started in August and mainly occurs at night and wakes her up at night.  She describes it as a pressure that becomes so severe she cannot breathe.  She has had a lot of nausea recently.  She will not be able to get back to sleep because she cannot get back to sleep.  She will be so nauseated that she cannot get back to sleep.  She denies any sour taste in her mouth or congestion in her throat.  There is no relation to what she eats the night before.  She will break out in a cold sweat with the discomfort.    She has had some DOE and chest pressure when going up stairs and has also noticed it when sexually active which is new.  She went to the ER after she got the CP and it radiated into her jaw and left arm.  It usually lasts anywhere from a few minutes to over an hour.  ASA makes it better.  She initially thought it was GERD and tried OTC acid relievers but nothing helps.   She went to the ER in August because it was so severe and cardiac workup was normal with hsTrop 5>4 and EKG was normal  DDimer was normal.  She used to smoke but quit a year ago.  Her biological father had a massive MI but dose not know what his condition is currently.  He was 55 at the time.    History reviewed. No pertinent past medical history.  Past Surgical History:  Procedure Laterality Date  . CESAREAN SECTION     x 2  . CHOLECYSTECTOMY    . TUBAL LIGATION    .  WISDOM TOOTH EXTRACTION      Current Medications: No outpatient medications have been marked as taking for the 07/06/20 encounter (Office Visit) with Sueanne Margarita, MD.    Allergies:   Prednisone   Social History   Socioeconomic History  . Marital status: Legally Separated    Spouse name: Not on file  . Number of children: Not on file  . Years of education: Not on file  . Highest education level: Not on file  Occupational History  . Not on file  Tobacco Use  . Smoking status: Never Smoker  . Smokeless tobacco: Never Used  Substance and Sexual Activity  . Alcohol use: No  . Drug use: No  . Sexual activity: Yes    Birth control/protection: Surgical  Other Topics Concern  . Not on file  Social History Narrative  . Not on file   Social Determinants of Health   Financial Resource Strain:   . Difficulty of Paying Living Expenses: Not on file  Food Insecurity:   . Worried About Charity fundraiser in the Last Year: Not on file  . Ran Out  of Food in the Last Year: Not on file  Transportation Needs:   . Lack of Transportation (Medical): Not on file  . Lack of Transportation (Non-Medical): Not on file  Physical Activity:   . Days of Exercise per Week: Not on file  . Minutes of Exercise per Session: Not on file  Stress:   . Feeling of Stress : Not on file  Social Connections:   . Frequency of Communication with Friends and Family: Not on file  . Frequency of Social Gatherings with Friends and Family: Not on file  . Attends Religious Services: Not on file  . Active Member of Clubs or Organizations: Not on file  . Attends Archivist Meetings: Not on file  . Marital Status: Not on file     Family History:  The patient's family history includes CAD in her father; Deep vein thrombosis in her brother; Hypertension in her father.   ROS:   Please see the history of present illness.    ROS All other systems reviewed and are negative.  No flowsheet data  found.     PHYSICAL EXAM:   VS:  BP (!) 134/100   Pulse 84   Ht '5\' 6"'  (1.676 m)   Wt 244 lb 3.2 oz (110.8 kg)   SpO2 99%   BMI 39.41 kg/m    GEN: Well nourished, well developed, in no acute distress  HEENT: normal  Neck: no JVD, carotid bruits, or masses Cardiac: RRR; no murmurs, rubs, or gallops,no edema.  Intact distal pulses bilaterally.  Respiratory:  clear to auscultation bilaterally, normal work of breathing GI: soft, nontender, nondistended, + BS MS: no deformity or atrophy  Skin: warm and dry, no rash Neuro:  Alert and Oriented x 3, Strength and sensation are intact Psych: euthymic mood, full affect  Wt Readings from Last 3 Encounters:  07/06/20 244 lb 3.2 oz (110.8 kg)  05/10/20 242 lb (109.8 kg)  04/21/20 241 lb (109.3 kg)      Studies/Labs Reviewed:   EKG:  EKG is ordered today.  The ekg ordered today demonstrates NSR with no ST changes  Recent Labs: 04/21/2020: BUN 12; Creatinine, Ser 0.86; Hemoglobin 12.3; Platelets 380; Potassium 4.0; Sodium 139   Lipid Panel No results found for: CHOL, TRIG, HDL, CHOLHDL, VLDL, LDLCALC, LDLDIRECT  Additional studies/ records that were reviewed today include:  OV notes from PCP    ASSESSMENT:    1. Precordial pain   2. Obesity (BMI 30-39.9)   3. Elevated BP without diagnosis of hypertension      PLAN:  In order of problems listed above:  1. Chest pain -somewhat atypical but still concerning.  It is severe and wakes her up at night and reflux meds have not helped.  ? Coronary Vasospasm -she has been a smoker in the past and has a fm hx of her father having an MI in this 66's so need to consider CAD -also need to consider anomalous coronary artery -will get a coronary CTA to assess for anomalous coronary artery and CAD -if CT neg for coronary pathology then will start on nitrates which will help with esophageal spasm or coronary vasospasm  2.  Obesity -I have encouraged her to get into a routine exercise  program and cut back on carbs and portions.   3.  Elevated BP -she has no dx of HTN -I have asked her to check her BP daily for a week and call with results    Medication Adjustments/Labs  and Tests Ordered: Current medicines are reviewed at length with the patient today.  Concerns regarding medicines are outlined above.  Medication changes, Labs and Tests ordered today are listed in the Patient Instructions below.  Patient Instructions  Medication Instructions:  Your physician recommends that you continue on your current medications as directed. Please refer to the Current Medication list given to you today.  *If you need a refill on your cardiac medications before your next appointment, please call your pharmacy*   Lab Work: BMET today  If you have labs (blood work) drawn today and your tests are completely normal, you will receive your results only by: Marland Kitchen MyChart Message (if you have MyChart) OR . A paper copy in the mail If you have any lab test that is abnormal or we need to change your treatment, we will call you to review the results.   Testing/Procedures: Your physician recommends that you have a Coronary CT performed.   Follow-Up: At [facility], you and your health needs are our priority.  As part of our continuing mission to provide you with exceptional heart care, we have created designated Provider Care Teams.  These Care Teams include your primary Cardiologist (physician) and Advanced Practice Providers (APPs -  Physician Assistants and Nurse Practitioners) who all work together to provide you with the care you need, when you need it.  We recommend signing up for the patient portal called "MyChart".  Sign up information is provided on this After Visit Summary.  MyChart is used to connect with patients for Virtual Visits (Telemedicine).  Patients are able to view lab/test results, encounter notes, upcoming appointments, etc.  Non-urgent messages can be sent to your  provider as well.   To learn more about what you can do with MyChart, go to [email or web address].    Your next appointment:   As needed  The format for your next appointment:   In Person  Provider:   You may see Fransico Him, MD or one of the following Advanced Practice Providers on your designated Care Team:    Melina Copa, PA-C  Ermalinda Barrios, PA-C    Other Instructions  Your cardiac CT will be scheduled at one of the below locations:   [facility] [address] [place], [place] [address] [phone]  [place] [address] [place], [place] [address] [phone]  If scheduled at [facility], please arrive at the [facility] main entrance of [facility] 30 minutes prior to test start time. Proceed to the [facility] Radiology Department (first floor) to check-in and test prep.  If scheduled at [facility], please arrive 15 mins early for check-in and test prep.  Please follow these instructions carefully (unless otherwise directed):  On the Night Before the Test: . Be sure to Drink plenty of water. . Do not consume any caffeinated/decaffeinated beverages or chocolate 12 hours prior to your test. . Do not take any antihistamines 12 hours prior to your test.  On the Day of the Test: . Drink plenty of water. Do not drink any water within one hour of the test. . Do not eat any food 4 hours prior to the test. . You may take your regular medications prior to the test.  . Take metoprolol (Lopressor) two hours prior to test. . HOLD Furosemide/Hydrochlorothiazide morning of the test. . FEMALES- please wear underwire-free bra if available       After  the Test: . Drink plenty of water. . After receiving IV contrast, you may experience a mild flushed feeling. This is normal. . On occasion, you may experience a mild rash up to 24 hours after the test. This is  not dangerous. If this occurs, you can take Benadryl 25 mg and increase your fluid intake. . If you experience trouble breathing, this can be serious. If it is severe call 911 IMMEDIATELY. If it is mild, please call our office. . If you take any of these medications: Glipizide/Metformin, Avandament, Glucavance, please do not take 48 hours after completing test unless otherwise instructed.   Once we have confirmed authorization from your insurance company, we will call you to set up a date and time for your test. Based on how quickly your insurance processes prior authorizations requests, please allow up to 4 weeks to be contacted for scheduling your Cardiac CT appointment. Be advised that routine Cardiac CT appointments could be scheduled as many as 8 weeks after your provider has ordered it.  For non-scheduling related questions, please contact the cardiac imaging nurse navigator should you have any questions/concerns: Marchia Bond, Cardiac Imaging Nurse Navigator Burley Saver, Interim Cardiac Imaging Nurse [place] [facility] Direct Office Dial: [phone]   For scheduling needs, including cancellations and rescheduling, please call Vivien Rota at [phone], option 3.        Signed, Fransico Him, MD  07/06/2020 3:17 PM    [facility] [address], [place], [place]  [address] Phone: [phone]; Fax: [phone]

## 2020-07-07 LAB — BASIC METABOLIC PANEL
BUN/Creatinine Ratio: 14 (ref 9–23)
BUN: 13 mg/dL (ref 6–20)
CO2: 24 mmol/L (ref 20–29)
Calcium: 9.5 mg/dL (ref 8.7–10.2)
Chloride: 102 mmol/L (ref 96–106)
Creatinine, Ser: 0.94 mg/dL (ref 0.57–1.00)
GFR calc Af Amer: 88 mL/min/{1.73_m2} (ref >59)
GFR calc non Af Amer: 77 mL/min/{1.73_m2} (ref >59)
Glucose: 87 mg/dL (ref 65–99)
Potassium: 4.7 mmol/L (ref 3.5–5.2)
Sodium: 141 mmol/L (ref 134–144)

## 2020-07-12 ENCOUNTER — Emergency Department (HOSPITAL_COMMUNITY)
Admission: EM | Admit: 2020-07-12 | Discharge: 2020-07-13 | Disposition: A | Discharge status: Home / Self Care | Payer: 59 | Attending: Emergency Medicine | Admitting: Emergency Medicine

## 2020-07-12 ENCOUNTER — Other Ambulatory Visit: Payer: Self-pay

## 2020-07-12 ENCOUNTER — Encounter (HOSPITAL_COMMUNITY): Payer: Self-pay | Admitting: *Deleted

## 2020-07-12 DIAGNOSIS — E669 Obesity, unspecified: Secondary | ICD-10-CM | POA: Diagnosis not present

## 2020-07-12 DIAGNOSIS — R609 Edema, unspecified: Secondary | ICD-10-CM | POA: Insufficient documentation

## 2020-07-12 DIAGNOSIS — M79605 Pain in left leg: Secondary | ICD-10-CM | POA: Insufficient documentation

## 2020-07-12 LAB — COMPREHENSIVE METABOLIC PANEL
ALT: 15 U/L (ref 0–44)
AST: 19 U/L (ref 15–41)
Albumin: 3.7 g/dL (ref 3.5–5.0)
Alkaline Phosphatase: 106 U/L (ref 38–126)
Anion gap: 10 (ref 5–15)
BUN: 15 mg/dL (ref 6–20)
CO2: 24 mmol/L (ref 22–32)
Calcium: 8.7 mg/dL — ABNORMAL LOW (ref 8.9–10.3)
Chloride: 103 mmol/L (ref 98–111)
Creatinine, Ser: 0.86 mg/dL (ref 0.44–1.00)
GFR, Estimated: >60 mL/min (ref >60)
Glucose, Bld: 88 mg/dL (ref 70–99)
Potassium: 4.6 mmol/L (ref 3.5–5.1)
Sodium: 137 mmol/L (ref 135–145)
Total Bilirubin: 0.5 mg/dL (ref 0.3–1.2)
Total Protein: 6.9 g/dL (ref 6.5–8.1)

## 2020-07-12 LAB — CBC
HCT: 37.1 % (ref 36.0–46.0)
Hemoglobin: 12.2 g/dL (ref 12.0–15.0)
MCH: 27.7 pg (ref 26.0–34.0)
MCHC: 32.9 g/dL (ref 30.0–36.0)
MCV: 84.1 fL (ref 80.0–100.0)
Platelets: 351 10*3/uL (ref 150–400)
RBC: 4.41 MIL/uL (ref 3.87–5.11)
RDW: 14.6 % (ref 11.5–15.5)
WBC: 14.1 10*3/uL — ABNORMAL HIGH (ref 4.0–10.5)
nRBC: 0 % (ref 0.0–0.2)

## 2020-07-12 LAB — I-STAT BETA HCG BLOOD, ED (MC, WL, AP ONLY): I-stat hCG, quantitative: <5 m[IU]/mL (ref <5)

## 2020-07-12 LAB — PROTIME-INR
INR: 1 (ref 0.8–1.2)
Prothrombin Time: 12.3 seconds (ref 11.4–15.2)

## 2020-07-12 MED ORDER — ENOXAPARIN SODIUM 120 MG/0.8ML ~~LOC~~ SOLN
1.0000 mg/kg | Freq: Once | SUBCUTANEOUS | Status: AC
  Administered 2020-07-13: 108 mg via SUBCUTANEOUS
  Filled 2020-07-12: qty 0.72

## 2020-07-12 NOTE — Discharge Instructions (Signed)
You were seen in the emergency department for left leg swelling, toe discoloration and numbness.  We have ordered an ultrasound to rule out DVT.  Please follow-up in the morning for this ultrasound.  You have been given a dose of Lovenox in case there is a DVT.  This medication will last for 12 hours.  Please follow-up with your primary care physician.  If you notice that the discoloration worsens, you have a cold or blue foot, chest pain or shortness of breath, redness and warmth or fever of 100.4 or higher, please return to the emergency department.  I recommend compression stockings to help with swelling in your legs.  Also recommend getting up every 2 hours for at least 5 to 10 minutes to walk around while working at home.

## 2020-07-12 NOTE — ED Triage Notes (Signed)
The pt has pain in her lt little toe yesterday and it turned blue  Today she has pain or cramps in her entire lt leg   No known injury.  She is getting worked up for a possible heart problem  lmp October 8th

## 2020-07-12 NOTE — Telephone Encounter (Signed)
Spoke with the patient and advised her to go to the ER for evaluation. Patient verbalized understanding and states that she is going to go to ER.

## 2020-07-12 NOTE — ED Provider Notes (Signed)
TIME SEEN: 11:44 PM  CHIEF COMPLAINT: Left fifth toe discoloration and numbness, leg swelling  HPI: Patient is a 39 year old female with no significant past medical history who presents to the emergency department with numbness and purple discoloration to her left fifth toe that she noticed yesterday.  She states the discoloration has improved and now the toe appears more red.  It is not hot.  No fever.  No chest pain or shortness of breath.  No lightheadedness or near syncopal events but reports intermittent episodes of vertigo today.  States these episodes are very brief.  No history of PE, DVT, exogenous estrogen use, recent fractures, surgery, trauma, hospitalization, prolonged travel or other immobilization.  No calf tenderness currently.  Does have a brother with history of PE and DVT after a flight.  No history of clotting disorder.  Not on blood thinners.  She denies any injury.  Numbness is improving.  No current pain.  Reports leg swelling has improved.  She does report that she works from home and does spend many hours sitting at her desk.  States she called her cardiologist and they instructed that she come to the emergency department for evaluation.  She is seeing cardiology for episode of chest pain that she had previously and is awaiting a coronary CT.  States that her cardiologist suspect she has having vasospasm.    ROS: See HPI Constitutional: no fever  Eyes: no drainage  ENT: no runny nose   Cardiovascular:  no chest pain  Resp: no SOB  GI: no vomiting GU: no dysuria Integumentary: no rash  Allergy: no hives  Musculoskeletal:  leg swelling  Neurological: no slurred speech ROS otherwise negative  PAST MEDICAL HISTORY/PAST SURGICAL HISTORY:  History reviewed. No pertinent past medical history.  MEDICATIONS:  Prior to Admission medications   Medication Sig Start Date End Date Taking? Authorizing Provider  metoprolol tartrate (LOPRESSOR) 100 MG tablet Take one tablet by mouth  2 hours prior to CT 07/06/20   Sueanne Margarita, MD    ALLERGIES:  Allergies  Allergen Reactions  . Prednisone Itching and Rash    SOCIAL HISTORY:  Social History   Tobacco Use  . Smoking status: Never Smoker  . Smokeless tobacco: Never Used  Substance Use Topics  . Alcohol use: No    FAMILY HISTORY: Family History  Problem Relation Age of Onset  . Hypertension Father   . CAD Father   . Deep vein thrombosis Brother     EXAM: BP 129/78   Pulse 79   Temp 98.3 F (36.8 C) (Oral)   Resp 17   Ht 5\' 6"  (1.676 m)   Wt 108.9 kg   LMP 06/09/2020   SpO2 98%   BMI 38.74 kg/m  CONSTITUTIONAL: Alert and oriented and responds appropriately to questions. Well-appearing; well-nourished HEAD: Normocephalic EYES: Conjunctivae clear, pupils appear equal, EOM appear intact ENT: normal nose; moist mucous membranes NECK: Supple, normal ROM CARD: RRR; S1 and S2 appreciated; no murmurs, no clicks, no rubs, no gallops RESP: Normal chest excursion without splinting or tachypnea; breath sounds clear and equal bilaterally; no wheezes, no rhonchi, no rales, no hypoxia or respiratory distress, speaking full sentences ABD/GI: Normal bowel sounds; non-distended; soft, non-tender, no rebound, no guarding, no peritoneal signs, no hepatosplenomegaly BACK:  The back appears normal EXT: Normal ROM in all joints; no deformity noted, no significant edema noted and no leg asymmetry appreciated; no cyanosis, 2+ DP and PT pulse in the left foot that is both  dopplered and palpable on exam, minimal redness to the left fifth toe compared to the other toes without other discoloration, no bony tenderness or bony deformity noted to the left foot or left fifth toe, no joint effusion, full range of motion in all joints of the left foot, no calf tenderness, no streaking, both feet are warm to touch, reports normal sensation throughout the left foot SKIN: Normal color for age and race; warm; no rash on exposed  skin NEURO: Moves all extremities equally PSYCH: The patient's mood and manner are appropriate.   MEDICAL DECISION MAKING: Patient here with left fifth toe discoloration, numbness and leg swelling that appears to be improving.  I am not concerned for arterial obstruction at this time given she has strong biphasic DP and PT pulse.  There is no cyanosis present currently.  She denies any injury and has no bony tenderness on exam to suggest fracture.  Toe is slightly more red compared to the other toes but does not appear to be cellulitis, septic arthritis, gout at this time.  States she was sent by her cardiologist to rule out DVT.  Unfortunate this time unable to obtain vascular ultrasound.  Patient is amenable to come back in the morning to have the study done is an outpatient.  Will give dose of prophylactic Lovenox.  She denies any chest pain or shortness of breath and has normal vital signs.  Labs obtained are reassuring.  She does have an elevated white blood cell count of 14,000 but this appears to be chronic for her.  Discussed importance of getting up regularly during the day while working from home.  Recommended compression stockings.  Discussed return precautions.  Patient is comfortable with plan will follow up with her primary care physician and/or cardiologist.  At this time, I do not feel there is any life-threatening condition present. I have reviewed, interpreted and discussed all results (EKG, imaging, lab, urine as appropriate) and exam findings with patient/family. I have reviewed nursing notes and appropriate previous records.  I feel the patient is safe to be discharged home without further emergent workup and can continue workup as an outpatient as needed. Discussed usual and customary return precautions. Patient/family verbalize understanding and are comfortable with this plan.  Outpatient follow-up has been provided as needed. All questions have been answered.   Ashani Pumphrey was  evaluated in Emergency Department on 07/12/2020 for the symptoms described in the history of present illness. She was evaluated in the context of the global COVID-19 pandemic, which necessitated consideration that the patient might be at risk for infection with the SARS-CoV-2 virus that causes COVID-19. Institutional protocols and algorithms that pertain to the evaluation of patients at risk for COVID-19 are in a state of rapid change based on information released by regulatory bodies including the CDC and federal and state organizations. These policies and algorithms were followed during the patient's care in the ED.      Haedyn Ancrum, Delice Bison, DO 07/13/20 0030

## 2020-07-12 NOTE — Telephone Encounter (Signed)
Left message for patient to call back  

## 2020-07-13 ENCOUNTER — Ambulatory Visit (HOSPITAL_BASED_OUTPATIENT_CLINIC_OR_DEPARTMENT_OTHER)
Admission: RE | Admit: 2020-07-13 | Discharge: 2020-07-13 | Disposition: A | Discharge status: Home / Self Care | Payer: 59 | Source: Ambulatory Visit | Attending: Emergency Medicine | Admitting: Emergency Medicine

## 2020-07-13 ENCOUNTER — Other Ambulatory Visit: Payer: Self-pay

## 2020-07-13 DIAGNOSIS — E669 Obesity, unspecified: Secondary | ICD-10-CM | POA: Insufficient documentation

## 2020-07-13 DIAGNOSIS — M79609 Pain in unspecified limb: Secondary | ICD-10-CM

## 2020-07-13 DIAGNOSIS — M7989 Other specified soft tissue disorders: Secondary | ICD-10-CM | POA: Diagnosis not present

## 2020-07-13 DIAGNOSIS — R609 Edema, unspecified: Secondary | ICD-10-CM | POA: Insufficient documentation

## 2020-08-07 ENCOUNTER — Telehealth (HOSPITAL_COMMUNITY): Payer: Self-pay | Admitting: Emergency Medicine

## 2020-08-07 NOTE — Telephone Encounter (Signed)
Attempted to call patient regarding upcoming cardiac CT appointment. °Left message on voicemail with name and callback number °Trissa Molina RN Navigator Cardiac Imaging °[facility] °[phone] Office °[phone] Cell ° °

## 2020-08-08 ENCOUNTER — Encounter (HOSPITAL_COMMUNITY): Payer: Self-pay

## 2020-08-08 ENCOUNTER — Encounter: Payer: 59 | Admitting: *Deleted

## 2020-08-08 ENCOUNTER — Other Ambulatory Visit: Payer: Self-pay

## 2020-08-08 ENCOUNTER — Ambulatory Visit (HOSPITAL_COMMUNITY)
Admission: RE | Admit: 2020-08-08 | Discharge: 2020-08-08 | Disposition: A | Discharge status: Home / Self Care | Payer: 59 | Source: Ambulatory Visit | Attending: Cardiology | Admitting: Cardiology

## 2020-08-08 ENCOUNTER — Telehealth: Payer: Self-pay | Admitting: Cardiology

## 2020-08-08 DIAGNOSIS — R072 Precordial pain: Secondary | ICD-10-CM | POA: Insufficient documentation

## 2020-08-08 DIAGNOSIS — Z006 Encounter for examination for normal comparison and control in clinical research program: Secondary | ICD-10-CM

## 2020-08-08 MED ORDER — IOHEXOL 350 MG/ML SOLN
80.0000 mL | Freq: Once | INTRAVENOUS | Status: AC | PRN
  Administered 2020-08-08: 80 mL via INTRAVENOUS

## 2020-08-08 MED ORDER — NITROGLYCERIN 0.4 MG SL SUBL
0.8000 mg | SUBLINGUAL_TABLET | Freq: Once | SUBLINGUAL | Status: AC
  Administered 2020-08-08: 0.8 mg via SUBLINGUAL

## 2020-08-08 MED ORDER — NITROGLYCERIN 0.4 MG SL SUBL
SUBLINGUAL_TABLET | SUBLINGUAL | Status: AC
  Filled 2020-08-08: qty 2

## 2020-08-08 NOTE — Telephone Encounter (Signed)
Patient contacted via MyChart

## 2020-08-08 NOTE — Telephone Encounter (Signed)
Patient is returning call to discuss CT results. 

## 2020-08-08 NOTE — Research (Signed)
Subject Name: Robin Lopez  Subject met inclusion and exclusion criteria.  The informed consent form, study requirements and expectations were reviewed with the subject and questions and concerns were addressed prior to the signing of the consent form.  The subject verbalized understanding of the trial requirements.  The subject agreed to participate in the IDENTIFY trial and signed the informed consent at 0756 on 08/08/20  The informed consent was obtained prior to performance of any protocol-specific procedures for the subject.  A copy of the signed informed consent was given to the subject and a copy was placed in the subject's medical record.   [name]

## 2020-08-15 ENCOUNTER — Encounter: Payer: Self-pay | Admitting: Family Medicine

## 2020-09-19 ENCOUNTER — Telehealth: Payer: 59 | Admitting: Nurse Practitioner

## 2020-09-19 DIAGNOSIS — R059 Cough, unspecified: Secondary | ICD-10-CM

## 2020-09-19 DIAGNOSIS — Z20822 Contact with and (suspected) exposure to covid-19: Secondary | ICD-10-CM

## 2020-09-19 MED ORDER — BENZONATATE 100 MG PO CAPS: 100.0000 mg | ORAL_CAPSULE | Freq: Three times a day (TID) | ORAL | 0 refills | Status: DC | PRN

## 2020-09-19 MED ORDER — NAPROXEN 500 MG PO TABS: 500.0000 mg | ORAL_TABLET | Freq: Two times a day (BID) | ORAL | 0 refills | Status: DC

## 2020-09-19 MED ORDER — ALBUTEROL SULFATE HFA 108 (90 BASE) MCG/ACT IN AERS: 2.0000 | INHALATION_SPRAY | Freq: Four times a day (QID) | RESPIRATORY_TRACT | 0 refills | Status: DC | PRN

## 2020-09-19 NOTE — Progress Notes (Signed)
E-Visit for Corona Virus Screening  Your current symptoms could be consistent with the coronavirus.  Many health care providers can now test patients at their office but not all are.  [facility] has multiple testing sites. For information on our COVID testing locations and hours go to [email or web address]  keepo your testing appointment, but here are some othe rresources.  Testing Information: The COVID-19 Community Testing sites are testing BY APPOINTMENT ONLY.  You can schedule online at [email or web address]  If you do not have access to a smart phone or computer you may call [phone] for an appointment.   Additional testing sites in the Community:  . For [facility] Testing sites in [facility]  FaceUpdate.[email or web address]  . For Pop-up testing sites in [place]  [email or web address].[email or web address]  . For [facility] [email or web address]  . For [facility] testing in [place] and [employer] [email or web address]  . For [facility] testing in [facility]   [email or web address]  For  more information about community testing call [phone]   Please quarantine yourself while awaiting your test results. Please stay home for a minimum of 10 days from the first day of illness with improving symptoms and you have had 24 hours of no fever (without the use of Tylenol (Acetaminophen) Motrin (Ibuprofen) or any fever reducing medication).  Also - Do not get tested prior to returning to work because once you have had a positive test the test can stay positive for more than a month in some cases.   You should wear a mask or cloth face covering over your nose and mouth if you must be  around other people or animals, including pets (even at home). Try to stay at least 6 feet away from other people. This will protect the people around you.  Please continue good preventive care measures, including:  frequent hand-washing, avoid touching your face, cover coughs/sneezes, stay out of crowds and keep a 6 foot distance from others.  COVID-19 is a respiratory illness with symptoms that are similar to the flu. Symptoms are typically mild to moderate, but there have been cases of severe illness and death due to the virus.   The following symptoms may appear 2-14 days after exposure: . Fever . Cough . Shortness of breath or difficulty breathing . Chills . Repeated shaking with chills . Muscle pain . Headache . Sore throat . New loss of taste or smell . Fatigue . Congestion or runny nose . Nausea or vomiting . Diarrhea  Go to the nearest hospital ED for assessment if fever/cough/breathlessness are severe or illness seems like a threat to life.  It is vitally important that if you feel that you have an infection such as this virus or any other virus that you stay home and away from places where you may spread it to others.  You should avoid contact with people age 78 and older.   You can use medication such as A prescription cough medication called Tessalon Perles 100 mg. You may take 1-2 capsules every 8 hours as needed for cough and A prescription anti-inflammatory called Naprosyn 500 mg. Take twice daily as needed for fever or body aches for 2 weeks  You may also take acetaminophen (Tylenol) as needed for fever.  Reduce your risk of any infection by using the same precautions used for avoiding the common cold or flu:  Marland Kitchen Wash your hands often with soap and warm water for at least 20 seconds.  If soap and water are not readily available,  use an alcohol-based hand sanitizer with at least 60% alcohol.  . If coughing or sneezing, cover your mouth and nose by coughing or sneezing into the  elbow areas of your shirt or coat, into a tissue or into your sleeve (not your hands). . Avoid shaking hands with others and consider head nods or verbal greetings only. . Avoid touching your eyes, nose, or mouth with unwashed hands.  . Avoid close contact with people who are sick. . Avoid places or events with large numbers of people in one location, like concerts or sporting events. . Carefully consider travel plans you have or are making. . If you are planning any travel outside or inside the [place], visit the CDC's Travelers' Health webpage for the latest health notices. . If you have some symptoms but not all symptoms, continue to monitor at home and seek medical attention if your symptoms worsen. . If you are having a medical emergency, call 911.  HOME CARE . Only take medications as instructed by your medical team. . Drink plenty of fluids and get plenty of rest. . A steam or ultrasonic humidifier can help if you have congestion.   GET HELP RIGHT AWAY IF YOU HAVE EMERGENCY WARNING SIGNS** FOR COVID-19. If you or someone is showing any of these signs seek emergency medical care immediately. Call 911 or proceed to your closest emergency facility if: . You develop worsening high fever. . Trouble breathing . Bluish lips or face . Persistent pain or pressure in the chest . New confusion . Inability to wake or stay awake . You cough up blood. . Your symptoms become more severe  **This list is not all possible symptoms. Contact your medical provider for any symptoms that are sever or concerning to you.  MAKE SURE YOU   Understand these instructions.  Will watch your condition.  Will get help right away if you are not doing well or get worse.  Your e-visit answers were reviewed by a board certified advanced clinical practitioner to complete your personal care plan.  Depending on the condition, your plan could have included both over the counter or prescription medications.  If there is  a problem please reply once you have received a response from your provider.  Your safety is important to [place].  If you have drug allergies check your prescription carefully.    You can use MyChart to ask questions about today's visit, request a non-urgent call back, or ask for a work or school excuse for 24 hours related to this e-Visit. If it has been greater than 24 hours you will need to follow up with your provider, or enter a new e-Visit to address those concerns. You will get an e-mail in the next two days asking about your experience.  I hope that your e-visit has been valuable and will speed your recovery. Thank you for using e-visits.  5-10 minutes spent reviewing and documenting in chart.

## 2020-09-26 ENCOUNTER — Encounter: Payer: Self-pay | Admitting: Family Medicine

## 2020-10-03 ENCOUNTER — Encounter: Payer: Self-pay | Admitting: Family Medicine

## 2020-10-04 ENCOUNTER — Telehealth (INDEPENDENT_AMBULATORY_CARE_PROVIDER_SITE_OTHER): Payer: 59 | Admitting: Family Medicine

## 2020-10-04 DIAGNOSIS — U071 COVID-19: Secondary | ICD-10-CM | POA: Diagnosis not present

## 2020-10-04 DIAGNOSIS — T50Z95A Adverse effect of other vaccines and biological substances, initial encounter: Secondary | ICD-10-CM | POA: Diagnosis not present

## 2020-10-04 NOTE — Progress Notes (Signed)
Patient ID: Robin Lopez, female   DOB: March 24, 1981, 40 y.o.   MRN: [id number]  This visit type was conducted due to national recommendations for restrictions regarding the COVID-19 pandemic in an effort to limit this patient's exposure and mitigate transmission in our community.   Virtual Visit via Video Note  I connected with Robin Lopez on 10/04/20 at  4:45 PM EST by a video enabled telemedicine application and verified that I am speaking with the correct person using two identifiers.  Location patient: home Location provider:work or home office Persons participating in the virtual visit: patient, provider  I discussed the limitations of evaluation and management by telemedicine and the availability of in person appointments. The patient expressed understanding and agreed to proceed.   HPI: Robin Lopez had recent COVID-19 infection.  She had onset of symptoms January 16.  Basically, her whole household tested positive for Covid.  Most of the other household members had milder case.  She had more profound symptoms.  Her PCR test came back positive on the 20th.  She had headaches, body aches, fatigue, cough, some dyspnea.  Overall some improved with no fever and she went Monday for her second vaccine.  This was around 10 AM.  By yesterday she had developed fever along with some redness and swelling of her left upper arm at the site of the vaccine.  She got her vaccine at a local pharmacy.  Her fever has resolved today.  Swelling is no worse.  Her main residual symptoms for the Covid are fatigue and some cough and mild dyspnea.  Not monitoring pulse oximeter.  No respiratory distress.  Denies any nausea, vomiting, or diarrhea.   ROS: See pertinent positives and negatives per HPI.  No past medical history on file.  Past Surgical History:  Procedure Laterality Date  . CESAREAN SECTION     x 2  . CHOLECYSTECTOMY    . TUBAL LIGATION    . WISDOM TOOTH EXTRACTION      Family History   Problem Relation Age of Onset  . Hypertension Father   . CAD Father   . Deep vein thrombosis Brother     SOCIAL HX: Non-smoker   Current Outpatient Medications:  .  albuterol (VENTOLIN HFA) 108 (90 Base) MCG/ACT inhaler, Inhale 2 puffs into the lungs every 6 (six) hours as needed for wheezing or shortness of breath., Disp: 8 g, Rfl: 0 .  benzonatate (TESSALON PERLES) 100 MG capsule, Take 1 capsule (100 mg total) by mouth 3 (three) times daily as needed., Disp: 20 capsule, Rfl: 0 .  metoprolol tartrate (LOPRESSOR) 100 MG tablet, Take one tablet by mouth 2 hours prior to CT, Disp: 1 tablet, Rfl: 0 .  naproxen (NAPROSYN) 500 MG tablet, Take 1 tablet (500 mg total) by mouth 2 (two) times daily with a meal., Disp: 30 tablet, Rfl: 0  EXAM:  VITALS per patient if applicable:  GENERAL: alert, oriented, appears well and in no acute distress  HEENT: atraumatic, conjunttiva clear, no obvious abnormalities on inspection of external nose and ears  NECK: normal movements of the head and neck  LUNGS: on inspection no signs of respiratory distress, breathing rate appears normal, no obvious gross SOB, gasping or wheezing  CV: no obvious cyanosis  MS: moves all visible extremities without noticeable abnormality  PSYCH/NEURO: pleasant and cooperative, no obvious depression or anxiety, speech and thought processing grossly intact  ASSESSMENT AND PLAN:  Discussed the following assessment and plan:  COVID-19 virus infection  Adverse effect  of vaccine, initial encounter  Patient had recent COVID-19 infection and slowly improving symptomatically.  She still has some residual fatigue.  We suggested pulse oximeter to monitor oxygen levels.  She has what sounds like local reaction to second Covid vaccine on Monday.  No anaphylaxis. Watch for any progressive swelling, redness, or any recurrent fever.   I discussed the assessment and treatment plan with the patient. The patient was provided an  opportunity to ask questions and all were answered. The patient agreed with the plan and demonstrated an understanding of the instructions.   The patient was advised to call back or seek an in-person evaluation if the symptoms worsen or if the condition fails to improve as anticipated.     Carolann Littler, MD

## 2020-12-08 IMAGING — US ARTERIAL AND VENOUS ULTRASOUND OF THE ABDOMEN PELVIS AND SCROTUM
1 series · 14 of 25 positions shown · non-contrast
Comparison: CT from 02/26/2013

CLINICAL DATA: Pelvic pain and left lower quadrant tenderness

EXAM:
TRANSABDOMINAL AND TRANSVAGINAL ULTRASOUND OF PELVIS
DOPPLER ULTRASOUND OF OVARIES
TECHNIQUE: Both transabdominal and transvaginal ultrasound examinations of the
pelvis were performed. Transabdominal technique was performed for
global imaging of the pelvis including uterus, ovaries, adnexal
regions, and pelvic cul-de-sac.
It was necessary to proceed with endovaginal exam following the
transabdominal exam to visualize the ovaries. Color and duplex
Doppler ultrasound was utilized to evaluate blood flow to the
ovaries.

[Series 1: arterial and venous ultrasound of the abdomen pelv · 0.23mm/px · 64 acquisitions, 14 frames shown]
[im 1/64]
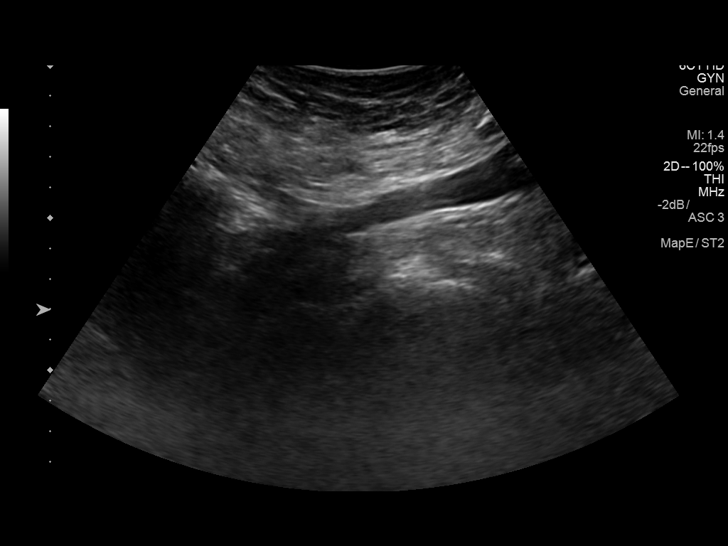
[im 6/64]
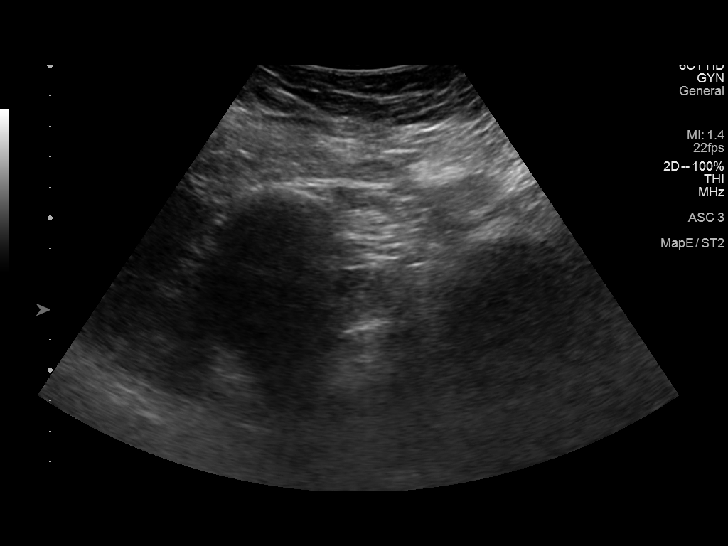
[im 11/64]
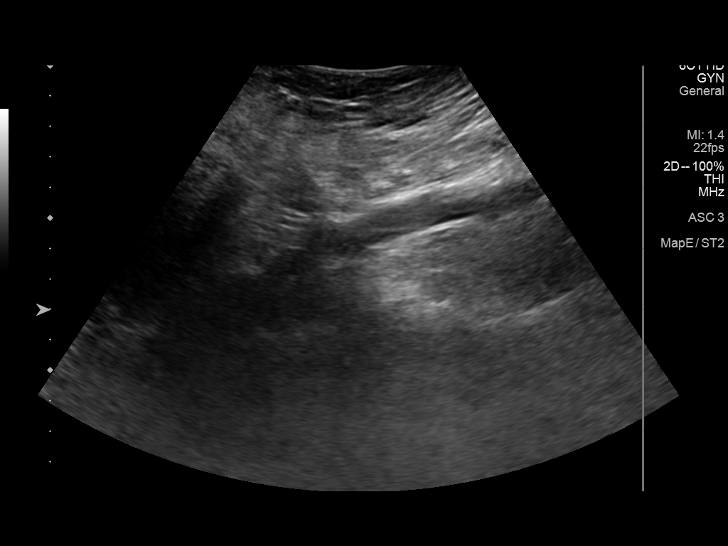
[im 16/64]
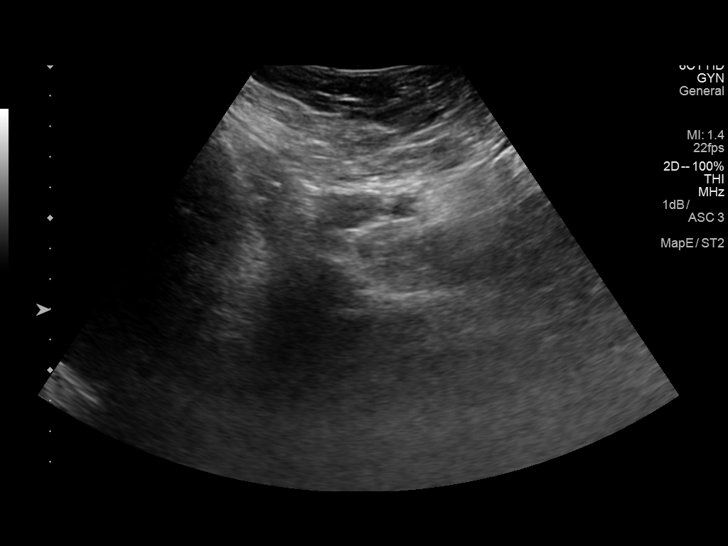
[im 22/64]
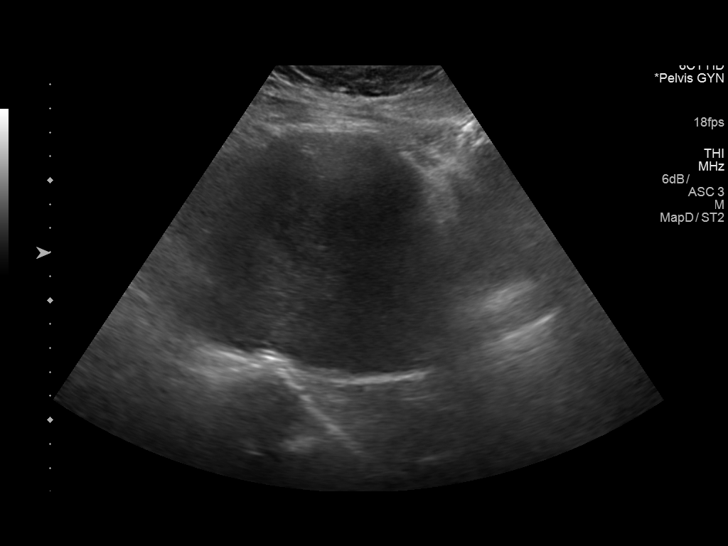
[im 24/64]
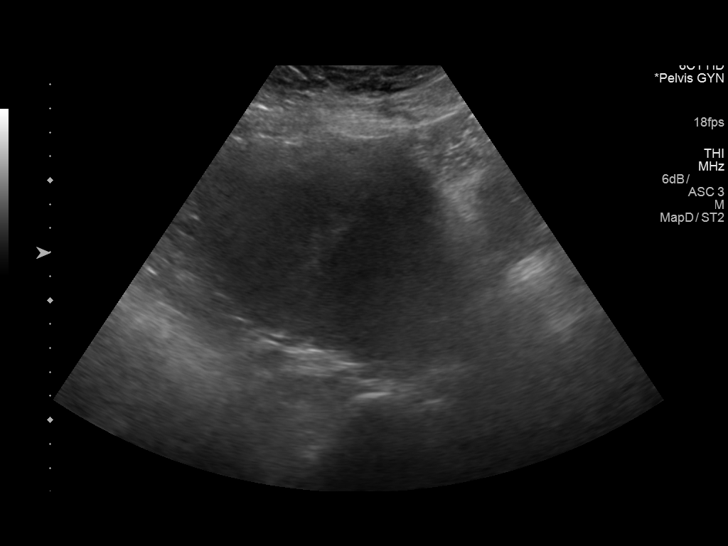
[im 29/64]
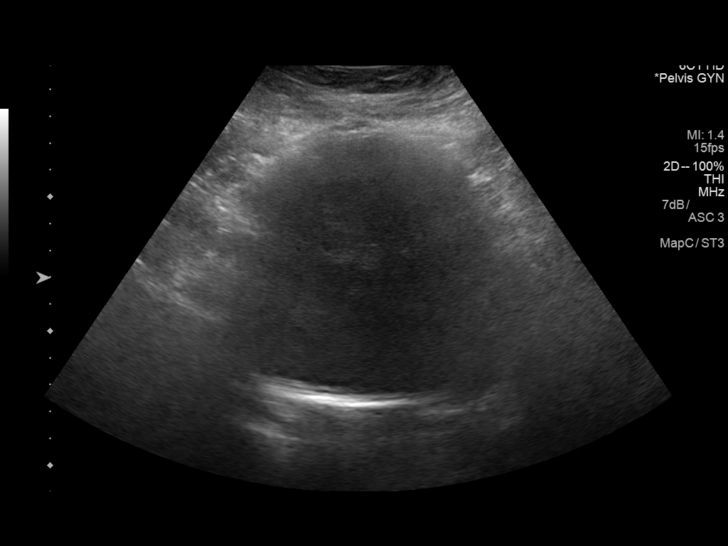
[im 35/64]
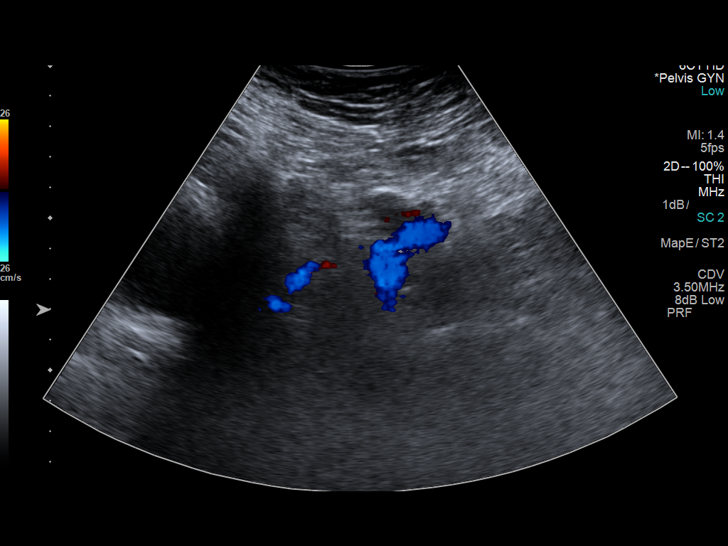
[im 40/64]
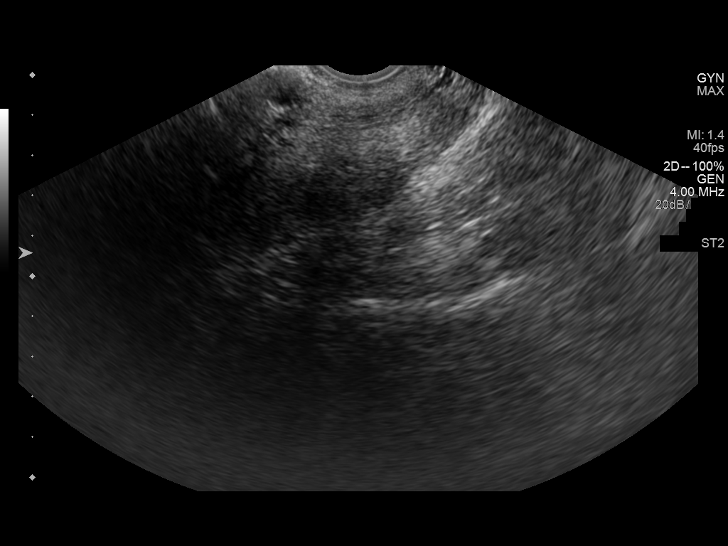
[im 43/64]
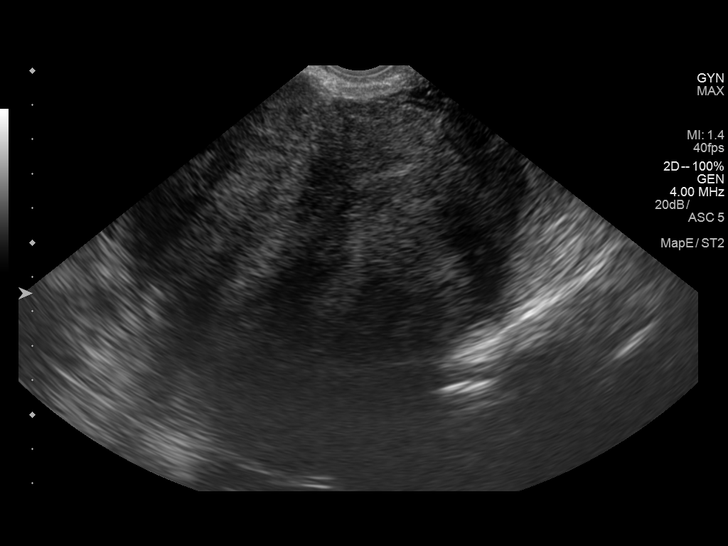
[im 48/64]
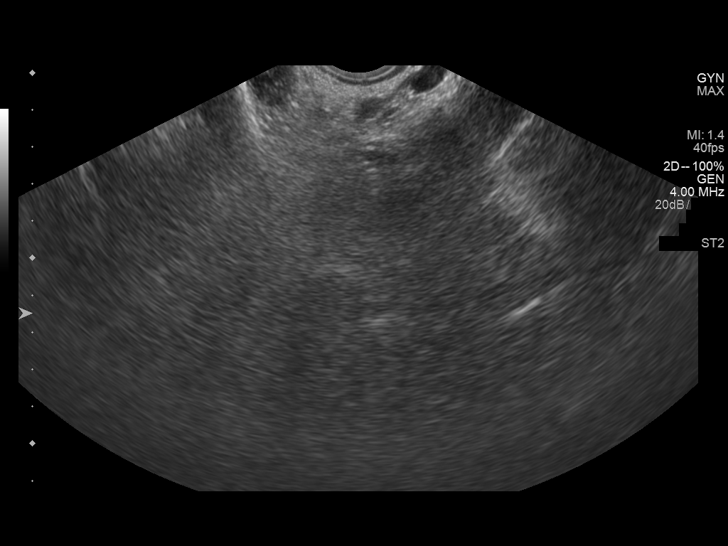
[im 53/64]
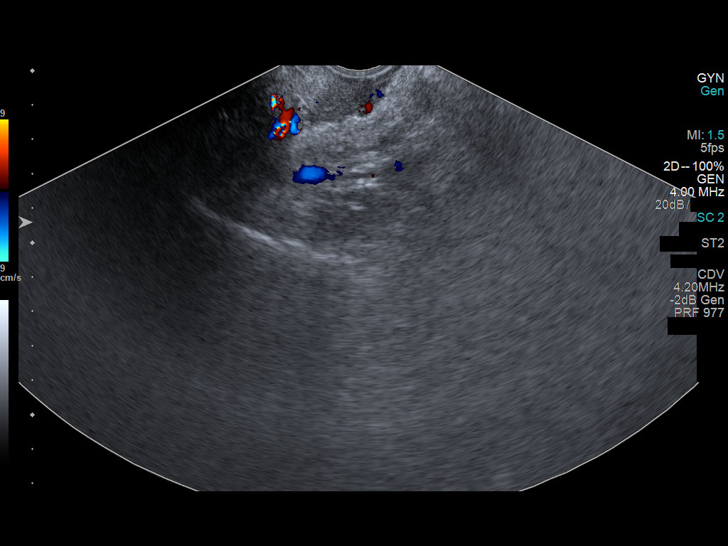
[im 58/64]
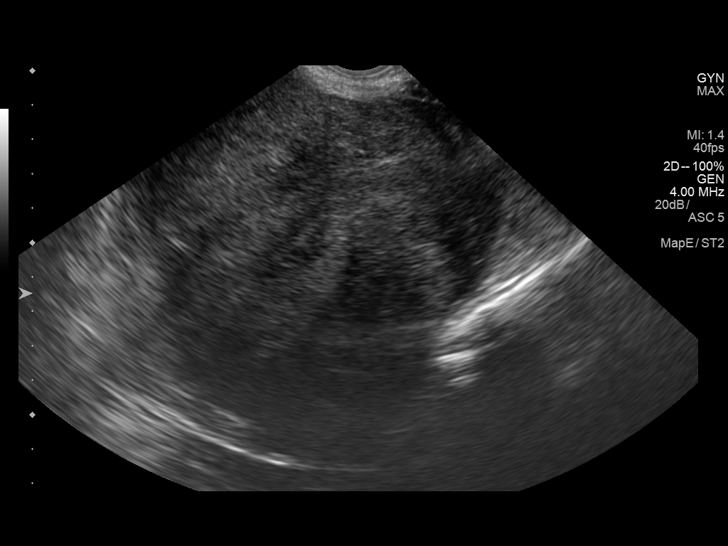
[im 64/64]
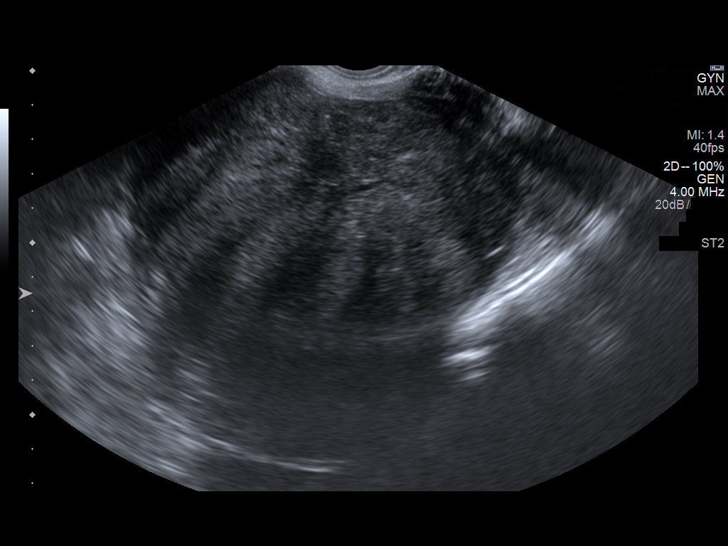

[14 of 25 positions shown; findings below may reference images not displayed]

FINDINGS: Uterus

Measurements: 12.5 x 9.7 x 12.1 cm. = volume: 770 mL. Fibroid
changes noted. The largest of these lies posteriorly on the left
measuring approximately 8 cm in greatest dimension.

Endometrium

Thickness: 16 mm.  No focal abnormality visualized.

Right ovary

Not visualized

Left ovary

Not visualized

Other findings

No abnormal free fluid.
IMPRESSION: Fibroid uterus.

Nonvisualization of the ovaries.

## 2021-01-12 ENCOUNTER — Telehealth: Payer: Self-pay

## 2021-01-12 DIAGNOSIS — Z006 Encounter for examination for normal comparison and control in clinical research program: Secondary | ICD-10-CM

## 2021-01-12 NOTE — Telephone Encounter (Signed)
I have attempted without success to contact this patient by phone for her Identify 90 day follow up phone call. Patients voicemail was full. An e-mail was also sent to patient.

## 2021-01-30 ENCOUNTER — Telehealth: Payer: Self-pay

## 2021-01-30 DIAGNOSIS — Z006 Encounter for examination for normal comparison and control in clinical research program: Secondary | ICD-10-CM

## 2021-01-30 NOTE — Telephone Encounter (Signed)
I have attempted without success to contact this patient by phone for her Identify 90 day follow up phone call. An e-mail was also sent to patient.

## 2021-04-16 ENCOUNTER — Emergency Department (HOSPITAL_COMMUNITY)
Admission: EM | Admit: 2021-04-16 | Discharge: 2021-04-16 | Disposition: A | Discharge status: Home / Self Care | Payer: 59 | Attending: Emergency Medicine | Admitting: Emergency Medicine

## 2021-04-16 ENCOUNTER — Other Ambulatory Visit: Payer: Self-pay

## 2021-04-16 ENCOUNTER — Emergency Department (HOSPITAL_COMMUNITY): Payer: 59

## 2021-04-16 DIAGNOSIS — R109 Unspecified abdominal pain: Secondary | ICD-10-CM

## 2021-04-16 DIAGNOSIS — R1030 Lower abdominal pain, unspecified: Secondary | ICD-10-CM | POA: Diagnosis present

## 2021-04-16 DIAGNOSIS — D259 Leiomyoma of uterus, unspecified: Secondary | ICD-10-CM | POA: Diagnosis not present

## 2021-04-16 DIAGNOSIS — D219 Benign neoplasm of connective and other soft tissue, unspecified: Secondary | ICD-10-CM

## 2021-04-16 LAB — URINALYSIS, ROUTINE W REFLEX MICROSCOPIC
Bilirubin Urine: NEGATIVE
Glucose, UA: NEGATIVE mg/dL
Hgb urine dipstick: NEGATIVE
Ketones, ur: NEGATIVE mg/dL
Leukocytes,Ua: NEGATIVE
Nitrite: NEGATIVE
Protein, ur: NEGATIVE mg/dL
Specific Gravity, Urine: 1.014 (ref 1.005–1.030)
pH: 5 (ref 5.0–8.0)

## 2021-04-16 LAB — CBC WITH DIFFERENTIAL/PLATELET
Abs Immature Granulocytes: 0.13 10*3/uL — ABNORMAL HIGH (ref 0.00–0.07)
Basophils Absolute: 0.1 10*3/uL (ref 0.0–0.1)
Basophils Relative: 1 %
Eosinophils Absolute: 0.2 10*3/uL (ref 0.0–0.5)
Eosinophils Relative: 2 %
HCT: 40.4 % (ref 36.0–46.0)
Hemoglobin: 13.3 g/dL (ref 12.0–15.0)
Immature Granulocytes: 1 %
Lymphocytes Relative: 15 %
Lymphs Abs: 1.9 10*3/uL (ref 0.7–4.0)
MCH: 28.1 pg (ref 26.0–34.0)
MCHC: 32.9 g/dL (ref 30.0–36.0)
MCV: 85.4 fL (ref 80.0–100.0)
Monocytes Absolute: 1 10*3/uL (ref 0.1–1.0)
Monocytes Relative: 8 %
Neutro Abs: 9.4 10*3/uL — ABNORMAL HIGH (ref 1.7–7.7)
Neutrophils Relative %: 73 %
Platelets: 302 10*3/uL (ref 150–400)
RBC: 4.73 MIL/uL (ref 3.87–5.11)
RDW: 14 % (ref 11.5–15.5)
WBC: 12.7 10*3/uL — ABNORMAL HIGH (ref 4.0–10.5)
nRBC: 0 % (ref 0.0–0.2)

## 2021-04-16 LAB — COMPREHENSIVE METABOLIC PANEL
ALT: 13 U/L (ref 0–44)
AST: 16 U/L (ref 15–41)
Albumin: 3.5 g/dL (ref 3.5–5.0)
Alkaline Phosphatase: 114 U/L (ref 38–126)
Anion gap: 7 (ref 5–15)
BUN: 11 mg/dL (ref 6–20)
CO2: 25 mmol/L (ref 22–32)
Calcium: 8.8 mg/dL — ABNORMAL LOW (ref 8.9–10.3)
Chloride: 104 mmol/L (ref 98–111)
Creatinine, Ser: 0.7 mg/dL (ref 0.44–1.00)
GFR, Estimated: >60 mL/min (ref >60)
Glucose, Bld: 101 mg/dL — ABNORMAL HIGH (ref 70–99)
Potassium: 4.2 mmol/L (ref 3.5–5.1)
Sodium: 136 mmol/L (ref 135–145)
Total Bilirubin: 0.6 mg/dL (ref 0.3–1.2)
Total Protein: 6.6 g/dL (ref 6.5–8.1)

## 2021-04-16 LAB — LIPASE, BLOOD: Lipase: 37 U/L (ref 11–51)

## 2021-04-16 LAB — I-STAT BETA HCG BLOOD, ED (MC, WL, AP ONLY): I-stat hCG, quantitative: <5 m[IU]/mL (ref <5)

## 2021-04-16 MED ORDER — IOHEXOL 300 MG/ML  SOLN
100.0000 mL | Freq: Once | INTRAMUSCULAR | Status: AC | PRN
  Administered 2021-04-16: 100 mL via INTRAVENOUS

## 2021-04-16 NOTE — ED Provider Notes (Signed)
[facility] EMERGENCY DEPARTMENT Provider Note   CSN: [id number] Arrival date & time: 04/16/21  1231     History Chief Complaint  Patient presents with   Abdominal Pain    Robin Lopez is a 40 y.o. female.  Patient presents chief complaint abdominal pain.  Describes it as initially left-sided and then the central mid lower abdominal region.  Describes an aching pain worse when she pushes on her belly a certain way.  No prior episodes of similar pain.  Denies fevers or cough.  No vomiting or diarrhea.      No past medical history on file.  There are no problems to display for this patient.   Past Surgical History:  Procedure Laterality Date   CESAREAN SECTION     x 2   CHOLECYSTECTOMY     TUBAL LIGATION     WISDOM TOOTH EXTRACTION       OB History     Gravida  3   Para  2   Term  2   Preterm      AB      Living  2      SAB      IAB      Ectopic      Multiple      Live Births              Family History  Problem Relation Age of Onset   Hypertension Father    CAD Father    Deep vein thrombosis Brother     Social History   Tobacco Use   Smoking status: Never   Smokeless tobacco: Never  Substance Use Topics   Alcohol use: No   Drug use: No    Home Medications Prior to Admission medications   Medication Sig Start Date End Date Taking? Authorizing Provider  albuterol (VENTOLIN HFA) 108 (90 Base) MCG/ACT inhaler Inhale 2 puffs into the lungs every 6 (six) hours as needed for wheezing or shortness of breath. 09/19/20   Hassell Done, Mary-Margaret, FNP  benzonatate (TESSALON PERLES) 100 MG capsule Take 1 capsule (100 mg total) by mouth 3 (three) times daily as needed. 09/19/20   Chevis Pretty, FNP  metoprolol tartrate (LOPRESSOR) 100 MG tablet Take one tablet by mouth 2 hours prior to CT 07/06/20   Sueanne Margarita, MD  naproxen (NAPROSYN) 500 MG tablet Take 1 tablet (500 mg total) by mouth 2 (two) times daily with a  meal. 09/19/20   Chevis Pretty, FNP    Allergies    Prednisone  Review of Systems   Review of Systems  Constitutional:  Negative for fever.  HENT:  Negative for ear pain.   Eyes:  Negative for pain.  Respiratory:  Negative for cough.   Cardiovascular:  Negative for chest pain.  Gastrointestinal:  Positive for abdominal pain.  Genitourinary:  Negative for flank pain.  Musculoskeletal:  Negative for back pain.  Skin:  Negative for rash.  Neurological:  Negative for headaches.   Physical Exam Updated Vital Signs BP (!) 129/95 (BP Location: Right Arm)   Pulse 82   Temp 98.7 F (37.1 C) (Oral)   Resp 18   SpO2 100%   Physical Exam Constitutional:      General: She is not in acute distress.    Appearance: Normal appearance.  HENT:     Head: Normocephalic.     Nose: Nose normal.  Eyes:     Extraocular Movements: Extraocular movements intact.  Cardiovascular:  Rate and Rhythm: Normal rate.  Pulmonary:     Effort: Pulmonary effort is normal.  Abdominal:     Tenderness: There is abdominal tenderness.     Comments: Mild tenderness in the mid lower abdominal region.  No guarding or rebound.  Musculoskeletal:        General: Normal range of motion.     Cervical back: Normal range of motion.  Neurological:     General: No focal deficit present.     Mental Status: She is alert. Mental status is at baseline.    ED Results / Procedures / Treatments   Labs (all labs ordered are listed, but only abnormal results are displayed) Labs Reviewed  CBC WITH DIFFERENTIAL/PLATELET - Abnormal; Notable for the following components:      Result Value   WBC 12.7 (*)    Neutro Abs 9.4 (*)    Abs Immature Granulocytes 0.13 (*)    All other components within normal limits  COMPREHENSIVE METABOLIC PANEL - Abnormal; Notable for the following components:   Glucose, Bld 101 (*)    Calcium 8.8 (*)    All other components within normal limits  LIPASE, BLOOD  URINALYSIS, ROUTINE W  REFLEX MICROSCOPIC  I-STAT BETA HCG BLOOD, ED ([facility], [facility], [facility] ONLY)    EKG None  Radiology CT ABDOMEN PELVIS W CONTRAST  Result Date: 04/16/2021 CLINICAL DATA:  Abdominal pain.  Fever. EXAM: CT ABDOMEN AND PELVIS WITH CONTRAST TECHNIQUE: Multidetector CT imaging of the abdomen and pelvis was performed using the standard protocol following bolus administration of intravenous contrast. CONTRAST:  165m OMNIPAQUE IOHEXOL 300 MG/ML  SOLN COMPARISON:  CT abdomen pelvis, 03/28/2013. FINDINGS: Lower chest: No acute abnormality. Hepatobiliary: No focal liver abnormality is seen. Status post cholecystectomy. No biliary dilatation. Pancreas: Unremarkable. No pancreatic ductal dilatation or surrounding inflammatory changes. Spleen: Normal in size without focal abnormality. Adrenals/Urinary Tract: Adrenal glands are unremarkable. Small, exophytic left renal cyst. The kidneys are otherwise normal, without renal calculi, or hydronephrosis. Mass effect on urinary bladder. Stomach/Bowel: Stomach is within normal limits. Appendix appears normal. No evidence of bowel wall thickening, distention, or inflammatory changes. Vascular/Lymphatic: No significant vascular findings are present. No enlarged abdominal or pelvic lymph nodes. Reproductive: Markedly-enlarged fibroid uterus, measuring approximately 11.0 x 17.0 x 16.5 cm (AP by trans axial by cc). For comparison CT from 2014, the fibroid measured up to 5.4 cm. Demonstrable mass effect on urinary bladder, and distal ureters. Other: No abdominal wall hernia or abnormality. No abdominopelvic ascites. Musculoskeletal: Left perineal sebaceous cyst. No acute or significant osseous findings. IMPRESSION: 1. No acute abdominopelvic process. 2. Markedly-enlarged fibroid uterus, measuring up to 17 cm in both transaxial and craniocaudal dimensions. Notable mass effect on the urinary bladder and distal ureters, without discrete hydronephrosis. This may contribute to the patient's abdominal  discomfort. Consider nonemergent, outpatient referral for further evaluation. Electronically Signed   By: JMichaelle BirksM.D.   On: 04/16/2021 17:07    Procedures Procedures   Medications Ordered in ED Medications  iohexol (OMNIPAQUE) 300 MG/ML solution 100 mL (100 mLs Intravenous Contrast Given 04/16/21 1634)    ED Course  I have reviewed the triage vital signs and the nursing notes.  Pertinent labs & imaging results that were available during my care of the patient were reviewed by me and considered in my medical decision making (see chart for details).    MDM Rules/Calculators/A&P  Labs show white count of 12.7 chemistry otherwise unremarkable.  Urinalysis shows no evidence of UTI.  CT abdomen pelvis pursued consistent with multiple fibroids in the uterus.  This is likely the cause of the patient's pain.  Will recommend outpatient follow-up with OB/GYN.  Patient states she has an OB/GYN doctor with whom she can follow-up with advised her to call her doctor this week.  Advised immediate return for worsening pain fevers or additional concerns.  Final Clinical Impression(s) / ED Diagnoses Final diagnoses:  Abdominal pain, unspecified abdominal location  Fibroids    Rx / DC Orders ED Discharge Orders     None        Luna Fuse, MD 04/16/21 [phone]

## 2021-04-16 NOTE — ED Triage Notes (Signed)
Pt reports LLQ abdominal pain, now has moved to mid abdomen. Pain started intermittently x 1 week. Pain worse last night with diaphoresis. Pain worse after eating.

## 2021-04-16 NOTE — ED Provider Notes (Signed)
Emergency Medicine Provider Triage Evaluation Note  Robin Lopez , a 40 y.o. female  was evaluated in triage.  Pt complains of abd that started in the llq and is now periumbilical.  Review of Systems  Positive: Abd pain Negative: nv  Physical Exam  BP (!) 163/110 (BP Location: Left Arm)   Pulse (!) 104   Temp 98.7 F (37.1 C) (Oral)   Resp 20   SpO2 100%  Gen:   Awake, no distress   Resp:  Normal effort  MSK:   Moves extremities without difficulty  Other:  tearful  Medical Decision Making  Medically screening exam initiated at 1:07 PM.  Appropriate orders placed.  Robin Lopez was informed that the remainder of the evaluation will be completed by another provider, this initial triage assessment does not replace that evaluation, and the importance of remaining in the ED until their evaluation is complete.     Bishop Dublin 04/16/21 1308    Luna Fuse, MD 04/16/21 1906

## 2021-04-16 NOTE — Discharge Instructions (Addendum)
Call your primary care doctor or specialist as discussed in the next 2-3 days.   Return immediately back to the ER if:  Your symptoms worsen within the next 12-24 hours. You develop new symptoms such as new fevers, persistent vomiting, new pain, shortness of breath, or new weakness or numbness, or if you have any other concerns.  

## 2021-04-17 ENCOUNTER — Telehealth: Payer: Self-pay

## 2021-04-17 NOTE — Telephone Encounter (Signed)
Nothing further needed 

## 2021-04-17 NOTE — Telephone Encounter (Signed)
Patient called the after hours line and stated she has been having abdominal pain for about 1 week. Started on her left side and noe moved to her belly button. She states that she ate and it started hurting again.    Looks like the patient went to the ED for this.

## 2021-04-27 ENCOUNTER — Encounter: Payer: Self-pay | Admitting: Family Medicine

## 2021-04-27 ENCOUNTER — Other Ambulatory Visit: Payer: Self-pay

## 2021-04-27 ENCOUNTER — Telehealth (INDEPENDENT_AMBULATORY_CARE_PROVIDER_SITE_OTHER): Payer: 59 | Admitting: Family Medicine

## 2021-04-27 VITALS — Temp 101.0°F

## 2021-04-27 DIAGNOSIS — U071 COVID-19: Secondary | ICD-10-CM | POA: Diagnosis not present

## 2021-04-27 MED ORDER — DOXYCYCLINE HYCLATE 100 MG PO CAPS: 100.0000 mg | ORAL_CAPSULE | Freq: Two times a day (BID) | ORAL | 0 refills | Status: DC

## 2021-04-27 NOTE — Progress Notes (Signed)
Patient ID: Robin Lopez, female   DOB: Sep 18, 1980, 40 y.o.   MRN: [id number]  This visit type was conducted due to national recommendations for restrictions regarding the COVID-19 pandemic in an effort to limit this patient's exposure and mitigate transmission in our community.   Virtual Visit via Video Note  I connected with Robin Lopez on 04/27/21 at 10:00 AM EDT by a video enabled telemedicine application and verified that I am speaking with the correct person using two identifiers.  Location patient: home Location provider:work or home office Persons participating in the virtual visit: patient, provider  I discussed the limitations of evaluation and management by telemedicine and the availability of in person appointments. The patient expressed understanding and agreed to proceed.   HPI:  Robin Lopez has COVID-19 infection.  She states that about 8 days ago on Thursday she developed some nasal congestion, body aches, fever, sore throat.  Friday she went to an urgent care and had rapid COVID test which came back negative.  She had PCR sent as well and that came back positive by Monday.  Patient states her nasal congestion and sore throat have improved but she still has some fever.  Actually, her fever gone away until yesterday after a couple days no fever.  No significant dyspnea.  Still has some fatigue issues.   ROS: See pertinent positives and negatives per HPI.  No past medical history on file.  Past Surgical History:  Procedure Laterality Date   CESAREAN SECTION     x 2   CHOLECYSTECTOMY     TUBAL LIGATION     WISDOM TOOTH EXTRACTION      Family History  Problem Relation Age of Onset   Hypertension Father    CAD Father    Deep vein thrombosis Brother     SOCIAL HX: Non-smoker   Current Outpatient Medications:    doxycycline (VIBRAMYCIN) 100 MG capsule, Take 1 capsule (100 mg total) by mouth 2 (two) times daily., Disp: 14 capsule, Rfl: 0   albuterol (VENTOLIN  HFA) 108 (90 Base) MCG/ACT inhaler, Inhale 2 puffs into the lungs every 6 (six) hours as needed for wheezing or shortness of breath. (Patient not taking: Reported on 04/27/2021), Disp: 8 g, Rfl: 0   benzonatate (TESSALON PERLES) 100 MG capsule, Take 1 capsule (100 mg total) by mouth 3 (three) times daily as needed. (Patient not taking: Reported on 04/27/2021), Disp: 20 capsule, Rfl: 0   metoprolol tartrate (LOPRESSOR) 100 MG tablet, Take one tablet by mouth 2 hours prior to CT (Patient not taking: Reported on 04/27/2021), Disp: 1 tablet, Rfl: 0   naproxen (NAPROSYN) 500 MG tablet, Take 1 tablet (500 mg total) by mouth 2 (two) times daily with a meal. (Patient not taking: Reported on 04/27/2021), Disp: 30 tablet, Rfl: 0  EXAM:  VITALS per patient if applicable:  GENERAL: alert, oriented, appears well and in no acute distress  HEENT: atraumatic, conjunttiva clear, no obvious abnormalities on inspection of external nose and ears  NECK: normal movements of the head and neck  LUNGS: on inspection no signs of respiratory distress, breathing rate appears normal, no obvious gross SOB, gasping or wheezing  CV: no obvious cyanosis  MS: moves all visible extremities without noticeable abnormality  PSYCH/NEURO: pleasant and cooperative, no obvious depression or anxiety, speech and thought processing grossly intact  ASSESSMENT AND PLAN:  Discussed the following assessment and plan:  COVID-19 infection.  Patient is in no respiratory distress.  She is nontoxic in appearance by virtual  visit.  However, she had some mild improvement and then worsening with recurrent fever which does raise concern of possible secondary infection.  -She is outside window for antiviral therapy. -We discussed starting doxycycline 100 mg twice daily for 7 days-given second wave of fever, as above -Recommend chest x-ray and further evaluation if not improved by Monday and follow-up sooner for any dyspnea or other concerns.      I discussed the assessment and treatment plan with the patient. The patient was provided an opportunity to ask questions and all were answered. The patient agreed with the plan and demonstrated an understanding of the instructions.   The patient was advised to call back or seek an in-person evaluation if the symptoms worsen or if the condition fails to improve as anticipated.     Carolann Littler, MD

## 2021-04-30 ENCOUNTER — Telehealth: Payer: Self-pay

## 2021-04-30 ENCOUNTER — Encounter: Payer: Self-pay | Admitting: Family Medicine

## 2021-04-30 NOTE — Telephone Encounter (Signed)
Generated return to work note on 05/03/21, as requested.   Lvm for patient informing can print out, also call office with any questions

## 2021-04-30 NOTE — Telephone Encounter (Signed)
Last video visit- 04/27/21

## 2021-05-11 ENCOUNTER — Emergency Department (HOSPITAL_COMMUNITY): Payer: 59

## 2021-05-11 ENCOUNTER — Other Ambulatory Visit: Payer: Self-pay

## 2021-05-11 ENCOUNTER — Emergency Department (HOSPITAL_COMMUNITY)
Admission: EM | Admit: 2021-05-11 | Discharge: 2021-05-12 | Disposition: A | Discharge status: Home / Self Care | Payer: 59 | Attending: Emergency Medicine | Admitting: Emergency Medicine

## 2021-05-11 ENCOUNTER — Encounter (HOSPITAL_COMMUNITY): Payer: Self-pay | Admitting: *Deleted

## 2021-05-11 DIAGNOSIS — I82442 Acute embolism and thrombosis of left tibial vein: Secondary | ICD-10-CM | POA: Diagnosis not present

## 2021-05-11 DIAGNOSIS — I82402 Acute embolism and thrombosis of unspecified deep veins of left lower extremity: Secondary | ICD-10-CM

## 2021-05-11 DIAGNOSIS — M7989 Other specified soft tissue disorders: Secondary | ICD-10-CM | POA: Diagnosis present

## 2021-05-11 NOTE — ED Triage Notes (Signed)
Pt started birth control 2 weeks ago, has been having pain in left lower leg for the past week.Reports sitting at a desk. Pt also reports having covid within the month. Recently tested negative for covid on 8/31; c/o dry cough

## 2021-05-11 NOTE — ED Provider Notes (Signed)
Emergency Medicine Provider Triage Evaluation Note  Robin Lopez , a 40 y.o. female  was evaluated in triage.  Pt complains of pain and swelling of the left lower leg and calf.  Patient reports 2 weeks ago she started a new oral contraceptive.  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  Patient reports over the last several days she has noticed some difficulty breathing especially when lying flat.  No swelling of the right leg.  No history of DVT/PE.  Patient is not a smoker.  Patient reports she recently had COVID and subsequently COVID-pneumonia but all of her symptoms had resolved including her cough and shortness of breath.  This returned several days ago.  Review of Systems  Positive: Left leg swelling, shortness of breath, cough Negative: Fever, chills  Physical Exam  BP (!) 163/91 (BP Location: Left Arm)   Pulse 75   Temp 98.9 F (37.2 C) (Oral)   Resp (!) 22   SpO2 100%  Gen:   Awake, no distress   Resp:  Normal effort, clear and equal breath sounds MSK:   Moves extremities without difficulty, swelling of the left lower extremity with mild pitting edema, tenderness to the calf Other:  No Tachycardia  Medical Decision Making  Medically screening exam initiated at 11:40 PM.  Appropriate orders placed.  Gizelle Zigman was informed that the remainder of the evaluation will be completed by another provider, this initial triage assessment does not replace that evaluation, and the importance of remaining in the ED until their evaluation is complete.  Concern for DVT/PE.  Labs and CT scan pending.   Ruger Saxer, Gwenlyn Perking 05/11/21 2342    Mesner, Corene Cornea, MD 05/12/21 0330

## 2021-05-12 ENCOUNTER — Emergency Department (HOSPITAL_COMMUNITY): Payer: 59

## 2021-05-12 ENCOUNTER — Emergency Department (HOSPITAL_BASED_OUTPATIENT_CLINIC_OR_DEPARTMENT_OTHER): Payer: 59

## 2021-05-12 ENCOUNTER — Other Ambulatory Visit: Payer: Self-pay

## 2021-05-12 DIAGNOSIS — M79605 Pain in left leg: Secondary | ICD-10-CM | POA: Diagnosis not present

## 2021-05-12 DIAGNOSIS — M7989 Other specified soft tissue disorders: Secondary | ICD-10-CM | POA: Diagnosis not present

## 2021-05-12 LAB — CBC WITH DIFFERENTIAL/PLATELET
Abs Immature Granulocytes: 0.05 10*3/uL (ref 0.00–0.07)
Basophils Absolute: 0.1 10*3/uL (ref 0.0–0.1)
Basophils Relative: 1 %
Eosinophils Absolute: 0.3 10*3/uL (ref 0.0–0.5)
Eosinophils Relative: 2 %
HCT: 36.4 % (ref 36.0–46.0)
Hemoglobin: 12.3 g/dL (ref 12.0–15.0)
Immature Granulocytes: 0 %
Lymphocytes Relative: 22 %
Lymphs Abs: 2.9 10*3/uL (ref 0.7–4.0)
MCH: 28.9 pg (ref 26.0–34.0)
MCHC: 33.8 g/dL (ref 30.0–36.0)
MCV: 85.6 fL (ref 80.0–100.0)
Monocytes Absolute: 0.7 10*3/uL (ref 0.1–1.0)
Monocytes Relative: 5 %
Neutro Abs: 9.3 10*3/uL — ABNORMAL HIGH (ref 1.7–7.7)
Neutrophils Relative %: 70 %
Platelets: 356 10*3/uL (ref 150–400)
RBC: 4.25 MIL/uL (ref 3.87–5.11)
RDW: 14.3 % (ref 11.5–15.5)
WBC: 13.3 10*3/uL — ABNORMAL HIGH (ref 4.0–10.5)
nRBC: 0 % (ref 0.0–0.2)

## 2021-05-12 LAB — COMPREHENSIVE METABOLIC PANEL
ALT: 16 U/L (ref 0–44)
AST: 17 U/L (ref 15–41)
Albumin: 3.8 g/dL (ref 3.5–5.0)
Alkaline Phosphatase: 133 U/L — ABNORMAL HIGH (ref 38–126)
Anion gap: 10 (ref 5–15)
BUN: 12 mg/dL (ref 6–20)
CO2: 21 mmol/L — ABNORMAL LOW (ref 22–32)
Calcium: 9.2 mg/dL (ref 8.9–10.3)
Chloride: 106 mmol/L (ref 98–111)
Creatinine, Ser: 0.8 mg/dL (ref 0.44–1.00)
GFR, Estimated: >60 mL/min (ref >60)
Glucose, Bld: 120 mg/dL — ABNORMAL HIGH (ref 70–99)
Potassium: 4.2 mmol/L (ref 3.5–5.1)
Sodium: 137 mmol/L (ref 135–145)
Total Bilirubin: 0.4 mg/dL (ref 0.3–1.2)
Total Protein: 7.3 g/dL (ref 6.5–8.1)

## 2021-05-12 MED ORDER — RIVAROXABAN (XARELTO) VTE STARTER PACK (15 & 20 MG): ORAL_TABLET | ORAL | 0 refills | Status: DC

## 2021-05-12 MED ORDER — IOHEXOL 350 MG/ML SOLN
50.0000 mL | Freq: Once | INTRAVENOUS | Status: AC | PRN
  Administered 2021-05-12: 50 mL via INTRAVENOUS

## 2021-05-12 NOTE — Progress Notes (Signed)
VASCULAR LAB    Left lower extremity venous duplex has been performed.  See CV proc for preliminary results.  Messaged results to Dr. Armandina Gemma and Mervyn Gay, PA-C via secure chat  Mauro Kaufmann, [facility], RVT 05/12/2021, 7:57 AM

## 2021-05-12 NOTE — ED Notes (Signed)
Pt to vascular study

## 2021-05-12 NOTE — ED Notes (Signed)
Patient discharge instructions reviewed with the patient. The patient verbalized understanding. Patient discharged.

## 2021-05-12 NOTE — ED Provider Notes (Signed)
[place] EMERGENCY DEPARTMENT Provider Note   CSN: [id number] Arrival date & time: 05/11/21  2157     History Chief Complaint  Patient presents with   Leg Pain    Robin Lopez is a 40 y.o. female past medical history of cholecystectomy and tubal ligation presenting today complaining of left leg swelling that began yesterday while patient was at work.  She reports that 2 weeks ago she also started a new OCP.  8-hour car trip to see her daughter 1 week ago.  Patient diagnosed with COVID-19 pneumonia 3 weeks ago after being in the hospital with abdominal pain caused by uterine fibroids. No previous DVT/PE. Denies CP, SOB, palpitations since her Covid-19 resolved last week. Brother with Hx of DVT.   Leg Pain Associated symptoms: no fever       History reviewed. No pertinent past medical history.  There are no problems to display for this patient.   Past Surgical History:  Procedure Laterality Date   CESAREAN SECTION     x 2   CHOLECYSTECTOMY     TUBAL LIGATION     WISDOM TOOTH EXTRACTION       OB History     Gravida  3   Para  2   Term  2   Preterm      AB      Living  2      SAB      IAB      Ectopic      Multiple      Live Births              Family History  Problem Relation Age of Onset   Hypertension Father    CAD Father    Deep vein thrombosis Brother     Social History   Tobacco Use   Smoking status: Never   Smokeless tobacco: Never  Substance Use Topics   Alcohol use: No   Drug use: No    Home Medications Prior to Admission medications   Medication Sig Start Date End Date Taking? Authorizing Provider  albuterol (VENTOLIN HFA) 108 (90 Base) MCG/ACT inhaler Inhale 2 puffs into the lungs every 6 (six) hours as needed for wheezing or shortness of breath. Patient not taking: Reported on 04/27/2021 09/19/20   Chevis Pretty, FNP  benzonatate (TESSALON PERLES) 100 MG capsule Take 1 capsule (100 mg total)  by mouth 3 (three) times daily as needed. Patient not taking: Reported on 04/27/2021 09/19/20   Chevis Pretty, FNP  doxycycline (VIBRAMYCIN) 100 MG capsule Take 1 capsule (100 mg total) by mouth 2 (two) times daily. 04/27/21   Burchette, Alinda Sierras, MD  metoprolol tartrate (LOPRESSOR) 100 MG tablet Take one tablet by mouth 2 hours prior to CT Patient not taking: Reported on 04/27/2021 07/06/20   Sueanne Margarita, MD  naproxen (NAPROSYN) 500 MG tablet Take 1 tablet (500 mg total) by mouth 2 (two) times daily with a meal. Patient not taking: Reported on 04/27/2021 09/19/20   Chevis Pretty, FNP    Allergies    Prednisone  Review of Systems   Review of Systems  Constitutional:  Negative for chills and fever.  Respiratory:  Negative for cough, chest tightness and shortness of breath.   Cardiovascular:  Positive for leg swelling. Negative for chest pain and palpitations.  Gastrointestinal:  Negative for abdominal distention.  Genitourinary:  Positive for vaginal bleeding (3 weeks straight with OCP initiation).  Musculoskeletal:  Negative for joint swelling.  Skin:  Negative for color change, pallor and wound.  Neurological:  Negative for dizziness, syncope, light-headedness and headaches.  All other systems reviewed and are negative.  Physical Exam Updated Vital Signs BP 127/75   Pulse 70   Temp 98.6 F (37 C) (Oral)   Resp 18   SpO2 98%   Physical Exam Vitals and nursing note reviewed.  Constitutional:      Appearance: Normal appearance.  HENT:     Head: Normocephalic and atraumatic.  Eyes:     General: No scleral icterus.    Conjunctiva/sclera: Conjunctivae normal.  Cardiovascular:     Rate and Rhythm: Normal rate and regular rhythm.  Pulmonary:     Effort: Pulmonary effort is normal. No respiratory distress.     Breath sounds: Normal breath sounds. No wheezing.  Musculoskeletal:        General: Swelling present. No tenderness or signs of injury. Normal range of  motion.     Comments: LLE with non-pitting edema from the tibial tuberosity to the midfoot. Full ROM of knee and ankle. No pain evoked with movements. No bruising.  Skin:    General: Skin is warm and dry.     Findings: No bruising.  Neurological:     Mental Status: She is alert.  Psychiatric:        Mood and Affect: Mood normal.        Behavior: Behavior normal.    ED Results / Procedures / Treatments   Labs (all labs ordered are listed, but only abnormal results are displayed) Labs Reviewed  CBC WITH DIFFERENTIAL/PLATELET - Abnormal; Notable for the following components:      Result Value   WBC 13.3 (*)    Neutro Abs 9.3 (*)    All other components within normal limits  COMPREHENSIVE METABOLIC PANEL - Abnormal; Notable for the following components:   CO2 21 (*)    Glucose, Bld 120 (*)    Alkaline Phosphatase 133 (*)    All other components within normal limits  I-STAT BETA HCG BLOOD, ED ([facility], [facility], [facility] ONLY)    EKG None  Radiology DG Chest 2 View  Result Date: 05/11/2021 CLINICAL DATA:  Cough. EXAM: CHEST - 2 VIEW COMPARISON:  Chest radiograph dated 04/21/2020 FINDINGS: Shallow inspiration. No focal consolidation, pleural effusion pneumothorax. The cardiac silhouette is within limits. No acute osseous pathology. IMPRESSION: No active cardiopulmonary disease. Electronically Signed   By: Anner Crete M.D.   On: 05/11/2021 23:55   CT Angio Chest PE W and/or Wo Contrast  Result Date: 05/12/2021 CLINICAL DATA:  PE suspected, high prob. Cough, recent initiation of oral contraceptive therapy EXAM: CT ANGIOGRAPHY CHEST WITH CONTRAST TECHNIQUE: Multidetector CT imaging of the chest was performed using the standard protocol during bolus administration of intravenous contrast. Multiplanar CT image reconstructions and MIPs were obtained to evaluate the vascular anatomy. CONTRAST:  12m OMNIPAQUE IOHEXOL 350 MG/ML SOLN COMPARISON:  08/08/2020 FINDINGS: Cardiovascular: Satisfactory  opacification of the pulmonary arteries to the segmental level. No evidence of pulmonary embolism. Normal heart size. No pericardial effusion. Mediastinum/Nodes: No pathologic thoracic adenopathy. Esophagus unremarkable. Lungs/Pleura: There is mosaic attenuation of the a pulmonary parenchyma in keeping with multifocal air trapping, likely related to small airways disease. No superimposed focal pulmonary infiltrate. No pneumothorax or pleural effusion. No central obstructing mass. Upper Abdomen: Status post cholecystectomy.  No acute abnormality. Musculoskeletal: No acute bone abnormality. No lytic or blastic bone lesion Review of the MIP images confirms the above findings. IMPRESSION: No  pulmonary embolism. Mosaic attenuation of the lungs likely related to multifocal air trapping related to small airways disease Electronically Signed   By: Fidela Salisbury M.D.   On: 05/12/2021 02:31    Procedures Procedures   Medications Ordered in ED Medications  iohexol (OMNIPAQUE) 350 MG/ML injection 50 mL (50 mLs Intravenous Contrast Given 05/12/21 0138)    ED Course  I have reviewed the triage vital signs and the nursing notes.  Pertinent labs & imaging results that were available during my care of the patient were reviewed by me and considered in my medical decision making (see chart for details).    MDM Rules/Calculators/A&P 40 year old Female with past medical history of cholecystectomy and tubal ligation presenting today complaining of left leg swelling that began yesterday while patient was at work.  She reports that 2 weeks ago she also started a new OCP.  8-hour car trip to see her daughter 1 week ago.  Patient diagnosed with COVID-19 pneumonia 3 weeks ago after being in the hospital with abdominal pain caused by uterine fibroids. No previous DVT/PE. Denies CP, SOB, palpitations since her Covid-19 resolved last week. Brother with Hx of DVT.  Patient with many risk factors for DVT and is in the high risk  group for a left leg DVT.  When I took over the patient there was also CTPA ordered which was negative.  Patient lab work unremarkable.  Slight elevation and alk phos, and leukocytosis noted however this appears to be chronic.  Left lower extremity ultrasound revealed a left posterior tibial veins.  I will start the patient on Xarelto for the next 30 days until she can follow-up with her doctor within the month.  Patient educated on the medication Xarelto and instructions attached to discharge papers.  She will return if she begins to notice bleeding in her stool, urine or begins to vomit and noticed blood.  She also understands the risks associated with falls and increased bruising.  She is agreeable to discharge with follow-up.  Stable for discharge as she is in no acute distress and her DVT can be managed outpatient.   Final Clinical Impression(s) / ED Diagnoses Final diagnoses:  Acute deep vein thrombosis (DVT) of left lower extremity, unspecified vein ([place])    Rx / DC Orders Results and diagnoses were explained to the patient. Return precautions discussed in full. Patient had no additional questions and expressed complete understanding.     Rhae Hammock, PA-C 05/12/21 0850    Regan Lemming, MD 05/12/21 1622

## 2021-05-15 ENCOUNTER — Other Ambulatory Visit: Payer: Self-pay

## 2021-05-15 ENCOUNTER — Emergency Department (HOSPITAL_COMMUNITY): Payer: 59

## 2021-05-15 ENCOUNTER — Emergency Department (HOSPITAL_COMMUNITY)
Admission: EM | Admit: 2021-05-15 | Discharge: 2021-05-16 | Disposition: A | Discharge status: Home / Self Care | Payer: 59 | Attending: Emergency Medicine | Admitting: Emergency Medicine

## 2021-05-15 DIAGNOSIS — N9489 Other specified conditions associated with female genital organs and menstrual cycle: Secondary | ICD-10-CM | POA: Diagnosis not present

## 2021-05-15 DIAGNOSIS — Z7901 Long term (current) use of anticoagulants: Secondary | ICD-10-CM | POA: Diagnosis not present

## 2021-05-15 DIAGNOSIS — N938 Other specified abnormal uterine and vaginal bleeding: Secondary | ICD-10-CM | POA: Insufficient documentation

## 2021-05-15 DIAGNOSIS — R519 Headache, unspecified: Secondary | ICD-10-CM | POA: Insufficient documentation

## 2021-05-15 LAB — I-STAT CHEM 8, ED
BUN: 8 mg/dL (ref 6–20)
Calcium, Ion: 1.19 mmol/L (ref 1.15–1.40)
Chloride: 107 mmol/L (ref 98–111)
Creatinine, Ser: 0.8 mg/dL (ref 0.44–1.00)
Glucose, Bld: 164 mg/dL — ABNORMAL HIGH (ref 70–99)
HCT: 35 % — ABNORMAL LOW (ref 36.0–46.0)
Hemoglobin: 11.9 g/dL — ABNORMAL LOW (ref 12.0–15.0)
Potassium: 4.3 mmol/L (ref 3.5–5.1)
Sodium: 140 mmol/L (ref 135–145)
TCO2: 24 mmol/L (ref 22–32)

## 2021-05-15 LAB — I-STAT BETA HCG BLOOD, ED (MC, WL, AP ONLY): I-stat hCG, quantitative: <5 m[IU]/mL (ref <5)

## 2021-05-15 NOTE — ED Provider Notes (Signed)
Emergency Medicine Provider Triage Evaluation Note  Robin Lopez , a 40 y.o. female  was evaluated in triage.  Pt complains of headache that started this am. States pain is severe and she does not typically get headaches, no vision changes, numbness or weakness. Recently dx with dvt and started an anticoagulation. Had covid a few weeks ago  Review of Systems  Positive: Headache, leg pain Negative: fever  Physical Exam  BP 136/87 (BP Location: Left Arm)   Pulse 92   Temp 99.3 F (37.4 C) (Oral)   Resp 16   Ht '5\' 6"'$  (1.676 m)   Wt 99.8 kg   SpO2 100%   BMI 35.51 kg/m  Gen:   Awake, no distress   Resp:  Normal effort  MSK:   Moves extremities without difficulty  Other:    Medical Decision Making  Medically screening exam initiated at 7:59 PM.  Appropriate orders placed.  [name] was informed that the remainder of the evaluation will be completed by another provider, this initial triage assessment does not replace that evaluation, and the importance of remaining in the ED until their evaluation is complete.     Laporcha Marchesi S, PA-C [id number]    Lianne Cure, DO [id number]

## 2021-05-15 NOTE — ED Triage Notes (Signed)
Pt reports a headache and left calf pain. Pt also states that she started her menstrual cycle today and is bleeding extremely heavy. Pt rates her headache a 10 out of 10.

## 2021-05-16 ENCOUNTER — Telehealth (INDEPENDENT_AMBULATORY_CARE_PROVIDER_SITE_OTHER): Payer: 59 | Admitting: Family Medicine

## 2021-05-16 DIAGNOSIS — Z86718 Personal history of other venous thrombosis and embolism: Secondary | ICD-10-CM | POA: Insufficient documentation

## 2021-05-16 DIAGNOSIS — I82442 Acute embolism and thrombosis of left tibial vein: Secondary | ICD-10-CM

## 2021-05-16 DIAGNOSIS — D219 Benign neoplasm of connective and other soft tissue, unspecified: Secondary | ICD-10-CM

## 2021-05-16 DIAGNOSIS — D259 Leiomyoma of uterus, unspecified: Secondary | ICD-10-CM | POA: Insufficient documentation

## 2021-05-16 DIAGNOSIS — N924 Excessive bleeding in the premenopausal period: Secondary | ICD-10-CM | POA: Diagnosis not present

## 2021-05-16 LAB — HEMOGLOBIN AND HEMATOCRIT, BLOOD
HCT: 34.8 % — ABNORMAL LOW (ref 36.0–46.0)
Hemoglobin: 11.8 g/dL — ABNORMAL LOW (ref 12.0–15.0)

## 2021-05-16 MED ORDER — SODIUM CHLORIDE 0.9 % IV BOLUS
1000.0000 mL | Freq: Once | INTRAVENOUS | Status: AC
  Administered 2021-05-16: 1000 mL via INTRAVENOUS

## 2021-05-16 MED ORDER — PROCHLORPERAZINE EDISYLATE 10 MG/2ML IJ SOLN
10.0000 mg | Freq: Once | INTRAMUSCULAR | Status: AC
  Administered 2021-05-16: 10 mg via INTRAVENOUS
  Filled 2021-05-16: qty 2

## 2021-05-16 MED ORDER — DIPHENHYDRAMINE HCL 50 MG/ML IJ SOLN
25.0000 mg | Freq: Once | INTRAMUSCULAR | Status: AC
  Administered 2021-05-16: 25 mg via INTRAVENOUS
  Filled 2021-05-16: qty 1

## 2021-05-16 NOTE — Discharge Instructions (Addendum)
You were seen today for increased uterine bleeding as well as headache.  Your hemoglobin is stable.  Monitor your bleeding closely.  If you start bleeding through more than 1 pad per hour or develop dizziness, you should follow-up with your OB/GYN.  Given that you are now on a blood thinner, this is more common for you to have heavier longer periods.  Regarding your headache, your CT scan is reassuring.

## 2021-05-16 NOTE — ED Provider Notes (Signed)
[facility] EMERGENCY DEPARTMENT Provider Note   CSN: [id number] Arrival date & time: 05/15/21  1905     History Chief Complaint  Patient presents with   Headache   Leg Pain    Robin Lopez is a 40 y.o. female.  HPI     This is a 40 year old female with recent history of DVT started on Xarelto who presents with menorrhagia and headache.  Patient reports that she was diagnosed with a DVT this past week.  She was started on Xarelto.  She started her menstrual period today reports passage of clots.  She states that every time she stands up she feels like she has to go to the bathroom and passes a clot.  No significant abdominal pain.  She has had a headache since this morning.  She states that it is throbbing.  She has not taken anything for her pain.  She denies vision changes, weakness, numbness, tingling, strokelike symptoms.  She does not have a history of headaches or migraines.  She does not normally get headaches with her periods.  Patient did recently recover from COVID-19.  No past medical history on file.  There are no problems to display for this patient.   Past Surgical History:  Procedure Laterality Date   CESAREAN SECTION     x 2   CHOLECYSTECTOMY     TUBAL LIGATION     WISDOM TOOTH EXTRACTION       OB History     Gravida  3   Para  2   Term  2   Preterm      AB      Living  2      SAB      IAB      Ectopic      Multiple      Live Births              Family History  Problem Relation Age of Onset   Hypertension Father    CAD Father    Deep vein thrombosis Brother     Social History   Tobacco Use   Smoking status: Never   Smokeless tobacco: Never  Substance Use Topics   Alcohol use: No   Drug use: No    Home Medications Prior to Admission medications   Medication Sig Start Date End Date Taking? Authorizing Provider  albuterol (VENTOLIN HFA) 108 (90 Base) MCG/ACT inhaler Inhale 2 puffs into the lungs  every 6 (six) hours as needed for wheezing or shortness of breath. Patient not taking: Reported on 04/27/2021 09/19/20   Chevis Pretty, FNP  benzonatate (TESSALON PERLES) 100 MG capsule Take 1 capsule (100 mg total) by mouth 3 (three) times daily as needed. Patient not taking: Reported on 04/27/2021 09/19/20   Chevis Pretty, FNP  doxycycline (VIBRAMYCIN) 100 MG capsule Take 1 capsule (100 mg total) by mouth 2 (two) times daily. 04/27/21   Burchette, Alinda Sierras, MD  metoprolol tartrate (LOPRESSOR) 100 MG tablet Take one tablet by mouth 2 hours prior to CT Patient not taking: Reported on 04/27/2021 07/06/20   Sueanne Margarita, MD  naproxen (NAPROSYN) 500 MG tablet Take 1 tablet (500 mg total) by mouth 2 (two) times daily with a meal. Patient not taking: Reported on 04/27/2021 09/19/20   Chevis Pretty, FNP  RIVAROXABAN Alveda Reasons) VTE STARTER PACK (15 & 20 MG) Follow package directions: Take one '15mg'$  tablet by mouth twice a day. On day 22, switch to one '20mg'$  tablet  once a day. Take with food. 05/12/21   Redwine, Madison A, PA-C    Allergies    Prednisone  Review of Systems   Review of Systems  Constitutional:  Negative for fever.  Respiratory:  Negative for shortness of breath.   Cardiovascular:  Positive for leg swelling. Negative for chest pain.  Gastrointestinal:  Negative for abdominal pain, nausea and vomiting.  Genitourinary:  Positive for vaginal bleeding. Negative for dysuria.  Neurological:  Positive for headaches.  All other systems reviewed and are negative.  Physical Exam Updated Vital Signs BP 110/67 (BP Location: Right Arm)   Pulse 74   Temp 98.2 F (36.8 C)   Resp 17   Ht 1.676 m ('5\' 6"'$ )   Wt 99.8 kg   SpO2 100%   BMI 35.51 kg/m   Physical Exam Vitals and nursing note reviewed.  Constitutional:      Appearance: She is well-developed. She is obese. She is not ill-appearing.  HENT:     Head: Normocephalic and atraumatic.  Eyes:     Pupils: Pupils are  equal, round, and reactive to light.  Cardiovascular:     Rate and Rhythm: Normal rate and regular rhythm.     Heart sounds: Normal heart sounds.  Pulmonary:     Effort: Pulmonary effort is normal. No respiratory distress.     Breath sounds: No wheezing.  Abdominal:     Palpations: Abdomen is soft.     Tenderness: There is no abdominal tenderness.  Genitourinary:    Comments: Deferred Musculoskeletal:     Cervical back: Neck supple.  Skin:    General: Skin is warm and dry.  Neurological:     Mental Status: She is alert and oriented to person, place, and time.     Comments: Cranial nerves II through XII intact, 5 out of 5 strength in all 4 extremities, normal gait  Psychiatric:        Mood and Affect: Mood normal.    ED Results / Procedures / Treatments   Labs (all labs ordered are listed, but only abnormal results are displayed) Labs Reviewed  HEMOGLOBIN AND HEMATOCRIT, BLOOD - Abnormal; Notable for the following components:      Result Value   Hemoglobin 11.8 (*)    HCT 34.8 (*)    All other components within normal limits  I-STAT CHEM 8, ED - Abnormal; Notable for the following components:   Glucose, Bld 164 (*)    Hemoglobin 11.9 (*)    HCT 35.0 (*)    All other components within normal limits  I-STAT BETA HCG BLOOD, ED ([facility], [facility], [facility] ONLY)    EKG None  Radiology CT HEAD WO CONTRAST (5MM)  Result Date: 05/15/2021 CLINICAL DATA:  Headache EXAM: CT HEAD WITHOUT CONTRAST TECHNIQUE: Contiguous axial images were obtained from the base of the skull through the vertex without intravenous contrast. COMPARISON:  10/06/2012 FINDINGS: Brain: There is no mass, hemorrhage or extra-axial collection. The size and configuration of the ventricles and extra-axial CSF spaces are normal. The brain parenchyma is normal, without acute or chronic infarction. Vascular: No abnormal hyperdensity of the major intracranial arteries or dural venous sinuses. No intracranial atherosclerosis. Skull:  The visualized skull base, calvarium and extracranial soft tissues are normal. Sinuses/Orbits: No fluid levels or advanced mucosal thickening of the visualized paranasal sinuses. No mastoid or middle ear effusion. The orbits are normal. IMPRESSION: Normal head CT. Electronically Signed   By: Ulyses Jarred M.D.   On: 05/15/2021 21:23  Procedures Procedures   Medications Ordered in ED Medications  sodium chloride 0.9 % bolus 1,000 mL (1,000 mLs Intravenous New Bag/Given 05/16/21 [id number])  prochlorperazine (COMPAZINE) injection 10 mg (10 mg Intravenous Given 05/16/21 [id number])  diphenhydrAMINE (BENADRYL) injection 25 mg (25 mg Intravenous Given 05/16/21 [id number])    ED Course  I have reviewed the triage vital signs and the nursing notes.  Pertinent labs & imaging results that were available during my care of the patient were reviewed by me and considered in my medical decision making (see chart for details).    MDM Rules/Calculators/A&P                           Patient presents with headache and heavy uterine bleeding.  Recently started anticoagulation for DVT.  She is nontoxic and vital signs are reassuring.  She is not tachycardic or hypotensive.  Regarding her headache, she has no red flags.  No focal deficits.  CT scan reviewed and shows no evidence of acute bleed.  Would have lower suspicion for venous dural thrombosis given she is on appropriate anticoagulation.  Initial hemoglobin 11.  Repeat hemoglobin obtained as well stable.  Patient reports significant provement after migraine cocktail.  Suspect that her menstrual cycle may be a component.  She was also treated with fluids while she was here.  Discussed with her bleeding precautions as well as red flags for headache.  She will follow-up with her primary physician.  She will follow-up with OB/GYN if she develops worsening bleeding or prolonged bleeding as she may require Megace as she is on new anticoagulation.  After history, exam, and medical  workup I feel the patient has been appropriately medically screened and is safe for discharge home. Pertinent diagnoses were discussed with the patient. Patient was given return precautions.  Final Clinical Impression(s) / ED Diagnoses Final diagnoses:  Bad headache  Dysfunctional uterine bleeding    Rx / DC Orders ED Discharge Orders     None        Merryl Hacker, MD 05/16/21 [phone]

## 2021-05-16 NOTE — Progress Notes (Signed)
Patient ID: Robin Lopez, female   DOB: [birth year], 40 y.o.   MRN: [id number]  This visit type was conducted due to national recommendations for restrictions regarding the COVID-19 pandemic in an effort to limit this patient's exposure and mitigate transmission in our community.   Virtual Visit via Video Note  I connected with Cindra Eves on 05/16/21 at  5:30 PM EDT by a video enabled telemedicine application and verified that I am speaking with the correct person using two identifiers.  Location patient: home Location provider:work or home office Persons participating in the virtual visit: patient, provider  I discussed the limitations of evaluation and management by telemedicine and the availability of in person appointments. The patient expressed understanding and agreed to proceed.   HPI:  Fleeta had COVID 19 infection back around the middle of August.  She seemed to be slowly recovering but then had second wave of fever and eventually was started on doxycycline.  She was doing relatively well and then developed some left calf pain and went to the ER on September 9.  Venous Doppler revealed acute DVT left posterior tibial vein.  She has not had prior history of DVT or PE but does have a brother who has had coagulopathy issues.  Her history is also significant for the fact that she had been started by [facility] on OCP 1 week prior to her DVT and also had 8-hour car trip the week prior.  She is a non-smoker.  She has had previous tubal ligation so not clear why [facility] had placed her on OCPs.  She also states that her last menstrual period started around 15 August.  She had relatively normal flow for about a week and then no spotting for several days but then the past 3 weeks has had some heavier bleeding and especially over the past few days.  She was placed on the night on Xarelto and currently taking 50 mg twice daily.  She states she went through 10 pads yesterday  and roughly 10 pads today.  She was seen back in the ER last night and complained of some diffuse headache.  CT head no acute findings.  Hemoglobin stable at 11.8.  She has pending follow-up with GYN on the 21st of this month.  Recent CT abdomen pelvis revealed fibroid uterus 17 cm with some mass-effect on the bladder and distal ureter.  She is currently taking no medications other than the Xarelto.   ROS: See pertinent positives and negatives per HPI.  No past medical history on file.  Past Surgical History:  Procedure Laterality Date   CESAREAN SECTION     x 2   CHOLECYSTECTOMY     TUBAL LIGATION     WISDOM TOOTH EXTRACTION      Family History  Problem Relation Age of Onset   Hypertension Father    CAD Father    Deep vein thrombosis Brother     SOCIAL HX: Non-smoker   Current Outpatient Medications:    albuterol (VENTOLIN HFA) 108 (90 Base) MCG/ACT inhaler, Inhale 2 puffs into the lungs every 6 (six) hours as needed for wheezing or shortness of breath. (Patient not taking: Reported on 04/27/2021), Disp: 8 g, Rfl: 0   metoprolol tartrate (LOPRESSOR) 100 MG tablet, Take one tablet by mouth 2 hours prior to CT (Patient not taking: Reported on 04/27/2021), Disp: 1 tablet, Rfl: 0   RIVAROXABAN (XARELTO) VTE STARTER PACK (15 & 20 MG), Follow package directions: Take one '15mg'$  tablet by  mouth twice a day. On day 22, switch to one '20mg'$  tablet once a day. Take with food., Disp: 51 each, Rfl: 0  EXAM:  VITALS per patient if applicable:  GENERAL: alert, oriented, appears well and in no acute distress  HEENT: atraumatic, conjunttiva clear, no obvious abnormalities on inspection of external nose and ears  NECK: normal movements of the head and neck  LUNGS: on inspection no signs of respiratory distress, breathing rate appears normal, no obvious gross SOB, gasping or wheezing  CV: no obvious cyanosis  MS: moves all visible extremities without noticeable abnormality  PSYCH/NEURO:  pleasant and cooperative, no obvious depression or anxiety, speech and thought processing grossly intact  ASSESSMENT AND PLAN:  Discussed the following assessment and plan:  #1 recent COVID-19 infection.  Patient recovered from respiratory standpoint.  #2 recent acute DVT left posterior tibial vein.  Multiple risk factors including recent COVID infection, sedentary job, recent 8-hour car trip, OCP use for 1 week prior to DVT, obesity, family history of DVT/PE in brother.  CTA chest no PE.  Patient currently on Xarelto 15 mg twice daily for 3 weeks then transition to 20 mg once daily.  #3 menorrhagia.  This complicates her anticoagulation therapy.  Likely related to fibroids.  Pending follow-up with GYN.  We will try to discuss with him earlier than her appointment to see if they have any other suggestions if she continues to have heavy bleeding.  Fortunately, her hemoglobin yesterday was stable at 11.8     I discussed the assessment and treatment plan with the patient. The patient was provided an opportunity to ask questions and all were answered. The patient agreed with the plan and demonstrated an understanding of the instructions.   The patient was advised to call back or seek an in-person evaluation if the symptoms worsen or if the condition fails to improve as anticipated.     Carolann Littler, MD

## 2021-05-16 NOTE — ED Notes (Signed)
Reviewed discharge instructions with patient. Follow-up care reviewed. Patient verbalized understanding. Patient A&Ox4, VSS, and ambulatory with steady gait upon discharge.  

## 2021-05-24 ENCOUNTER — Emergency Department (HOSPITAL_BASED_OUTPATIENT_CLINIC_OR_DEPARTMENT_OTHER): Payer: 59

## 2021-05-24 ENCOUNTER — Other Ambulatory Visit: Payer: Self-pay

## 2021-05-24 ENCOUNTER — Encounter: Payer: Self-pay | Admitting: Family Medicine

## 2021-05-24 ENCOUNTER — Emergency Department (HOSPITAL_BASED_OUTPATIENT_CLINIC_OR_DEPARTMENT_OTHER)
Admission: EM | Admit: 2021-05-24 | Discharge: 2021-05-24 | Disposition: A | Discharge status: Home / Self Care | Payer: 59 | Attending: Emergency Medicine | Admitting: Emergency Medicine

## 2021-05-24 ENCOUNTER — Emergency Department (HOSPITAL_BASED_OUTPATIENT_CLINIC_OR_DEPARTMENT_OTHER): Payer: 59 | Admitting: Radiology

## 2021-05-24 DIAGNOSIS — Z7901 Long term (current) use of anticoagulants: Secondary | ICD-10-CM | POA: Diagnosis not present

## 2021-05-24 DIAGNOSIS — R0602 Shortness of breath: Secondary | ICD-10-CM | POA: Insufficient documentation

## 2021-05-24 DIAGNOSIS — Z86718 Personal history of other venous thrombosis and embolism: Secondary | ICD-10-CM | POA: Insufficient documentation

## 2021-05-24 DIAGNOSIS — R0789 Other chest pain: Secondary | ICD-10-CM | POA: Diagnosis not present

## 2021-05-24 DIAGNOSIS — R059 Cough, unspecified: Secondary | ICD-10-CM | POA: Insufficient documentation

## 2021-05-24 DIAGNOSIS — R079 Chest pain, unspecified: Secondary | ICD-10-CM

## 2021-05-24 LAB — BASIC METABOLIC PANEL
Anion gap: 10 (ref 5–15)
BUN: 17 mg/dL (ref 6–20)
CO2: 23 mmol/L (ref 22–32)
Calcium: 9.4 mg/dL (ref 8.9–10.3)
Chloride: 105 mmol/L (ref 98–111)
Creatinine, Ser: 0.81 mg/dL (ref 0.44–1.00)
GFR, Estimated: >60 mL/min (ref >60)
Glucose, Bld: 109 mg/dL — ABNORMAL HIGH (ref 70–99)
Potassium: 4 mmol/L (ref 3.5–5.1)
Sodium: 138 mmol/L (ref 135–145)

## 2021-05-24 LAB — PREGNANCY, URINE: Preg Test, Ur: NEGATIVE

## 2021-05-24 LAB — CBC
HCT: 31.3 % — ABNORMAL LOW (ref 36.0–46.0)
Hemoglobin: 10.5 g/dL — ABNORMAL LOW (ref 12.0–15.0)
MCH: 28.1 pg (ref 26.0–34.0)
MCHC: 33.5 g/dL (ref 30.0–36.0)
MCV: 83.7 fL (ref 80.0–100.0)
Platelets: 485 10*3/uL — ABNORMAL HIGH (ref 150–400)
RBC: 3.74 MIL/uL — ABNORMAL LOW (ref 3.87–5.11)
RDW: 13.5 % (ref 11.5–15.5)
WBC: 13.7 10*3/uL — ABNORMAL HIGH (ref 4.0–10.5)
nRBC: 0 % (ref 0.0–0.2)

## 2021-05-24 LAB — TROPONIN I (HIGH SENSITIVITY)
Troponin I (High Sensitivity): <2 ng/L (ref <18)
Troponin I (High Sensitivity): 2 ng/L (ref <18)

## 2021-05-24 MED ORDER — IOHEXOL 350 MG/ML SOLN
75.0000 mL | Freq: Once | INTRAVENOUS | Status: AC | PRN
  Administered 2021-05-24: 75 mL via INTRAVENOUS

## 2021-05-24 NOTE — Telephone Encounter (Signed)
Please advise in Dr. Erick Blinks absence.

## 2021-05-24 NOTE — ED Triage Notes (Signed)
Patient reports to the ER for chest pain that is sharp and pressured. Patient states she was recently diagnosed with a blood clot and started on Xarelto.

## 2021-05-24 NOTE — ED Provider Notes (Signed)
[place] EMERGENCY DEPT Provider Note   CSN: [id number] Arrival date & time: 05/24/21  1546     History Chief Complaint  Patient presents with   Chest Pain    Robin Lopez is a 40 y.o. female past medical history significant for recent DVT, recent COVID-pneumonia, recently started on birth control who presents with sharp, tight chest pain for the last 3 days.  Patient also endorses some shortness of breath with exertion.  Patient has had an intermittent cough without hemoptysis.  Patient denies any radiation of her chest pain.  Patient does not have diabetes, does not have a history of tobacco use, does not have any history of coronary artery disease.  Patient does endorse prior heart attack in her family.  Patient was started on Xarelto for her DVT and has been taking it as prescribed.  Her DVT was diagnosed on 9 September.   Chest Pain Associated symptoms: no shortness of breath       No past medical history on file.  Patient Active Problem List   Diagnosis Date Noted   Acute DVT of left tibial vein ([place]) 05/16/2021   Fibroids 05/16/2021    Past Surgical History:  Procedure Laterality Date   CESAREAN SECTION     x 2   CHOLECYSTECTOMY     TUBAL LIGATION     WISDOM TOOTH EXTRACTION       OB History     Gravida  3   Para  2   Term  2   Preterm      AB      Living  2      SAB      IAB      Ectopic      Multiple      Live Births              Family History  Problem Relation Age of Onset   Hypertension Father    CAD Father    Deep vein thrombosis Brother     Social History   Tobacco Use   Smoking status: Never   Smokeless tobacco: Never  Substance Use Topics   Alcohol use: No   Drug use: No    Home Medications Prior to Admission medications   Medication Sig Start Date End Date Taking? Authorizing Provider  RIVAROXABAN Alveda Reasons) VTE STARTER PACK (15 & 20 MG) Follow package directions: Take one 15mg  tablet by  mouth twice a day. On day 22, switch to one 20mg  tablet once a day. Take with food. 05/12/21  Yes Redwine, Madison A, PA-C  albuterol (VENTOLIN HFA) 108 (90 Base) MCG/ACT inhaler Inhale 2 puffs into the lungs every 6 (six) hours as needed for wheezing or shortness of breath. Patient not taking: No sig reported 09/19/20   Chevis Pretty, FNP  metoprolol tartrate (LOPRESSOR) 100 MG tablet Take one tablet by mouth 2 hours prior to CT Patient not taking: No sig reported 07/06/20   Sueanne Margarita, MD    Allergies    Prednisone  Review of Systems   Review of Systems  Respiratory:  Positive for chest tightness. Negative for shortness of breath.   Cardiovascular:  Positive for chest pain.  All other systems reviewed and are negative.  Physical Exam Updated Vital Signs BP 115/73 (BP Location: Left Arm)   Pulse 80   Temp 98.7 F (37.1 C)   Resp 16   Ht 5\' 6"  (1.676 m)   Wt 108.9 kg   LMP 04/15/2021 (  Exact Date)   SpO2 100%   Breastfeeding No   BMI 38.74 kg/m   Physical Exam Vitals and nursing note reviewed.  Constitutional:      General: She is not in acute distress.    Appearance: Normal appearance.  HENT:     Head: Normocephalic and atraumatic.  Eyes:     General:        Right eye: No discharge.        Left eye: No discharge.  Cardiovascular:     Rate and Rhythm: Normal rate and regular rhythm.     Heart sounds: No murmur heard.   No friction rub. No gallop.     Comments: Tenderness to palpation of the chest wall at this time.  Patient was tachycardic on arrival, normal rate on reevaluation. Pulmonary:     Effort: Pulmonary effort is normal.     Breath sounds: Normal breath sounds.  Abdominal:     General: Bowel sounds are normal.     Palpations: Abdomen is soft.     Comments: No tenderness to palpation throughout all abdominal fields.  Skin:    General: Skin is warm and dry.     Capillary Refill: Capillary refill takes less than 2 seconds.     Comments: Some  remaining swelling of the left calf, without acute tenderness, redness.  Neurological:     Mental Status: She is alert and oriented to person, place, and time.  Psychiatric:        Mood and Affect: Mood normal.        Behavior: Behavior normal.    ED Results / Procedures / Treatments   Labs (all labs ordered are listed, but only abnormal results are displayed) Labs Reviewed  BASIC METABOLIC PANEL - Abnormal; Notable for the following components:      Result Value   Glucose, Bld 109 (*)    All other components within normal limits  CBC - Abnormal; Notable for the following components:   WBC 13.7 (*)    RBC 3.74 (*)    Hemoglobin 10.5 (*)    HCT 31.3 (*)    Platelets 485 (*)    All other components within normal limits  PREGNANCY, URINE  TROPONIN I (HIGH SENSITIVITY)  TROPONIN I (HIGH SENSITIVITY)    EKG EKG Interpretation  Date/Time:  Thursday May 24 2021 15:53:58 EDT Ventricular Rate:  97 PR Interval:  146 QRS Duration: 82 QT Interval:  364 QTC Calculation: 462 R Axis:   57 Text Interpretation: Normal sinus rhythm Normal ECG No significant change since prior 8/21 Confirmed by Aletta Edouard [phone]) on 05/24/2021 3:56:22 PM  Radiology DG Chest 2 View  Result Date: 05/24/2021 CLINICAL DATA:  chest pain EXAM: CHEST - 2 VIEW COMPARISON:  05/11/2021. FINDINGS: The heart size and mediastinal contours are within normal limits. Both lungs are clear. No visible pleural effusions or pneumothorax. No acute osseous abnormality. IMPRESSION: No evidence of acute cardiopulmonary disease. Electronically Signed   By: Margaretha Sheffield M.D.   On: 05/24/2021 16:52   CT Angio Chest PE W and/or Wo Contrast  Result Date: 05/24/2021 CLINICAL DATA:  Pulmonary embolus suspected with high probability. Sharp chest pain and pressure. Recent diagnosis of a blood clot and started on Xarelto. EXAM: CT ANGIOGRAPHY CHEST WITH CONTRAST TECHNIQUE: Multidetector CT imaging of the chest was performed  using the standard protocol during bolus administration of intravenous contrast. Multiplanar CT image reconstructions and MIPs were obtained to evaluate the vascular anatomy. CONTRAST:  60mL OMNIPAQUE  IOHEXOL 350 MG/ML SOLN COMPARISON:  05/12/2021 FINDINGS: Cardiovascular: Motion artifact limits examination. Moderately good opacification of the central and segmental pulmonary arteries. No focal filling defects are demonstrated. No evidence of significant pulmonary embolus. Peripheral pulmonary arteries may be obscured. Normal heart size. No pericardial effusions. Normal caliber thoracic aorta. No aortic dissection. Great vessel origins are patent. Mediastinum/Nodes: Esophagus is decompressed. No significant lymphadenopathy. Left thyroid nodule measuring about 1.3 cm diameter. Lungs/Pleura: Lungs are clear. No airspace disease or consolidation. No pleural effusions. No pneumothorax. Airways are patent. Upper Abdomen: Surgical absence of the gallbladder. Spleen is enlarged. Musculoskeletal: No chest wall abnormality. No acute or significant osseous findings. Review of the MIP images confirms the above findings. IMPRESSION: 1. No evidence of significant pulmonary embolus. 2. 1.3 cm diameter left thyroid nodule. 1.3 cm incidental left thyroid nodule. No follow-up imaging is recommended. Reference: J Am Coll Radiol. 2015 Feb;12(2): 143-50 3. Lungs are clear. Electronically Signed   By: Lucienne Capers M.D.   On: 05/24/2021 21:36    Procedures Procedures   Medications Ordered in ED Medications  iohexol (OMNIPAQUE) 350 MG/ML injection 75 mL (75 mLs Intravenous Contrast Given 05/24/21 2112)    ED Course  I have reviewed the triage vital signs and the nursing notes.  Pertinent labs & imaging results that were available during my care of the patient were reviewed by me and considered in my medical decision making (see chart for details).    MDM Rules/Calculators/A&P                         Patient with  reassuring work-up for chest pain including negative troponin, normal chest x-ray, normal EKG.  Low clinical suspicion for Boerhaave syndrome, cardiac tamponade, acute aortic dissection at this time.  Given recent history of DVT will obtain a CT angio of the chest to rule out acute PE at this time.  Favor low suspicion for PE considering patient is actively anticoagulated on Xarelto at this time.  Patiently patient's presentation is not consistent with other emergent skeletal causes of chest pain including flail chest, costo chondritis, broken ribs of any kind.  CT angio does not reveal an acute PE at this time.  Patient's other work-up extremely reassuring.  At this time patient discharged in stable condition with extensive return precautions. Final Clinical Impression(s) / ED Diagnoses Final diagnoses:  Chest pain, unspecified type    Rx / DC Orders ED Discharge Orders     None        Dorien Chihuahua 05/24/21 2153    Hayden Rasmussen, MD 05/25/21 1010

## 2021-05-25 ENCOUNTER — Encounter: Payer: Self-pay | Admitting: Family Medicine

## 2021-05-28 ENCOUNTER — Telehealth: Payer: Self-pay | Admitting: Family Medicine

## 2021-05-28 DIAGNOSIS — D649 Anemia, unspecified: Secondary | ICD-10-CM

## 2021-05-28 NOTE — Telephone Encounter (Signed)
Please advise 

## 2021-05-28 NOTE — Telephone Encounter (Signed)
Please advise. I do not see any notes regarding this in the patient's chart.

## 2021-05-28 NOTE — Telephone Encounter (Signed)
Patient needs orders to get hemoglobin checked. Patient states she was discharged from hospital on Friday and her hemoglobin was a 10.6. Also states she spoke with Dr.Burchette and he said if she was not feeling well, she could have it checked in office.    Good callback number is [phone]    Please Advise

## 2021-05-30 ENCOUNTER — Other Ambulatory Visit: Payer: Self-pay

## 2021-05-30 ENCOUNTER — Encounter (HOSPITAL_COMMUNITY): Payer: Self-pay | Admitting: Emergency Medicine

## 2021-05-30 ENCOUNTER — Emergency Department (HOSPITAL_COMMUNITY)
Admission: EM | Admit: 2021-05-30 | Discharge: 2021-05-30 | Disposition: A | Discharge status: Home / Self Care | Payer: 59 | Attending: Emergency Medicine | Admitting: Emergency Medicine

## 2021-05-30 DIAGNOSIS — Z7901 Long term (current) use of anticoagulants: Secondary | ICD-10-CM | POA: Insufficient documentation

## 2021-05-30 DIAGNOSIS — R531 Weakness: Secondary | ICD-10-CM | POA: Insufficient documentation

## 2021-05-30 DIAGNOSIS — Z86718 Personal history of other venous thrombosis and embolism: Secondary | ICD-10-CM | POA: Diagnosis not present

## 2021-05-30 DIAGNOSIS — R0602 Shortness of breath: Secondary | ICD-10-CM | POA: Insufficient documentation

## 2021-05-30 DIAGNOSIS — R079 Chest pain, unspecified: Secondary | ICD-10-CM | POA: Diagnosis not present

## 2021-05-30 DIAGNOSIS — R42 Dizziness and giddiness: Secondary | ICD-10-CM | POA: Diagnosis not present

## 2021-05-30 DIAGNOSIS — N939 Abnormal uterine and vaginal bleeding, unspecified: Secondary | ICD-10-CM | POA: Diagnosis not present

## 2021-05-30 DIAGNOSIS — N898 Other specified noninflammatory disorders of vagina: Secondary | ICD-10-CM | POA: Diagnosis not present

## 2021-05-30 LAB — CBC WITH DIFFERENTIAL/PLATELET
Abs Immature Granulocytes: 0.13 10*3/uL — ABNORMAL HIGH (ref 0.00–0.07)
Basophils Absolute: 0.1 10*3/uL (ref 0.0–0.1)
Basophils Relative: 1 %
Eosinophils Absolute: 0.2 10*3/uL (ref 0.0–0.5)
Eosinophils Relative: 2 %
HCT: 29.8 % — ABNORMAL LOW (ref 36.0–46.0)
Hemoglobin: 9.9 g/dL — ABNORMAL LOW (ref 12.0–15.0)
Immature Granulocytes: 1 %
Lymphocytes Relative: 19 %
Lymphs Abs: 2.8 10*3/uL (ref 0.7–4.0)
MCH: 28.2 pg (ref 26.0–34.0)
MCHC: 33.2 g/dL (ref 30.0–36.0)
MCV: 84.9 fL (ref 80.0–100.0)
Monocytes Absolute: 0.7 10*3/uL (ref 0.1–1.0)
Monocytes Relative: 5 %
Neutro Abs: 10.6 10*3/uL — ABNORMAL HIGH (ref 1.7–7.7)
Neutrophils Relative %: 72 %
Platelets: 464 10*3/uL — ABNORMAL HIGH (ref 150–400)
RBC: 3.51 MIL/uL — ABNORMAL LOW (ref 3.87–5.11)
RDW: 13.3 % (ref 11.5–15.5)
WBC: 14.6 10*3/uL — ABNORMAL HIGH (ref 4.0–10.5)
nRBC: 0 % (ref 0.0–0.2)

## 2021-05-30 LAB — BASIC METABOLIC PANEL
Anion gap: 7 (ref 5–15)
BUN: 11 mg/dL (ref 6–20)
CO2: 23 mmol/L (ref 22–32)
Calcium: 9 mg/dL (ref 8.9–10.3)
Chloride: 106 mmol/L (ref 98–111)
Creatinine, Ser: 0.8 mg/dL (ref 0.44–1.00)
GFR, Estimated: >60 mL/min (ref >60)
Glucose, Bld: 96 mg/dL (ref 70–99)
Potassium: 4.4 mmol/L (ref 3.5–5.1)
Sodium: 136 mmol/L (ref 135–145)

## 2021-05-30 LAB — I-STAT BETA HCG BLOOD, ED (MC, WL, AP ONLY): I-stat hCG, quantitative: <5 m[IU]/mL (ref <5)

## 2021-05-30 LAB — PREPARE RBC (CROSSMATCH)

## 2021-05-30 LAB — WET PREP, GENITAL
Clue Cells Wet Prep HPF POC: NONE SEEN
Sperm: NONE SEEN
Trich, Wet Prep: NONE SEEN
WBC, Wet Prep HPF POC: NONE SEEN
Yeast Wet Prep HPF POC: NONE SEEN

## 2021-05-30 LAB — ABO/RH: ABO/RH(D): A POS

## 2021-05-30 MED ORDER — SODIUM CHLORIDE 0.9 % IV SOLN: 10.0000 mL/h | Freq: Once | INTRAVENOUS | Status: DC

## 2021-05-30 NOTE — Telephone Encounter (Signed)
Left message for patient to call back  

## 2021-05-30 NOTE — ED Notes (Signed)
Pt verbalizes understanding of discharge instructions. Opportunity for questions and answers were provided. Pt discharged from the ED.   ?

## 2021-05-30 NOTE — Telephone Encounter (Signed)
I put in the orders. She can make a lab appt

## 2021-05-30 NOTE — Telephone Encounter (Signed)
She can follow up with Dr. Elease Hashimoto

## 2021-05-30 NOTE — ED Notes (Signed)
Blood consent obtained by this RN, pts signature witnessed by this RN. Pt able to sign with signature pad, this RN unable to do so. Attempted x3 to sign.

## 2021-05-30 NOTE — Telephone Encounter (Signed)
PT called to return the missed call 

## 2021-05-30 NOTE — ED Triage Notes (Signed)
Pt here for vaginal bleeding and to get hemoglobin checked. Pt was diagnosed with 17cm growth in uterus in [place] and has had vaginal bleeding since. Pt also diagnosed in September w/ DVT, is on xarelto and has had increase in vaginal bleeding, worsening over the last 5 days. Pt unable to get in w/ her PCP and obgyn until October.

## 2021-05-30 NOTE — Telephone Encounter (Signed)
Patient returned my call but I was unavailable. Attempted to call the patient back. No answer. Voicemail was full.

## 2021-05-30 NOTE — ED Provider Notes (Signed)
[facility] EMERGENCY DEPARTMENT Provider Note   CSN: [id number] Arrival date & time: 05/30/21  1116     History Chief Complaint  Patient presents with   Vaginal Bleeding   Dizziness    Robin Lopez is a 40 y.o. female.  The history is provided by the patient and medical records. No language interpreter was used.  Vaginal Bleeding Associated symptoms: dizziness   Dizziness  40 year old female significant history of acute left lower extremity DVT diagnosed on 9/14 currently on Xarelto, also history of an abnormal 17 cm uterine fibroid in the uterus diagnosed in August who is presenting with complaints of vaginal bleeding.  Patient reports since August she has had intermittent vaginal bleeding which is sporadic however for the past 5 days she has had persistent heavy vaginal bleeding going through 10-12 pantiliners a day.  She also report feeling lightheadedness and generalized weakness for the past few days.  She denies any significant pelvic pain or abdominal pain no fever chills.  She did develop some chest pain shortness of breath a week ago in which she was seen in the ED and had a negative chest CT angiogram for PE.  She is scheduled to follow-up with an OB/GYN on October 5.  She is here with concerns due to persistent vaginal bleeding while being on blood thinner medication.  She mention she has not been sexually active since for at least 9 months.  History reviewed. No pertinent past medical history.  Patient Active Problem List   Diagnosis Date Noted   Acute DVT of left tibial vein ([place]) 05/16/2021   Fibroids 05/16/2021    Past Surgical History:  Procedure Laterality Date   CESAREAN SECTION     x 2   CHOLECYSTECTOMY     TUBAL LIGATION     WISDOM TOOTH EXTRACTION       OB History     Gravida  3   Para  2   Term  2   Preterm      AB      Living  2      SAB      IAB      Ectopic      Multiple      Live Births               Family History  Problem Relation Age of Onset   Hypertension Father    CAD Father    Deep vein thrombosis Brother     Social History   Tobacco Use   Smoking status: Never   Smokeless tobacco: Never  Substance Use Topics   Alcohol use: No   Drug use: No    Home Medications Prior to Admission medications   Medication Sig Start Date End Date Taking? Authorizing Provider  albuterol (VENTOLIN HFA) 108 (90 Base) MCG/ACT inhaler Inhale 2 puffs into the lungs every 6 (six) hours as needed for wheezing or shortness of breath. Patient not taking: No sig reported 09/19/20   Chevis Pretty, FNP  metoprolol tartrate (LOPRESSOR) 100 MG tablet Take one tablet by mouth 2 hours prior to CT Patient not taking: No sig reported 07/06/20   Sueanne Margarita, MD  RIVAROXABAN [employer]) VTE STARTER PACK (15 & 20 MG) Follow package directions: Take one 15mg  tablet by mouth twice a day. On day 22, switch to one 20mg  tablet once a day. Take with food. 05/12/21   Redwine, Madison A, PA-C    Allergies    Prednisone  Review of Systems   Review of Systems  Genitourinary:  Positive for vaginal bleeding.  Neurological:  Positive for dizziness.  All other systems reviewed and are negative.  Physical Exam Updated Vital Signs BP (!) 150/106 (BP Location: Left Arm)   Pulse 97   Temp 98.7 F (37.1 C) (Oral)   Resp 15   Ht 5\' 6"  (1.676 m)   Wt 108 kg   SpO2 100%   BMI 38.43 kg/m   Physical Exam Vitals and nursing note reviewed.  Constitutional:      General: She is not in acute distress.    Appearance: She is well-developed. She is obese.     Comments: Appears anxious  HENT:     Head: Atraumatic.  Eyes:     Conjunctiva/sclera: Conjunctivae normal.  Cardiovascular:     Rate and Rhythm: Normal rate and regular rhythm.     Pulses: Normal pulses.     Heart sounds: Normal heart sounds.  Pulmonary:     Effort: Pulmonary effort is normal.  Abdominal:     Palpations: Abdomen is soft.      Tenderness: There is no abdominal tenderness.  Genitourinary:    Comments: Please refer to procedural note Musculoskeletal:     Cervical back: Neck supple.  Skin:    Findings: No rash.  Neurological:     Mental Status: She is alert.  Psychiatric:        Mood and Affect: Mood normal.    ED Results / Procedures / Treatments   Labs (all labs ordered are listed, but only abnormal results are displayed) Labs Reviewed  CBC WITH DIFFERENTIAL/PLATELET - Abnormal; Notable for the following components:      Result Value   WBC 14.6 (*)    RBC 3.51 (*)    Hemoglobin 9.9 (*)    HCT 29.8 (*)    Platelets 464 (*)    Neutro Abs 10.6 (*)    Abs Immature Granulocytes 0.13 (*)    All other components within normal limits  WET PREP, GENITAL  BASIC METABOLIC PANEL  URINALYSIS, ROUTINE W REFLEX MICROSCOPIC  I-STAT BETA HCG BLOOD, ED ([facility], [facility], [facility] ONLY)  TYPE AND SCREEN  ABO/RH  PREPARE RBC (CROSSMATCH)  GC/CHLAMYDIA PROBE AMP () NOT AT [facility]    EKG None  Radiology No results found.  Procedures Pelvic exam  Date/Time: 05/30/2021 12:40 PM Performed by: Domenic Moras, PA-C Authorized by: Domenic Moras, PA-C  Consent: Verbal consent obtained. Consent given by: patient Patient understanding: patient states understanding of the procedure being performed Patient identity confirmed: verbally with patient Comments: Lysbeth Galas, RN, available to chaperone.  No inguinal lymphadenopathy or inguinal hernia noted.  Normal external genitalia.  Mild discomfort with speculum insertion.  Pooling of blood noted in vaginal vault.  Close cervical os.  On bimanual examination no adnexal tenderness or cervical motion tenderness however exam is limited due to large body habitus.     Medications Ordered in ED Medications  0.9 %  sodium chloride infusion (has no administration in time range)    ED Course  I have reviewed the triage vital signs and the nursing notes.  Pertinent labs & imaging results  that were available during my care of the patient were reviewed by me and considered in my medical decision making (see chart for details).    MDM Rules/Calculators/A&P  BP 128/75   Pulse 84   Temp 98.7 F (37.1 C) (Oral)   Resp 17   Ht 5\' 6"  (1.676 m)   Wt 108 kg   SpO2 100%   BMI 38.43 kg/m   Final Clinical Impression(s) / ED Diagnoses Final diagnoses:  Abnormal vaginal bleeding    Rx / DC Orders ED Discharge Orders     None      12:42 PM Patient presents with ongoing vaginal bleeding for the past week.  Was diagnosed with a uterine mass/uterine fibroid in August.  It is measuring up to 17 cm.  She is scheduled to be seen by an OB/GYN on October 5.  She also recently diagnosed with left leg DVT 2 weeks ago and have been taking Xarelto.  Her increased bleeding likely exacerbated by being on blood thinner medication.  Patient does report feeling weak and lightheadedness with positional change.  Doing pelvic exam when patient sits up, heart rates rises greater than 100 bpm.  Work-up initiated.  Pelvic exam demonstrate moderate blood pooling in the vaginal vault.  Minimal clots noted.  1:49 PM Currently hemoglobin is 9.9.  Mild elevated white count of 14.6, similar to prior.  I have reached out and discussed care with on-call OB/GYN, Dr. Wilhelmenia Blase.   2:32 PM Dr. Wilhelmenia Blase have contacted with pt's PCP and now recommend blood transfusion.  Office will call her for f/u in 2 days to determine further plan.  Pt to continue with Xarelto in the mean time.  Pt is hemodynamically stable, she is amenable for blood transfusion.  She understand to return if her sxs worsen.  Care discussed with Dr. Roslynn Amble.   3:08 PM Pt sign out to oncoming team who will discharge pt once blood product is finished.  Pt is aware and agrees with plan.     Domenic Moras, PA-C 05/30/21 1508    Lucrezia Starch, MD 05/31/21 1051

## 2021-05-30 NOTE — Discharge Instructions (Signed)
You have been evaluated for vaginal bleeding, likely due to uterine fibroid and currently on blood thinner medication. You have received blood product during this visit.  The OBGYN office will contact you for close follow up, likely this Friday, for further management.  Do not hesitate to return if your symptoms worsen or if you have other concerns.

## 2021-05-31 LAB — GC/CHLAMYDIA PROBE AMP (~~LOC~~) NOT AT ARMC
Chlamydia: NEGATIVE
Comment: NEGATIVE
Comment: NORMAL
Neisseria Gonorrhea: NEGATIVE

## 2021-06-01 LAB — TYPE AND SCREEN
ABO/RH(D): A POS
Antibody Screen: NEGATIVE
Unit division: 0

## 2021-06-01 LAB — BPAM RBC
Blood Product Expiration Date: 202210292359
ISSUE DATE / TIME: 202209281549
Unit Type and Rh: 6200

## 2021-06-04 NOTE — Telephone Encounter (Signed)
Please advise. I do not see this paper work. Have you seen it?

## 2021-06-04 NOTE — Telephone Encounter (Signed)
Pt had this rechecked at the hospital on 05/30/2021. Nothing further needed.

## 2021-06-05 ENCOUNTER — Telehealth: Payer: Self-pay

## 2021-06-05 NOTE — Telephone Encounter (Signed)
-----   Message from [name], MD sent at 06/05/2021  8:54 AM EDT ----- Regarding: FW: Vaginal bleeding on Xarelto Please set up pt for 30 minute follow up .   We need to discuss possible change from Xarelto to Lovenox in anticipation of possible hysterectomy.   ----- Message ----- From: Rowland Lathe, MD Sent: 06/04/2021   3:29 PM EDT To: [name], MD Subject: Vaginal bleeding on Xarelto                    Good afternoon Dr. Elease Hashimoto,  Robin Lopez is scheduled to see me on Wednesday 10/5 for a surgical consult for her enlarged, likely fibroid, uterus. Reviewing her chart, I see that she has been seen in the ER for heavy vaginal bleeding since starting Xarelto for her DVT.  I will likely be able to offer her a laparoscopic hysterectomy. Typically, we delay these types of surgeries until at least 3-6 months after a VTE episode. Ideally, and if appropriate for her diagnosis, in the meantime she could decrease her Xarelto dose or switch to Lovenox (has lower rates of abnormal uterine bleeding) to decrease her bleeding and risk of anemia. If that could not be done, I may need to reach out to IR for a uterine artery embolization in order to temporize her bleeding in the meantime.  Thank you for your time - I wanted to reach out before her appointment on 10/5 in order to get your thoughts on her anticoagulation plans.   Irene Pap, MD [place] Can reach me on my cell phone if easier [phone]

## 2021-06-05 NOTE — Telephone Encounter (Signed)
ATC to set up an appointment. Unable to leave a message. "Call can not be completed at this time, please try again later"

## 2021-06-06 ENCOUNTER — Telehealth (INDEPENDENT_AMBULATORY_CARE_PROVIDER_SITE_OTHER): Payer: 59 | Admitting: Family Medicine

## 2021-06-06 DIAGNOSIS — D500 Iron deficiency anemia secondary to blood loss (chronic): Secondary | ICD-10-CM | POA: Diagnosis not present

## 2021-06-06 DIAGNOSIS — I82442 Acute embolism and thrombosis of left tibial vein: Secondary | ICD-10-CM

## 2021-06-06 DIAGNOSIS — D219 Benign neoplasm of connective and other soft tissue, unspecified: Secondary | ICD-10-CM | POA: Diagnosis not present

## 2021-06-06 NOTE — Progress Notes (Signed)
Patient ID: Robin Lopez, female   DOB: 1981-08-28, 40 y.o.   MRN: [id number]  This visit type was conducted due to national recommendations for restrictions regarding the COVID-19 pandemic in an effort to limit this patient's exposure and mitigate transmission in our community.   Virtual Visit via Video Note  I connected with Robin Lopez on 06/06/21 at  5:00 PM EDT by a video enabled telemedicine application and verified that I am speaking with the correct person using two identifiers.  Location patient: home Location provider:work or home office Persons participating in the virtual visit: patient, provider  I discussed the limitations of evaluation and management by telemedicine and the availability of in person appointments. The patient expressed understanding and agreed to proceed.   HPI:  Robin Lopez has large uterine fibroid with recent heavy vaginal bleeding.  She had recent COVID infection and was subsequently diagnosed with acute DVT lower extremity.  This was her first thrombotic event.  No evidence for pulmonary embolus.  Currently on Xarelto 20 mg daily.  Xarelto initiated 05/12/21.  She was placed recently on norethindrone 0.35 mg daily per gyn and her vaginal bleeding has slowed up some but still intermittently bleeding.  Recently had anemia requiring transfusion.  She talked to her gynecologist earlier today.  Their preference is to transition her to Lovenox with tentative plans to look at hysterectomy in about a couple of months.  She did take her last dose of Xarelto earlier today and is concerned about getting this covered by insurance for refills.  Has some fatigue but no major dizziness currently.    ROS: See pertinent positives and negatives per HPI.  No past medical history on file.  Past Surgical History:  Procedure Laterality Date   CESAREAN SECTION     x 2   CHOLECYSTECTOMY     TUBAL LIGATION     WISDOM TOOTH EXTRACTION      Family History  Problem Relation  Age of Onset   Hypertension Father    CAD Father    Deep vein thrombosis Brother     SOCIAL HX: Non-smoker   Current Outpatient Medications:    albuterol (VENTOLIN HFA) 108 (90 Base) MCG/ACT inhaler, Inhale 2 puffs into the lungs every 6 (six) hours as needed for wheezing or shortness of breath., Disp: 8 g, Rfl: 0   metoprolol tartrate (LOPRESSOR) 100 MG tablet, Take one tablet by mouth 2 hours prior to CT, Disp: 1 tablet, Rfl: 0   RIVAROXABAN (XARELTO) VTE STARTER PACK (15 & 20 MG), Follow package directions: Take one 15mg  tablet by mouth twice a day. On day 22, switch to one 20mg  tablet once a day. Take with food., Disp: 51 each, Rfl: 0   VIENVA 0.1-20 MG-MCG tablet, Take 1 tablet by mouth daily., Disp: , Rfl:   EXAM:  VITALS per patient if applicable:  GENERAL: alert, oriented, appears well and in no acute distress  HEENT: atraumatic, conjunttiva clear, no obvious abnormalities on inspection of external nose and ears  NECK: normal movements of the head and neck  LUNGS: on inspection no signs of respiratory distress, breathing rate appears normal, no obvious gross SOB, gasping or wheezing  CV: no obvious cyanosis  MS: moves all visible extremities without noticeable abnormality  PSYCH/NEURO: pleasant and cooperative, no obvious depression or anxiety, speech and thought processing grossly intact  ASSESSMENT AND PLAN:  Discussed the following assessment and plan:  #1 recent acute DVT.  She had risk factors of COVID infection, recent OCP use,  and prolonged travel of 8 hours in a car prior to her DVT.  Possible genetic risk (brother with hx of DVT)  No prior events.  Patient currently on Xarelto 20 mg daily -Provide samples of Xarelto 20 mg daily for the next week until we can get her transitioned over to Lovenox.  #2 large uterine fibroid with heavy vaginal bleeding intermittently.  Patient saw GYN earlier today with tentative plans to look at possible surgery in a couple of  months.  Their anti-coagulation preference is Lovenox in preparation for possible surgery.       I discussed the assessment and treatment plan with the patient. The patient was provided an opportunity to ask questions and all were answered. The patient agreed with the plan and demonstrated an understanding of the instructions.   The patient was advised to call back or seek an in-person evaluation if the symptoms worsen or if the condition fails to improve as anticipated.     Carolann Littler, MD

## 2021-06-06 NOTE — Telephone Encounter (Signed)
ATC, unable to leave a message.  

## 2021-06-06 NOTE — Telephone Encounter (Signed)
Patient has an appointment today

## 2021-06-07 MED ORDER — RIVAROXABAN 20 MG PO TABS: 20.0000 mg | ORAL_TABLET | Freq: Every day | ORAL | 0 refills | Status: DC

## 2021-06-12 ENCOUNTER — Telehealth: Payer: Self-pay

## 2021-06-12 DIAGNOSIS — I82442 Acute embolism and thrombosis of left tibial vein: Secondary | ICD-10-CM

## 2021-06-12 MED ORDER — ENOXAPARIN SODIUM 100 MG/ML IJ SOSY: 100.0000 mg | PREFILLED_SYRINGE | Freq: Two times a day (BID) | INTRAMUSCULAR | 11 refills | Status: DC

## 2021-06-12 NOTE — Telephone Encounter (Signed)
Prior conversations with Dr. Elease Hashimoto referring to pts plan of care are copied below.  Dr. Elease Hashimoto wrote, This patient recently dxed with acute DVT.   She has heavy uterine bleeding from fibroid and gyn would like for her to be transitioned from Xarelto to Lovenox in preparation for possible surgery in several weeks.   Have you assisted with dosing Lovenox and instructing them in dosing in past?   Let me know and we can call patient to set up if that is something you are comfortable with.   Thanks!   Dr. Elease Hashimoto and I discussed dosing of 100mg  BID and lab draws regularly.   Dr. Elease Hashimoto wrote, Sounds good.  Let us see if we can go ahead and get her approved to start Lovenox 100 mg twice daily with repeat CBC about 2 weeks after initiating.  She does have at least another week samples of Xarelto to take until we can see about transitioning to Lovenox.    Tried to contact pt but no answer and no VM. Call said try again later.  Left msg with pt's husband, Jeneen Rinks, for pt to return call.

## 2021-06-12 NOTE — Telephone Encounter (Signed)
Pt returned call. Advised pt of medication and education for medication. Pt was already familiar with medication dosage and how to administer. Pt would like to go ahead with treatment of lovenox Q12H, 100mg . Advised it can be costly and may need a PA. Advised there would be one week of syringes sent in so cost could be determined and if a PA is needed. Pt agreed to sending one week of syringes to [facility]. Advised this nurse will f/u concerning if the medication will need a PA. Pt verbalized understanding.   Sent in lovenox, Q12H, 100mg  for one week.  Will need to place orders and schedule pt for lab apt about 2 wks after she starts the lovenox.

## 2021-06-13 NOTE — Telephone Encounter (Signed)
Pt reports [facility] has ordered additional lovenox because they did not have the entire amount necessary for the first week.  Pt will pick the lovenox up this week and is ok with the cost of $40 per wk.  Scheduled pt for Monday at 1030 for lovenox injection education at [place]. Advised if anything is needed before apt to contact the office. Pt verbalized understanding

## 2021-06-13 NOTE — Telephone Encounter (Signed)
Pt LVM reporting the lovenox prescription was filled and no PA is needed and will cost $40 for the week. She states that is not to bad. She would like to schedule an apt for injection education for 10/17 at 9 or 10am.   Tried to contact pt but no answer and VM is full

## 2021-06-18 ENCOUNTER — Other Ambulatory Visit: Payer: Self-pay

## 2021-06-18 ENCOUNTER — Ambulatory Visit (INDEPENDENT_AMBULATORY_CARE_PROVIDER_SITE_OTHER): Payer: 59

## 2021-06-18 MED ORDER — ENOXAPARIN SODIUM 100 MG/ML IJ SOSY: 100.0000 mg | PREFILLED_SYRINGE | Freq: Two times a day (BID) | INTRAMUSCULAR | 0 refills | Status: DC

## 2021-06-18 NOTE — Progress Notes (Signed)
Pt in today for injection education for self injections of lovenox. Pt will d/c xarelto when advised and start lovenox, 100mg , Q12H to replace xarelto treatment until she can have hysterectomy with OB-GYN for fibroids.  The discussion about stopping xarelto and starting lovenox is documented in the 06/12/21 telephone encounter.  Pt has picked up the lovenox syringes and requests the next prescription be for a 30 day supply.  Educated pt on proper technique of lovenox self injection. 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  Gave pt an Customer service manager from the Lovenox ([employer] website with diagrams of self injection procedure. Advised of ER precautions and to contact office if signs or symptoms of bleeding. Pt reported she currently does not have any bleeding since starting hormonal treatment prescribed by her OB-GYN.  Answered all questions for pt and her daughter. Advised not to start lovenox until instructed by this nurse. Pt currently has enough xarelto for today and tomorrow. Pt would like to be contacted on the number [phone]. Advised if any questions during treatment with lovenox to contact this nurse. Pt has nurse phone number and verbalized understanding.

## 2021-06-18 NOTE — Telephone Encounter (Signed)
Pt was in today for injection education for lovenox which is documented in anticoag encounter.  Discussed with PCP and ok to start lovenox tomorrow and stop xarelto.   Contacted pt and advised to start lovenox tomorrow and stop xarelto. Advised a new prescription will be sent and if any questions or concerns to contact office. Pt verbalized understanding.   Sent in new script for lovenox for 30 day fill.  Scheduled pt for CBC on 10/31.

## 2021-06-19 NOTE — Telephone Encounter (Signed)
Tried to contact pt on number she provided at last visit, [phone] to inquire if she had any problem with her first lovenox injection this morning.  No answer and VM is full.

## 2021-06-21 ENCOUNTER — Encounter: Payer: Self-pay | Admitting: Family Medicine

## 2021-06-21 DIAGNOSIS — D500 Iron deficiency anemia secondary to blood loss (chronic): Secondary | ICD-10-CM | POA: Insufficient documentation

## 2021-06-27 ENCOUNTER — Telehealth: Payer: Self-pay

## 2021-07-02 ENCOUNTER — Other Ambulatory Visit: Payer: Self-pay

## 2021-07-02 ENCOUNTER — Other Ambulatory Visit (INDEPENDENT_AMBULATORY_CARE_PROVIDER_SITE_OTHER): Payer: 59

## 2021-07-02 DIAGNOSIS — I82442 Acute embolism and thrombosis of left tibial vein: Secondary | ICD-10-CM

## 2021-07-02 DIAGNOSIS — D649 Anemia, unspecified: Secondary | ICD-10-CM | POA: Diagnosis not present

## 2021-07-02 LAB — IBC + FERRITIN
Ferritin: 10.6 ng/mL (ref 10.0–291.0)
Iron: 20 ug/dL — ABNORMAL LOW (ref 42–145)
Saturation Ratios: 5.2 % — ABNORMAL LOW (ref 20.0–50.0)
TIBC: 386.4 ug/dL (ref 250.0–450.0)
Transferrin: 276 mg/dL (ref 212.0–360.0)

## 2021-07-02 LAB — CBC
HCT: 28.9 % — ABNORMAL LOW (ref 36.0–46.0)
Hemoglobin: 9.3 g/dL — ABNORMAL LOW (ref 12.0–15.0)
MCHC: 32.1 g/dL (ref 30.0–36.0)
MCV: 77.3 fl — ABNORMAL LOW (ref 78.0–100.0)
Platelets: 389 10*3/uL (ref 150.0–400.0)
RBC: 3.74 Mil/uL — ABNORMAL LOW (ref 3.87–5.11)
RDW: 16.3 % — ABNORMAL HIGH (ref 11.5–15.5)
WBC: 10.3 10*3/uL (ref 4.0–10.5)

## 2021-07-02 LAB — VITAMIN B12: Vitamin B-12: 250 pg/mL (ref 211–911)

## 2021-07-02 LAB — IRON: Iron: 20 ug/dL — ABNORMAL LOW (ref 42–145)

## 2021-07-02 NOTE — Telephone Encounter (Signed)
Please advise. I have added her start date to her paperwork. It has been placed back in your red folder.

## 2021-07-03 ENCOUNTER — Telehealth: Payer: Self-pay

## 2021-07-03 NOTE — Telephone Encounter (Signed)
Advised pt of PCP msg. Advised if anything further is needed to contact the office. Pt verbalized understanding. She reports she has talked to a nurse at [facility] and they are talking to the provider.

## 2021-07-03 NOTE — Telephone Encounter (Signed)
Pt called reporting she was concerned after seeing her lab results from yesterday. She is concerned about her HgB 9.3 and her RBC is 3.74 because she said when they were that low before she had to have a transfusion. Pt is c/o increased SOB but mostly at night and that she cannot lay on her back due to the SOB which started 3 nights ago. She denies SOB w/exertion. No audible sound of SOB or wheezing when talking with pt. No difficulty talking due to SOB.  Reports major bleeding has stopped completely but occasional light pink spots on the toilet paper when she wipes, nothing on her pad. Also c/o increased pelvic and back pressure.  Pt has not contacted OB-GYN concerning the symptoms. She is also wondering what is going to happen next in terms of her surgery. Advised to f/u with OB-GYN concerning the surgery and symptoms she is having. Pt requested the lab results be faxed to her OB-GYN, Dr. Irene Pap, phone number is [phone] and fax is [phone]. Faxed result to office.  Pt asked for her phone number to be updated to [phone]. This has been updated and pt asked that other number be removed, it has been removed. Advised a msg would be sent to PCP to update him with her symptoms. Advised if SOB worsens or she develops any new symptoms to contact the office. Advised of ER precautions. Pt verbalized understanding and is going to contact her OB-GYN.

## 2021-07-09 ENCOUNTER — Telehealth: Payer: Self-pay

## 2021-07-24 NOTE — Telephone Encounter (Signed)
Want to check in with pt to see how she is doing and if any updates on her surgery in the future.  LVM

## 2021-07-31 ENCOUNTER — Encounter: Payer: Self-pay | Admitting: Family Medicine

## 2021-08-03 ENCOUNTER — Ambulatory Visit (INDEPENDENT_AMBULATORY_CARE_PROVIDER_SITE_OTHER): Payer: 59 | Admitting: Family Medicine

## 2021-08-03 ENCOUNTER — Encounter: Payer: Self-pay | Admitting: Family Medicine

## 2021-08-03 VITALS — BP 126/80 | HR 76 | Temp 98.3°F | Ht 66.0 in | Wt 246.7 lb

## 2021-08-03 DIAGNOSIS — D500 Iron deficiency anemia secondary to blood loss (chronic): Secondary | ICD-10-CM | POA: Diagnosis not present

## 2021-08-03 DIAGNOSIS — I82442 Acute embolism and thrombosis of left tibial vein: Secondary | ICD-10-CM

## 2021-08-03 DIAGNOSIS — D219 Benign neoplasm of connective and other soft tissue, unspecified: Secondary | ICD-10-CM

## 2021-08-03 DIAGNOSIS — D509 Iron deficiency anemia, unspecified: Secondary | ICD-10-CM | POA: Diagnosis not present

## 2021-08-03 LAB — CBC WITH DIFFERENTIAL/PLATELET
Basophils Absolute: 0.1 10*3/uL (ref 0.0–0.1)
Basophils Relative: 0.9 % (ref 0.0–3.0)
Eosinophils Absolute: 0.2 10*3/uL (ref 0.0–0.7)
Eosinophils Relative: 2.7 % (ref 0.0–5.0)
HCT: 32.7 % — ABNORMAL LOW (ref 36.0–46.0)
Hemoglobin: 10.6 g/dL — ABNORMAL LOW (ref 12.0–15.0)
Lymphocytes Relative: 23 % (ref 12.0–46.0)
Lymphs Abs: 2.1 10*3/uL (ref 0.7–4.0)
MCHC: 32.4 g/dL (ref 30.0–36.0)
MCV: 74.7 fl — ABNORMAL LOW (ref 78.0–100.0)
Monocytes Absolute: 0.6 10*3/uL (ref 0.1–1.0)
Monocytes Relative: 6.7 % (ref 3.0–12.0)
Neutro Abs: 6.2 10*3/uL (ref 1.4–7.7)
Neutrophils Relative %: 66.7 % (ref 43.0–77.0)
Platelets: 411 10*3/uL — ABNORMAL HIGH (ref 150.0–400.0)
RBC: 4.38 Mil/uL (ref 3.87–5.11)
RDW: 17.3 % — ABNORMAL HIGH (ref 11.5–15.5)
WBC: 9.2 10*3/uL (ref 4.0–10.5)

## 2021-08-03 LAB — FERRITIN: Ferritin: 12.6 ng/mL (ref 10.0–291.0)

## 2021-08-03 MED ORDER — RIVAROXABAN 20 MG PO TABS: 20.0000 mg | ORAL_TABLET | Freq: Every day | ORAL | 4 refills | Status: DC

## 2021-08-03 NOTE — Patient Instructions (Signed)
I will order repeat venous doppler for January  Stop the Lovenox after today's dosing  Start the Xarelto 20 mg once daily starting tomorrow.

## 2021-08-03 NOTE — Progress Notes (Signed)
Established Patient Office Visit  Subjective:  Patient ID: Robin Lopez, female    DOB: [birth year]  Age: 40 y.o. MRN: [id number]  CC:  Chief Complaint  Patient presents with   Follow-up    HPI Robin Lopez presents for medical follow-up.  She had COVID-19 infection back in September.  She developed acute DVT left posterior tibial vein.  She went on anticoagulation but developed some fairly heavy vaginal bleeding.  No evidence for pulmonary embolus.  She has history of fibroids and her GYN had placed her on norethindrone 0.35 mg daily which has helped her vaginal bleeding but she still has some spotting.  Has not had follow-up hemoglobin since October.  Still feels fatigued at times.  She is taking iron replacement.  She consulted with her GYN earlier this week and they apparently did not feel comfortable doing her hysterectomy until around March.  Patient is currently on Lovenox.  She been transition from oral anticoagulation on Lovenox in anticipation of possible urgent surgery this fall.  At this point patient would prefer to go back on oral anticoagulation of possible..  No dyspnea or chest pain.  No leg pains.  History reviewed. No pertinent past medical history.  Past Surgical History:  Procedure Laterality Date   CESAREAN SECTION     x 2   CHOLECYSTECTOMY     TUBAL LIGATION     WISDOM TOOTH EXTRACTION      Family History  Problem Relation Age of Onset   Hypertension Father    CAD Father    Deep vein thrombosis Brother     Social History   Socioeconomic History   Marital status: Married    Spouse name: Not on file   Number of children: Not on file   Years of education: Not on file   Highest education level: Not on file  Occupational History   Not on file  Tobacco Use   Smoking status: Never   Smokeless tobacco: Never  Substance and Sexual Activity   Alcohol use: No   Drug use: No   Sexual activity: Yes    Birth control/protection: Surgical  Other  Topics Concern   Not on file  Social History Narrative   Not on file   Social Determinants of Health   Financial Resource Strain: Not on file  Food Insecurity: Not on file  Transportation Needs: Not on file  Physical Activity: Not on file  Stress: Not on file  Social Connections: Not on file  Intimate Partner Violence: Not on file    Outpatient Medications Prior to Visit  Medication Sig Dispense Refill   albuterol (VENTOLIN HFA) 108 (90 Base) MCG/ACT inhaler Inhale 2 puffs into the lungs every 6 (six) hours as needed for wheezing or shortness of breath. 8 g 0   metoprolol tartrate (LOPRESSOR) 100 MG tablet Take one tablet by mouth 2 hours prior to CT 1 tablet 0   VIENVA 0.1-20 MG-MCG tablet Take 1 tablet by mouth daily.     enoxaparin (LOVENOX) 100 MG/ML injection Inject 1 mL (100 mg total) into the skin every 12 (twelve) hours. DO NOT START UNTIL DIRECTED BY CLINIC NURSE 30 mL 0   rivaroxaban (XARELTO) 20 MG TABS tablet Take 1 tablet (20 mg total) by mouth daily with supper. 14 tablet 0   RIVAROXABAN (XARELTO) VTE STARTER PACK (15 & 20 MG) Follow package directions: Take one 15mg  tablet by mouth twice a day. On day 22, switch to one 20mg  tablet once a day.  Take with food. 51 each 0   No facility-administered medications prior to visit.    Allergies  Allergen Reactions   Prednisone Itching and Rash    ROS Review of Systems  Constitutional:  Negative for chills and fever.  Respiratory:  Negative for shortness of breath.   Cardiovascular:  Negative for chest pain.     Objective:    Physical Exam Vitals reviewed.  Constitutional:      Appearance: Normal appearance.  Cardiovascular:     Rate and Rhythm: Normal rate and regular rhythm.  Pulmonary:     Effort: Pulmonary effort is normal.     Breath sounds: Normal breath sounds. No wheezing or rales.  Neurological:     Mental Status: She is alert.    BP 126/80 (BP Location: Left Arm, Patient Position: Sitting, Cuff  Size: Normal)   Pulse 76   Temp 98.3 F (36.8 C) (Oral)   Ht 5\' 6"  (1.676 m)   Wt 246 lb 11.2 oz (111.9 kg)   SpO2 96%   BMI 39.82 kg/m  Wt Readings from Last 3 Encounters:  08/03/21 246 lb 11.2 oz (111.9 kg)  05/30/21 238 lb 1.6 oz (108 kg)  05/24/21 240 lb (108.9 kg)     Health Maintenance Due  Topic Date Due   COVID-19 Vaccine (1) Never done   HIV Screening  Never done   Hepatitis C Screening  Never done   TETANUS/TDAP  Never done   INFLUENZA VACCINE  Never done    There are no preventive care reminders to display for this patient.  Lab Results  Component Value Date   TSH 2.898 06/05/2018   Lab Results  Component Value Date   WBC 10.3 07/02/2021   HGB 9.3 (L) 07/02/2021   HCT 28.9 (L) 07/02/2021   MCV 77.3 (L) 07/02/2021   PLT 389.0 07/02/2021   Lab Results  Component Value Date   NA 136 05/30/2021   K 4.4 05/30/2021   CO2 23 05/30/2021   GLUCOSE 96 05/30/2021   BUN 11 05/30/2021   CREATININE 0.80 05/30/2021   BILITOT 0.4 05/11/2021   ALKPHOS 133 (H) 05/11/2021   AST 17 05/11/2021   ALT 16 05/11/2021   PROT 7.3 05/11/2021   ALBUMIN 3.8 05/11/2021   CALCIUM 9.0 05/30/2021   ANIONGAP 7 05/30/2021   No results found for: CHOL No results found for: HDL No results found for: LDLCALC No results found for: TRIG No results found for: CHOLHDL No results found for: HGBA1C    Assessment & Plan:   #1 recent acute DVT left posterior tibial vein following COVID infection.  Patient currently on Lovenox.  Looks like she would not get GYN surgery for hysterectomy until March likely at this point.  We discussed transitioning back to Xarelto 20 mg daily and patient would be interested in doing so. We also discussed getting follow-up venous Doppler lower extremity maybe sometime in January prior to her surgery  #2 iron deficiency anemia related to menorrhagia. -Recheck CBC and ferritin -Continue iron replacement.   Meds ordered this encounter  Medications    DISCONTD: rivaroxaban (XARELTO) 20 MG TABS tablet    Sig: Take 1 tablet (20 mg total) by mouth daily with supper.    Dispense:  30 tablet    Refill:  4    Lot: [id number] Exp: 10/2023   DISCONTD: rivaroxaban (XARELTO) 20 MG TABS tablet    Sig: Take 1 tablet (20 mg total) by mouth daily with supper.  Dispense:  30 tablet    Refill:  4    Lot: [id number] Exp: 10/2023   rivaroxaban (XARELTO) 20 MG TABS tablet    Sig: Take 1 tablet (20 mg total) by mouth daily with supper.    Dispense:  30 tablet    Refill:  4    Follow-up: No follow-ups on file.    Carolann Littler, MD

## 2021-08-06 ENCOUNTER — Encounter: Payer: Self-pay | Admitting: Family Medicine

## 2021-08-07 NOTE — Telephone Encounter (Signed)
Per PCP office visit last week, pt has been transitioned back to xarelto.

## 2021-08-13 ENCOUNTER — Encounter: Payer: Self-pay | Admitting: Family Medicine

## 2021-09-06 ENCOUNTER — Encounter (HOSPITAL_COMMUNITY): Payer: Self-pay | Admitting: Emergency Medicine

## 2021-09-06 ENCOUNTER — Ambulatory Visit (HOSPITAL_COMMUNITY)
Admission: RE | Admit: 2021-09-06 | Payer: 59 | Source: Ambulatory Visit | Attending: Family Medicine | Admitting: Family Medicine

## 2021-09-06 ENCOUNTER — Emergency Department (HOSPITAL_COMMUNITY)
Admission: EM | Admit: 2021-09-06 | Discharge: 2021-09-07 | Disposition: A | Discharge status: Home / Self Care | Payer: 59 | Attending: Emergency Medicine | Admitting: Emergency Medicine

## 2021-09-06 ENCOUNTER — Other Ambulatory Visit: Payer: Self-pay

## 2021-09-06 DIAGNOSIS — I10 Essential (primary) hypertension: Secondary | ICD-10-CM | POA: Insufficient documentation

## 2021-09-06 DIAGNOSIS — K922 Gastrointestinal hemorrhage, unspecified: Secondary | ICD-10-CM

## 2021-09-06 DIAGNOSIS — N9489 Other specified conditions associated with female genital organs and menstrual cycle: Secondary | ICD-10-CM | POA: Diagnosis not present

## 2021-09-06 DIAGNOSIS — K921 Melena: Secondary | ICD-10-CM | POA: Diagnosis not present

## 2021-09-06 DIAGNOSIS — Z7901 Long term (current) use of anticoagulants: Secondary | ICD-10-CM | POA: Diagnosis not present

## 2021-09-06 LAB — CBC WITH DIFFERENTIAL/PLATELET
Abs Immature Granulocytes: 0.11 10*3/uL — ABNORMAL HIGH (ref 0.00–0.07)
Basophils Absolute: 0.1 10*3/uL (ref 0.0–0.1)
Basophils Relative: 1 %
Eosinophils Absolute: 0.2 10*3/uL (ref 0.0–0.5)
Eosinophils Relative: 2 %
HCT: 34.6 % — ABNORMAL LOW (ref 36.0–46.0)
Hemoglobin: 10.8 g/dL — ABNORMAL LOW (ref 12.0–15.0)
Immature Granulocytes: 1 %
Lymphocytes Relative: 19 %
Lymphs Abs: 2.9 10*3/uL (ref 0.7–4.0)
MCH: 23.5 pg — ABNORMAL LOW (ref 26.0–34.0)
MCHC: 31.2 g/dL (ref 30.0–36.0)
MCV: 75.4 fL — ABNORMAL LOW (ref 80.0–100.0)
Monocytes Absolute: 1 10*3/uL (ref 0.1–1.0)
Monocytes Relative: 6 %
Neutro Abs: 11 10*3/uL — ABNORMAL HIGH (ref 1.7–7.7)
Neutrophils Relative %: 71 %
Platelets: 414 10*3/uL — ABNORMAL HIGH (ref 150–400)
RBC: 4.59 MIL/uL (ref 3.87–5.11)
RDW: 17.9 % — ABNORMAL HIGH (ref 11.5–15.5)
WBC: 15.3 10*3/uL — ABNORMAL HIGH (ref 4.0–10.5)
nRBC: 0 % (ref 0.0–0.2)

## 2021-09-06 LAB — I-STAT BETA HCG BLOOD, ED (MC, WL, AP ONLY): I-stat hCG, quantitative: <5 m[IU]/mL (ref <5)

## 2021-09-06 LAB — TYPE AND SCREEN
ABO/RH(D): A POS
Antibody Screen: NEGATIVE

## 2021-09-06 LAB — PROTIME-INR
INR: 2 — ABNORMAL HIGH (ref 0.8–1.2)
Prothrombin Time: 22.8 seconds — ABNORMAL HIGH (ref 11.4–15.2)

## 2021-09-06 NOTE — ED Triage Notes (Signed)
Pt C/O abdominal pain and blood in her stool. Pt reports taking xarelto since September for DVT. Pt reports lightheadedness and dizziness.

## 2021-09-06 NOTE — ED Provider Triage Note (Signed)
Emergency Medicine Provider Triage Evaluation Note  Robin Lopez , a 41 y.o. female  was evaluated in triage.  Pt complains of blood in stool.  Patient states that Tuesday she began having bright red blood that turned to burgundy colored blood in her bowel movements.  She states that she had less blood in her stools on Wednesday and has since not had any.  She does state that she has started to have left lower quadrant abdominal pain that is intermittent and sharp.  She states that she takes Xarelto for DVT that was diagnosed in September.  She states that she also has uterine fibroids that bleed and so is on estrogen for that.  She states that she has had some spotting from her vagina.  Denies hematuria.  She does endorse lightheadedness and dizziness.  Denies palpitations or chest pain or shortness of breath.  Review of Systems  Positive: See above Negative:   Physical Exam  BP (!) 142/99 (BP Location: Right Arm)    Pulse 94    Temp 97.8 F (36.6 C)    Resp 15    SpO2 97%  Gen:   Awake, no distress   Resp:  Normal effort  MSK:   Moves extremities without difficulty  Other:  Abdomen is rounded, soft.  Tenderness to palpation of the left lower quadrant.  Medical Decision Making  Medically screening exam initiated at 4:11 PM.  Appropriate orders placed.  Lakeisa Heninger was informed that the remainder of the evaluation will be completed by another provider, this initial triage assessment does not replace that evaluation, and the importance of remaining in the ED until their evaluation is complete.     Mickie Hillier, PA-C 09/06/21 [phone]

## 2021-09-07 ENCOUNTER — Emergency Department (HOSPITAL_COMMUNITY): Payer: 59

## 2021-09-07 LAB — URINALYSIS, ROUTINE W REFLEX MICROSCOPIC
Bacteria, UA: NONE SEEN
Bilirubin Urine: NEGATIVE
Glucose, UA: NEGATIVE mg/dL
Ketones, ur: NEGATIVE mg/dL
Leukocytes,Ua: NEGATIVE
Nitrite: NEGATIVE
Protein, ur: NEGATIVE mg/dL
Specific Gravity, Urine: 1.025 (ref 1.005–1.030)
pH: 5 (ref 5.0–8.0)

## 2021-09-07 LAB — LIPASE, BLOOD: Lipase: 41 U/L (ref 11–51)

## 2021-09-07 LAB — COMPREHENSIVE METABOLIC PANEL
ALT: 19 U/L (ref 0–44)
AST: 23 U/L (ref 15–41)
Albumin: 4 g/dL (ref 3.5–5.0)
Alkaline Phosphatase: 151 U/L — ABNORMAL HIGH (ref 38–126)
Anion gap: 10 (ref 5–15)
BUN: 15 mg/dL (ref 6–20)
CO2: 18 mmol/L — ABNORMAL LOW (ref 22–32)
Calcium: 8.9 mg/dL (ref 8.9–10.3)
Chloride: 103 mmol/L (ref 98–111)
Creatinine, Ser: 0.84 mg/dL (ref 0.44–1.00)
GFR, Estimated: >60 mL/min (ref >60)
Glucose, Bld: 120 mg/dL — ABNORMAL HIGH (ref 70–99)
Potassium: 3.7 mmol/L (ref 3.5–5.1)
Sodium: 131 mmol/L — ABNORMAL LOW (ref 135–145)
Total Bilirubin: 0.5 mg/dL (ref 0.3–1.2)
Total Protein: 7.4 g/dL (ref 6.5–8.1)

## 2021-09-07 MED ORDER — IOHEXOL 300 MG/ML  SOLN
100.0000 mL | Freq: Once | INTRAMUSCULAR | Status: AC | PRN
  Administered 2021-09-07: 100 mL via INTRAVENOUS

## 2021-09-07 NOTE — ED Provider Notes (Signed)
[facility] EMERGENCY DEPARTMENT Provider Note   CSN: [id number] Arrival date & time: 09/06/21  1413     History  Chief Complaint  Patient presents with   Melena    Robin Lopez is a 41 y.o. female.  HPI  41 year old female with past medical history of DVT anticoagulated on Xarelto, large uterine fibroid, HTN, tubal ligation presents to the emergency department with concern for blood in stool.  Patient states that she has chronic left-sided abdominal pain secondary to her fibroid.  Couple days ago she noticed bright red blood entwined in her stool with multiple bowel movements, she also had some independent bleeding in the toilet bowl that she described as maroon blood.  No clots.  A lot of blood when she wiped.  She states that this subsided however about a day ago the pain returned and she had another small amount of blood in the stool.  This concerned her as she is on anticoagulation.  No history of IBS or recent colonoscopy.  Patient denies any symptoms anemia including chest pain, shortness of breath, lightheadedness.  She states that she has been compliant with her Xarelto.  No genitourinary complaints.  Home Medications Prior to Admission medications   Medication Sig Start Date End Date Taking? Authorizing Provider  albuterol (VENTOLIN HFA) 108 (90 Base) MCG/ACT inhaler Inhale 2 puffs into the lungs every 6 (six) hours as needed for wheezing or shortness of breath. 09/19/20  Yes Martin, Mary-Margaret, FNP  Cyanocobalamin (VITAMIN B-12) 5000 MCG TBDP Take 5,000 mcg by mouth daily.   Yes [provider]  ferrous sulfate 324 MG TBEC Take 324 mg by mouth daily with breakfast.   Yes [provider]  norethindrone (MICRONOR) 0.35 MG tablet Take 1 tablet by mouth daily. 06/01/21  Yes [provider]  rivaroxaban (XARELTO) 20 MG TABS tablet Take 1 tablet (20 mg total) by mouth daily with supper. 08/03/21  Yes Burchette, Alinda Sierras, MD  metoprolol  tartrate (LOPRESSOR) 100 MG tablet Take one tablet by mouth 2 hours prior to CT Patient not taking: Reported on 09/07/2021 07/06/20   Sueanne Margarita, MD      Allergies    Prednisone    Review of Systems   Review of Systems  Constitutional:  Positive for appetite change and fatigue. Negative for chills and fever.  HENT:  Negative for congestion.   Eyes:  Negative for visual disturbance.  Respiratory:  Negative for shortness of breath.   Cardiovascular:  Negative for chest pain.  Gastrointestinal:  Positive for abdominal pain and blood in stool. Negative for abdominal distention, diarrhea, nausea and vomiting.  Genitourinary:  Negative for dysuria, flank pain, vaginal bleeding and vaginal discharge.  Musculoskeletal:  Negative for back pain.  Skin:  Negative for rash.  Neurological:  Negative for headaches.   Physical Exam Updated Vital Signs BP 117/76    Pulse 74    Temp 97.7 F (36.5 C)    Resp 17    SpO2 98%  Physical Exam Vitals and nursing note reviewed.  Constitutional:      Appearance: Normal appearance.  HENT:     Head: Normocephalic.     Mouth/Throat:     Mouth: Mucous membranes are moist.  Cardiovascular:     Rate and Rhythm: Normal rate.  Pulmonary:     Effort: Pulmonary effort is normal. No respiratory distress.  Abdominal:     General: There is no distension.     Palpations: Abdomen is soft.  Tenderness: There is abdominal tenderness. There is no guarding or rebound.     Comments: Left mid abdominal tenderness to palpation without any guarding  Musculoskeletal:     Comments: Chronic edema in the bilateral lower extremities, worse on the left which is consistent with DVT diagnosis, + palpable peripheral pulses  Skin:    General: Skin is warm.  Neurological:     Mental Status: She is alert and oriented to person, place, and time. Mental status is at baseline.  Psychiatric:        Mood and Affect: Mood normal.    ED Results / Procedures / Treatments    Labs (all labs ordered are listed, but only abnormal results are displayed) Labs Reviewed  CBC WITH DIFFERENTIAL/PLATELET - Abnormal; Notable for the following components:      Result Value   WBC 15.3 (*)    Hemoglobin 10.8 (*)    HCT 34.6 (*)    MCV 75.4 (*)    MCH 23.5 (*)    RDW 17.9 (*)    Platelets 414 (*)    Neutro Abs 11.0 (*)    Abs Immature Granulocytes 0.11 (*)    All other components within normal limits  PROTIME-INR - Abnormal; Notable for the following components:   Prothrombin Time 22.8 (*)    INR 2.0 (*)    All other components within normal limits  URINALYSIS, ROUTINE W REFLEX MICROSCOPIC - Abnormal; Notable for the following components:   APPearance HAZY (*)    Hgb urine dipstick SMALL (*)    All other components within normal limits  COMPREHENSIVE METABOLIC PANEL - Abnormal; Notable for the following components:   Sodium 131 (*)    CO2 18 (*)    Glucose, Bld 120 (*)    Alkaline Phosphatase 151 (*)    All other components within normal limits  LIPASE, BLOOD  I-STAT BETA HCG BLOOD, ED ([facility], [facility], [facility] ONLY)  TYPE AND SCREEN    EKG None  Radiology CT Abdomen Pelvis W Contrast  Result Date: 09/07/2021 CLINICAL DATA:  Left lower quadrant abdominal pain. EXAM: CT ABDOMEN AND PELVIS WITH CONTRAST TECHNIQUE: Multidetector CT imaging of the abdomen and pelvis was performed using the standard protocol following bolus administration of intravenous contrast. CONTRAST:  139mL OMNIPAQUE IOHEXOL 300 MG/ML  SOLN COMPARISON:  CT 04/16/2021 FINDINGS: Lower chest: No acute findings. No focal airspace disease or pleural effusion. Hepatobiliary: No focal liver abnormality is seen. The liver is enlarged spanning 19.3 cm cranial caudal. Status post cholecystectomy. No biliary dilatation. Pancreas: No ductal dilatation or inflammation. Spleen: Mildly enlarged spanning 13.9 cm AP. No focal abnormality. Incidental splenule at the hilum. Adrenals/Urinary Tract: Normal adrenal glands. No  hydronephrosis or perinephric edema. Homogeneous renal enhancement with symmetric excretion on delayed phase imaging. No renal calculi. 15 mm cyst in the mid anterior left kidney. Urinary bladder nondistended and not well evaluated. Stomach/Bowel: Unremarkable stomach. No small bowel obstruction or inflammation. Slight fecalization of distal small bowel contents. Normal appendix. Submucosal fatty infiltration of the ascending colon. Small volume of colonic stool. No colonic wall thickening or inflammation. Vascular/Lymphatic: Normal caliber abdominal aorta. Patent portal vein. No acute vascular findings. Circumaortic left renal vein. Prominent central mesenteric nodes measure up to 10 mm short axis. These are also seen on prior exam, likely reactive. Reproductive: Heterogeneously enlarged uterus spanning 17 cm cranial caudal, uterine fundus at the level of the umbilicus. Multiple uterine fibroids. High-riding left ovary, normal by CT appearance. Right ovary is not definitively  seen. No adnexal mass. Other: No free air or ascites. Tiny periumbilical ventral abdominal wall hernias containing fat. Musculoskeletal: There are no acute or suspicious osseous abnormalities. Incidental hemangioma within T9 vertebral body. IMPRESSION: 1. No acute abnormality in the abdomen/pelvis. 2. Enlarged fibroid uterus. 3. Mild hepatosplenomegaly. Electronically Signed   By: Keith Rake M.D.   On: 09/07/2021 01:52    Procedures Procedures    Medications Ordered in ED Medications  iohexol (OMNIPAQUE) 300 MG/ML solution 100 mL (100 mLs Intravenous Contrast Given 09/07/21 0138)    ED Course/ Medical Decision Making/ A&P                           Medical Decision Making   This patient presents to the ED for concern of rectal bleeding, this involves an extensive number of treatment options, and is a complaint that carries with it a high risk of complications and morbidity.  The differential diagnosis includes GI bleed,  anemia, infection, obstruction, diverticulosis/diverticulitis   Additional history obtained: -External records from outside source obtained and reviewed including: Chart review including previous notes, labs, imaging, consultation notes   Lab Tests: -I ordered, reviewed, and interpreted labs.  The pertinent results include: Stable hemoglobin, fecal occult inconclusive with no obtain stool   Imaging Studies ordered: -I ordered imaging studies including CT of the abdomen pelvis -I independently visualized and interpreted imaging which showed known uterine fibroid without any other acute findings -I agree with the radiologist interpretation   ED Course: 41 year old female presents emergency department with concern for GI bleed while being on anticoagulation.  Patient has been diagnosed with a leg DVT of which she is been compliant with Xarelto 4.  Now complaining of 2 isolated episodes of bright red blood/maroon bleeding with bowel movements.  No independent rectal bleeding.  No symptoms of anemia.  She has chronic abdominal pain that she feels may be more persistent in the past couple days.  Her vitals are stable, abdomen is benign, rectal reveals no stool or active bleeding.  Blood work is reassuring from an anemia standpoint, no symptoms of anemia.  CT confirms uterine fibroid without any other concerning findings.  Recommended that the patient remains on Xarelto with outpatient GI follow-up with potential colonoscopy.   Cardiac Monitoring: The patient was maintained on a cardiac monitor.  I personally viewed and interpreted the cardiac monitored which showed an underlying rhythm of: NSR   Reevaluation: After the interventions noted above, I reevaluated the patient and found that they have :stayed the same   Dispostion: Patient at this time appears safe and stable for discharge and close outpatient follow up with GI evaluation. Discharge plan and strict return to ED precautions  discussed, patient verbalizes understanding and agreement.        Final Clinical Impression(s) / ED Diagnoses Final diagnoses:  None    Rx / DC Orders ED Discharge Orders     None         Lorelle Gibbs, DO 09/07/21 1254

## 2021-09-07 NOTE — Discharge Instructions (Signed)
You have been seen and discharged from the emergency department. Your blood work and CT scan look normal, however you need to follow up with GI for further eval and possible colonoscopy. Follow-up with your primary provider for further evaluation and further care. Take home medications as prescribed. If you have any worsening symptoms or further concerns for your health please return to an emergency department for further evaluation.

## 2021-09-11 ENCOUNTER — Ambulatory Visit (INDEPENDENT_AMBULATORY_CARE_PROVIDER_SITE_OTHER): Payer: 59 | Admitting: Family Medicine

## 2021-09-11 VITALS — BP 130/60 | HR 80 | Temp 98.5°F | Wt 254.5 lb

## 2021-09-11 DIAGNOSIS — K921 Melena: Secondary | ICD-10-CM

## 2021-09-11 DIAGNOSIS — Z9229 Personal history of other drug therapy: Secondary | ICD-10-CM

## 2021-09-11 DIAGNOSIS — R635 Abnormal weight gain: Secondary | ICD-10-CM | POA: Diagnosis not present

## 2021-09-11 NOTE — Progress Notes (Signed)
Established Patient Office Visit  Subjective:  Patient ID: Robin Lopez, female    DOB: 01/03/1981  Age: 41 y.o. MRN: [id number]  CC:  Chief Complaint  Patient presents with   Hospitalization Follow-up    HPI [name] presents for recent ER follow-up.  Her past history is that she has history of uterine fibroids and has had menometrorrhagia saw GYN several months ago was placed on hormonal therapy which has slowed down her menstrual loss somewhat.  She did have fairly heavy bleed again recently.  She had COVID several months ago and subsequent DVT and was placed at that time on Xarelto.  Her plan is to get hysterectomy in March.  She recently noticed some bright red blood a week ago in her stool both Tuesday and Wednesday.  She went to the ER Thursday.  There is no associated pain.  No recent straining.  She noted a lot of blood with wiping and also a fair amount in the toilet bowl.  She had follow-up labs and hemoglobin remained stable at 10.8.  She was told to get follow-up for colonoscopy.  No family history of colon cancer.  Patient has actually had some recent weight gain.  No appetite loss.  Has not noted any bright red blood per rectum since her ER visit.  CT abdomen pelvis in the ER showed no acute abnormality.  She had enlarged fibroid uterus and mild hepatosplenomegaly.  She had some chronic fatigue.  Denies any dizziness.  Remains on Xarelto 20 mg daily  No past medical history on file.  Past Surgical History:  Procedure Laterality Date   CESAREAN SECTION     x 2   CHOLECYSTECTOMY     TUBAL LIGATION     WISDOM TOOTH EXTRACTION      Family History  Problem Relation Age of Onset   Hypertension Father    CAD Father    Deep vein thrombosis Brother     Social History   Socioeconomic History   Marital status: Divorced    Spouse name: Not on file   Number of children: Not on file   Years of education: Not on file   Highest education level: Not on file   Occupational History   Not on file  Tobacco Use   Smoking status: Never   Smokeless tobacco: Never  Substance and Sexual Activity   Alcohol use: No   Drug use: No   Sexual activity: Yes    Birth control/protection: Surgical  Other Topics Concern   Not on file  Social History Narrative   Not on file   Social Determinants of Health   Financial Resource Strain: Not on file  Food Insecurity: Not on file  Transportation Needs: Not on file  Physical Activity: Not on file  Stress: Not on file  Social Connections: Not on file  Intimate Partner Violence: Not on file    Outpatient Medications Prior to Visit  Medication Sig Dispense Refill   albuterol (VENTOLIN HFA) 108 (90 Base) MCG/ACT inhaler Inhale 2 puffs into the lungs every 6 (six) hours as needed for wheezing or shortness of breath. 8 g 0   Cyanocobalamin (VITAMIN B-12) 5000 MCG TBDP Take 5,000 mcg by mouth daily.     ferrous sulfate 324 MG TBEC Take 324 mg by mouth daily with breakfast.     metoprolol tartrate (LOPRESSOR) 100 MG tablet Take one tablet by mouth 2 hours prior to CT 1 tablet 0   norethindrone (MICRONOR) 0.35 MG tablet  Take 1 tablet by mouth daily.     rivaroxaban (XARELTO) 20 MG TABS tablet Take 1 tablet (20 mg total) by mouth daily with supper. 30 tablet 4   No facility-administered medications prior to visit.    Allergies  Allergen Reactions   Prednisone Itching and Rash    ROS Review of Systems  Constitutional:  Negative for chills and fever.  Respiratory:  Negative for shortness of breath.   Cardiovascular:  Negative for chest pain.  Gastrointestinal:  Positive for blood in stool. Negative for abdominal pain, nausea and vomiting.  Genitourinary:  Negative for dysuria and hematuria.  Neurological:  Negative for dizziness and syncope.     Objective:    Physical Exam Vitals reviewed.  Constitutional:      Appearance: Normal appearance.  Cardiovascular:     Rate and Rhythm: Normal rate and  regular rhythm.  Pulmonary:     Effort: Pulmonary effort is normal.     Breath sounds: Normal breath sounds.  Abdominal:     Palpations: Abdomen is soft. There is no mass.     Tenderness: There is no abdominal tenderness. There is no guarding or rebound.  Neurological:     Mental Status: She is alert.    BP 130/60 (BP Location: Left Arm, Patient Position: Sitting, Cuff Size: Normal)    Pulse 80    Temp 98.5 F (36.9 C) (Oral)    Wt 254 lb 8 oz (115.4 kg)    SpO2 99%    BMI 41.08 kg/m  Wt Readings from Last 3 Encounters:  09/11/21 254 lb 8 oz (115.4 kg)  08/03/21 246 lb 11.2 oz (111.9 kg)  05/30/21 238 lb 1.6 oz (108 kg)     Health Maintenance Due  Topic Date Due   COVID-19 Vaccine (1) Never done   HIV Screening  Never done   Hepatitis C Screening  Never done   TETANUS/TDAP  Never done   INFLUENZA VACCINE  Never done    There are no preventive care reminders to display for this patient.  Lab Results  Component Value Date   TSH 2.898 06/05/2018   Lab Results  Component Value Date   WBC 15.3 (H) 09/06/2021   HGB 10.8 (L) 09/06/2021   HCT 34.6 (L) 09/06/2021   MCV 75.4 (L) 09/06/2021   PLT 414 (H) 09/06/2021   Lab Results  Component Value Date   NA 131 (L) 09/07/2021   K 3.7 09/07/2021   CO2 18 (L) 09/07/2021   GLUCOSE 120 (H) 09/07/2021   BUN 15 09/07/2021   CREATININE 0.84 09/07/2021   BILITOT 0.5 09/07/2021   ALKPHOS 151 (H) 09/07/2021   AST 23 09/07/2021   ALT 19 09/07/2021   PROT 7.4 09/07/2021   ALBUMIN 4.0 09/07/2021   CALCIUM 8.9 09/07/2021   ANIONGAP 10 09/07/2021   No results found for: CHOL No results found for: HDL No results found for: LDLCALC No results found for: TRIG No results found for: CHOLHDL No results found for: HGBA1C    Assessment & Plan:   #1 hematochezia-none now for the past week.  Recent hemoglobin stable.  Patient on Xarelto.  We discussed common sources such as internal hemorrhoids.  She does not have any pain to suggest  likely anal fissure.  CT confirmed uterine fibroid without any other acute findings. -Set up GI referral for consideration for colonoscopy. -Follow-up immediately for any dizziness, recurrent rectal bleeding, or other concerns  #2 weight gain.  Patient plans to start lower  carbohydrate dietary plan soon.  Hopefully after she gets her uterine fibroid surgery she can start becoming more active with exercise.  #3 history of acute DVT following COVID several months ago.  Patient remains on Xarelto and plans are to keep her on this until her hysterectomy in March   No orders of the defined types were placed in this encounter.   Follow-up: No follow-ups on file.    Carolann Littler, MD

## 2021-09-14 ENCOUNTER — Other Ambulatory Visit: Payer: Self-pay

## 2021-09-14 ENCOUNTER — Ambulatory Visit (HOSPITAL_COMMUNITY)
Admission: RE | Admit: 2021-09-14 | Discharge: 2021-09-14 | Disposition: A | Discharge status: Home / Self Care | Payer: 59 | Source: Ambulatory Visit | Attending: Cardiology | Admitting: Cardiology

## 2021-09-14 DIAGNOSIS — I82442 Acute embolism and thrombosis of left tibial vein: Secondary | ICD-10-CM | POA: Diagnosis present

## 2021-09-17 ENCOUNTER — Ambulatory Visit: Payer: 59 | Admitting: Physician Assistant

## 2021-09-18 ENCOUNTER — Ambulatory Visit: Payer: 59 | Admitting: Physician Assistant

## 2021-09-18 ENCOUNTER — Encounter: Payer: Self-pay | Admitting: Family Medicine

## 2021-09-26 ENCOUNTER — Ambulatory Visit: Payer: 59 | Admitting: Physician Assistant

## 2021-10-12 ENCOUNTER — Ambulatory Visit (INDEPENDENT_AMBULATORY_CARE_PROVIDER_SITE_OTHER): Payer: 59 | Admitting: Physician Assistant

## 2021-10-12 ENCOUNTER — Encounter: Payer: Self-pay | Admitting: Physician Assistant

## 2021-10-12 VITALS — BP 118/80 | HR 91 | Ht 66.0 in | Wt 249.0 lb

## 2021-10-12 DIAGNOSIS — K921 Melena: Secondary | ICD-10-CM | POA: Diagnosis not present

## 2021-10-12 DIAGNOSIS — Z7901 Long term (current) use of anticoagulants: Secondary | ICD-10-CM

## 2021-10-12 DIAGNOSIS — K219 Gastro-esophageal reflux disease without esophagitis: Secondary | ICD-10-CM | POA: Diagnosis not present

## 2021-10-12 DIAGNOSIS — D509 Iron deficiency anemia, unspecified: Secondary | ICD-10-CM

## 2021-10-12 DIAGNOSIS — I82442 Acute embolism and thrombosis of left tibial vein: Secondary | ICD-10-CM | POA: Diagnosis not present

## 2021-10-12 MED ORDER — FAMOTIDINE 40 MG PO TABS: 40.0000 mg | ORAL_TABLET | Freq: Every day | ORAL | 5 refills | Status: DC

## 2021-10-12 NOTE — Patient Instructions (Signed)
MEDICATION: We have sent the following medication to your pharmacy for you to pick up at your convenience: Pepcid 40 MG tablet, take once a day.  We will contact you for an appointment for end of May.  BMI:  If you are age 41 or older, your body mass index should be between 23-30. Your Body mass index is 40.19 kg/m. If this is out of the aforementioned range listed, please consider follow up with your Primary Care Provider.  If you are age 15 or younger, your body mass index should be between 19-25. Your Body mass index is 40.19 kg/m. If this is out of the aformentioned range listed, please consider follow up with your Primary Care Provider.   MY CHART:  The [facility] providers would like to encourage you to use [facility] to communicate with providers for non-urgent requests or questions.  Due to long hold times on the telephone, sending your provider a message by [facility] may be a faster and more efficient way to get a response.  Please allow 48 business hours for a response.  Please remember that this is for non-urgent requests.   Thank you for trusting me with your gastrointestinal care!    Ellouise Newer, [place]

## 2021-10-12 NOTE — Progress Notes (Signed)
Agree with assessment and plan as outlined.  Sounds like significant menorrhagia might be the more likely cause of her IDA but she definitely warrants endoscopic evaluation especially in light of her rectal bleeding.  Patient is having hysterectomy in the near future, we will plan on procedures when she is recovered from that per her preference unless she wants to do beforehand, we can accommodate her.  Closely monitor hemoglobin while on anticoagulation.

## 2021-10-12 NOTE — Progress Notes (Signed)
Chief Complaint: Hematochezia  HPI:    Robin Lopez is a 41 year old female with a past medical history as listed below including DVT on Xarelto, who was referred to me by [name], MD for a complaint of hematochezia.    09/06/2021 patient seen in the ED for melena.  Patient describes chronic left-sided abdominal pain secondary to fibroids and a couple days ago had noticed some bright red blood entwined in her stool with multiple bowel movements also some maroon blood.    09/07/2021 CT of the abdomen pelvis with contrast for left lower quadrant pain with no acute abnormality and enlarged fibroid uterus as well as mild hepatosplenomegaly.    09/07/2021 CMP with decreased sodium at 131 and minimally elevated alk phos at 151.  Lipase normal.  INR elevated at 2.0.  CBC with a hemoglobin of 10.8 (seems to be around this over the past 4 months).  White count elevated at 15.3.  Platelets elevated at 414.    09/11/2021 patient saw her PCP.  At that time discussed she had menorrhagia and saw GYN several months ago and placed on hormonal therapy which is slowed down her menstrual loss somewhat but she had recently had a fairly heavy bleed.  Also had COVID several months ago and subsequent DVT and was placed on Xarelto.  Plans are for hysterectomy in March, had noted some bright red blood in her stool couple days the week before.  Follow-up labs showed stable hemoglobin at 10.8.    Today, the patient tells me that she has had a lot going on.  She has been anemic since at least September of last year and in fact required a blood transfusion around that timeframe.  She has had very heavy menses thought related to a very large fibroid that she has.  Also with chronic left-sided pain due to this.  Her gynecologist has been following this and there are plans for hysterectomy in March.  Patient tells me that she was also started on Xarelto a few months ago after having COVID and developing a DVT.  Tells me that there  are plans to continue the Xarelto until after time of her hysterectomy as their thoughts are that possibly her fibroids etc. had increased her risk for clot instead of just the COVID.  Also tells me her son is graduating from college in May.    As far as GI symptoms patient tells me she has had some bright red blood in her stool on various occasions over the past couple of years.  In fact a few years back she had a lot of diarrhea and bleeding almost every time she would have a bowel movement but had no insurance so she did not get it checked out, most recently her stools have returned to more normal but she will still occasionally see some bright red blood as she did prior to seeing the ED as above.  She has not had any bleeding since then.  Tells me it just kind of comes and goes maybe once every couple of months.    Also with chronic reflux symptoms.  Apparently she was told she cannot be on medicine due to her blood thinner.    Denies fever, chills, weight loss, direct family history of colon cancer or symptoms that awaken her from sleep.  Medical History: IDA Fibroid uterus  Past Surgical History:  Procedure Laterality Date   CESAREAN SECTION     x 2   CHOLECYSTECTOMY  TUBAL LIGATION     WISDOM TOOTH EXTRACTION      Current Outpatient Medications  Medication Sig Dispense Refill   Cyanocobalamin (VITAMIN B-12) 5000 MCG TBDP Take 5,000 mcg by mouth daily.     famotidine (PEPCID) 40 MG tablet Take 1 tablet (40 mg total) by mouth daily. 30 tablet 5   ferrous sulfate 324 MG TBEC Take 324 mg by mouth daily with breakfast.     norethindrone (MICRONOR) 0.35 MG tablet Take 1 tablet by mouth daily.     rivaroxaban (XARELTO) 20 MG TABS tablet Take 1 tablet (20 mg total) by mouth daily with supper. 30 tablet 4   metoprolol tartrate (LOPRESSOR) 100 MG tablet Take one tablet by mouth 2 hours prior to CT (Patient not taking: Reported on 10/12/2021) 1 tablet 0   No current facility-administered  medications for this visit.    Allergies as of 10/12/2021 - Review Complete 10/12/2021  Allergen Reaction Noted   Prednisone Itching and Rash 02/26/2013    Family History  Problem Relation Age of Onset   Hypertension Father    CAD Father    Deep vein thrombosis Brother     Social History   Socioeconomic History   Marital status: Divorced    Spouse name: Not on file   Number of children: Not on file   Years of education: Not on file   Highest education level: Not on file  Occupational History   Not on file  Tobacco Use   Smoking status: Never   Smokeless tobacco: Never  Substance and Sexual Activity   Alcohol use: No   Drug use: No   Sexual activity: Yes    Birth control/protection: Surgical  Other Topics Concern   Not on file  Social History Narrative   Not on file   Social Determinants of Health   Financial Resource Strain: Not on file  Food Insecurity: Not on file  Transportation Needs: Not on file  Physical Activity: Not on file  Stress: Not on file  Social Connections: Not on file  Intimate Partner Violence: Not on file    Review of Systems:    Constitutional: No weight loss, fever or chills Skin: No rash  Cardiovascular: No chest pain Respiratory: No SOB  Gastrointestinal: See HPI and otherwise negative Genitourinary: No dysuria  Neurological: No headache, dizziness or syncope Musculoskeletal: No new muscle or joint pain Hematologic: No bruising Psychiatric: No history of depression or anxiety   Physical Exam:  Vital signs: BP 118/80    Pulse 91    Ht '5\' 6"'  (1.676 m)    Wt 249 lb (112.9 kg)    SpO2 98%    BMI 40.19 kg/m   Constitutional:   Pleasant overweight Caucasian female appears to be in NAD, Well developed, Well nourished, alert and cooperative Head:  Normocephalic and atraumatic. Eyes:   PEERL, EOMI. No icterus. Conjunctiva pink. Ears:  Normal auditory acuity. Neck:  Supple Throat: Oral cavity and pharynx without inflammation,  swelling or lesion.  Respiratory: Respirations even and unlabored. Lungs clear to auscultation bilaterally.   No wheezes, crackles, or rhonchi.  Cardiovascular: Normal S1, S2. No MRG. Regular rate and rhythm. No peripheral edema, cyanosis or pallor.  Gastrointestinal:  Soft, nondistended, mild LLQ ttp, No rebound or guarding. Normal bowel sounds. No appreciable masses or hepatomegaly. Rectal:  Declined Msk:  Symmetrical without gross deformities. Without edema, no deformity or joint abnormality.  Neurologic:  Alert and  oriented x4;  grossly normal neurologically.  Skin:  Dry and intact without significant lesions or rashes. Psychiatric: Demonstrates good judgement and reason without abnormal affect or behaviors.  RELEVANT LABS AND IMAGING: CBC    Component Value Date/Time   WBC 15.3 (H) 09/06/2021 1413   RBC 4.59 09/06/2021 1413   HGB 10.8 (L) 09/06/2021 1413   HGB 12.9 02/16/2013 1328   HCT 34.6 (L) 09/06/2021 1413   HCT 37.4 02/16/2013 1328   PLT 414 (H) 09/06/2021 1413   PLT 406 02/16/2013 1328   MCV 75.4 (L) 09/06/2021 1413   MCV 81 02/16/2013 1328   MCH 23.5 (L) 09/06/2021 1413   MCHC 31.2 09/06/2021 1413   RDW 17.9 (H) 09/06/2021 1413   RDW 15.3 (H) 02/16/2013 1328   LYMPHSABS 2.9 09/06/2021 1413   LYMPHSABS 3.2 10/06/2012 1348   MONOABS 1.0 09/06/2021 1413   MONOABS 0.6 10/06/2012 1348   EOSABS 0.2 09/06/2021 1413   EOSABS 0.2 10/06/2012 1348   BASOSABS 0.1 09/06/2021 1413   BASOSABS 0.1 10/06/2012 1348    CMP     Component Value Date/Time   NA 131 (L) 09/07/2021 0042   NA 141 07/06/2020 1514   NA 138 10/06/2012 1348   K 3.7 09/07/2021 0042   K 4.1 10/06/2012 1348   CL 103 09/07/2021 0042   CL 106 10/06/2012 1348   CO2 18 (L) 09/07/2021 0042   CO2 28 10/06/2012 1348   GLUCOSE 120 (H) 09/07/2021 0042   GLUCOSE 76 10/06/2012 1348   BUN 15 09/07/2021 0042   BUN 13 07/06/2020 1514   BUN 11 10/06/2012 1348   CREATININE 0.84 09/07/2021 0042   CREATININE 0.75  10/06/2012 1348   CALCIUM 8.9 09/07/2021 0042   CALCIUM 8.8 10/06/2012 1348   PROT 7.4 09/07/2021 0042   ALBUMIN 4.0 09/07/2021 0042   AST 23 09/07/2021 0042   ALT 19 09/07/2021 0042   ALKPHOS 151 (H) 09/07/2021 0042   BILITOT 0.5 09/07/2021 0042   GFRNONAA >60 09/07/2021 0042   GFRNONAA >60 10/06/2012 1348   GFRAA 88 07/06/2020 1514   GFRAA >60 10/06/2012 1348    Assessment: 1.  Hematochezia: Multiple episodes over the past few years, consider relation to hemorrhoids most likely versus IBD versus other 2.  IDA: Chronic for the patient since August/September of last year, thought most likely related to heavy menses as below with hysterectomy planned due to fibroid uterus as below 3.  Fibroid uterus: Hysterectomy planned for March 4.  History of DVT on Xarelto  Plan: 1.  Discussed with patient that I do think she should have a colonoscopy as well as EGD for further evaluation of iron deficiency anemia, GERD and recent hematochezia.  I think we should wait on this though as most likely her iron deficiency anemia is caused by heavy manses and this will likely be resolved after her hysterectomy which is planned for March timeframe.  Patient has a lot going on with her son graduating etc. and these GI symptoms are not new.  As long as someone is following her hemoglobin, which I believe her PCP is doing then she should be okay until end of April/May timeframe. 2.  Patient will be placed in recall for follow-up appointment around end of April/May so that we can further discuss scheduling EGD and colonoscopy and make sure she is doing okay otherwise.  She may also be able to be off of her Xarelto at that time which would be helpful. 3.  Started the patient on Pepcid 40 mg daily #30 with  5 refills.  This can be increased to twice daily dosing if necessary 4.  Patient will call if she has any worsening of symptoms or questions/complaints. Assigned to Dr. Havery Moros this morning.  Ellouise Newer,  PA-C [facility] 10/12/2021, 8:52 AM  Cc: [name], MD

## 2021-11-07 ENCOUNTER — Emergency Department (HOSPITAL_COMMUNITY)
Admission: EM | Admit: 2021-11-07 | Discharge: 2021-11-07 | Disposition: A | Discharge status: Home / Self Care | Payer: 59 | Attending: Emergency Medicine | Admitting: Emergency Medicine

## 2021-11-07 ENCOUNTER — Emergency Department (HOSPITAL_COMMUNITY): Payer: 59

## 2021-11-07 ENCOUNTER — Emergency Department (HOSPITAL_BASED_OUTPATIENT_CLINIC_OR_DEPARTMENT_OTHER): Payer: 59

## 2021-11-07 DIAGNOSIS — N1330 Unspecified hydronephrosis: Secondary | ICD-10-CM | POA: Insufficient documentation

## 2021-11-07 DIAGNOSIS — D649 Anemia, unspecified: Secondary | ICD-10-CM | POA: Insufficient documentation

## 2021-11-07 DIAGNOSIS — R0789 Other chest pain: Secondary | ICD-10-CM | POA: Insufficient documentation

## 2021-11-07 DIAGNOSIS — I10 Essential (primary) hypertension: Secondary | ICD-10-CM | POA: Diagnosis not present

## 2021-11-07 DIAGNOSIS — Z7901 Long term (current) use of anticoagulants: Secondary | ICD-10-CM | POA: Insufficient documentation

## 2021-11-07 DIAGNOSIS — M79652 Pain in left thigh: Secondary | ICD-10-CM

## 2021-11-07 DIAGNOSIS — M79605 Pain in left leg: Secondary | ICD-10-CM | POA: Diagnosis present

## 2021-11-07 DIAGNOSIS — L03116 Cellulitis of left lower limb: Secondary | ICD-10-CM | POA: Insufficient documentation

## 2021-11-07 DIAGNOSIS — Z20822 Contact with and (suspected) exposure to covid-19: Secondary | ICD-10-CM | POA: Diagnosis not present

## 2021-11-07 DIAGNOSIS — D72829 Elevated white blood cell count, unspecified: Secondary | ICD-10-CM | POA: Diagnosis not present

## 2021-11-07 DIAGNOSIS — Z79899 Other long term (current) drug therapy: Secondary | ICD-10-CM | POA: Diagnosis not present

## 2021-11-07 DIAGNOSIS — J069 Acute upper respiratory infection, unspecified: Secondary | ICD-10-CM | POA: Diagnosis not present

## 2021-11-07 LAB — BASIC METABOLIC PANEL
Anion gap: 9 (ref 5–15)
BUN: 11 mg/dL (ref 6–20)
CO2: 22 mmol/L (ref 22–32)
Calcium: 8.7 mg/dL — ABNORMAL LOW (ref 8.9–10.3)
Chloride: 105 mmol/L (ref 98–111)
Creatinine, Ser: 0.77 mg/dL (ref 0.44–1.00)
GFR, Estimated: >60 mL/min (ref >60)
Glucose, Bld: 164 mg/dL — ABNORMAL HIGH (ref 70–99)
Potassium: 4.1 mmol/L (ref 3.5–5.1)
Sodium: 136 mmol/L (ref 135–145)

## 2021-11-07 LAB — CBC WITH DIFFERENTIAL/PLATELET
Abs Immature Granulocytes: 0.27 10*3/uL — ABNORMAL HIGH (ref 0.00–0.07)
Basophils Absolute: 0.1 10*3/uL (ref 0.0–0.1)
Basophils Relative: 0 %
Eosinophils Absolute: 0.2 10*3/uL (ref 0.0–0.5)
Eosinophils Relative: 1 %
HCT: 31.9 % — ABNORMAL LOW (ref 36.0–46.0)
Hemoglobin: 10.1 g/dL — ABNORMAL LOW (ref 12.0–15.0)
Immature Granulocytes: 2 %
Lymphocytes Relative: 15 %
Lymphs Abs: 2.4 10*3/uL (ref 0.7–4.0)
MCH: 23.4 pg — ABNORMAL LOW (ref 26.0–34.0)
MCHC: 31.7 g/dL (ref 30.0–36.0)
MCV: 73.8 fL — ABNORMAL LOW (ref 80.0–100.0)
Monocytes Absolute: 0.8 10*3/uL (ref 0.1–1.0)
Monocytes Relative: 5 %
Neutro Abs: 12.3 10*3/uL — ABNORMAL HIGH (ref 1.7–7.7)
Neutrophils Relative %: 77 %
Platelets: 405 10*3/uL — ABNORMAL HIGH (ref 150–400)
RBC: 4.32 MIL/uL (ref 3.87–5.11)
RDW: 16.7 % — ABNORMAL HIGH (ref 11.5–15.5)
WBC: 16.1 10*3/uL — ABNORMAL HIGH (ref 4.0–10.5)
nRBC: 0 % (ref 0.0–0.2)

## 2021-11-07 LAB — TROPONIN I (HIGH SENSITIVITY)
Troponin I (High Sensitivity): 6 ng/L (ref <18)
Troponin I (High Sensitivity): 7 ng/L (ref <18)

## 2021-11-07 LAB — RESP PANEL BY RT-PCR (FLU A&B, COVID) ARPGX2
Influenza A by PCR: NEGATIVE
Influenza B by PCR: NEGATIVE
SARS Coronavirus 2 by RT PCR: NEGATIVE

## 2021-11-07 LAB — I-STAT BETA HCG BLOOD, ED (MC, WL, AP ONLY): I-stat hCG, quantitative: <5 m[IU]/mL (ref <5)

## 2021-11-07 LAB — CK: Total CK: 50 U/L (ref 38–234)

## 2021-11-07 MED ORDER — HYDROCODONE-ACETAMINOPHEN 5-325 MG PO TABS
2.0000 | ORAL_TABLET | ORAL | 0 refills | Status: DC | PRN
Start: 2021-11-07 — End: 2021-11-12

## 2021-11-07 MED ORDER — HYDROMORPHONE HCL 1 MG/ML IJ SOLN
1.0000 mg | Freq: Once | INTRAMUSCULAR | Status: AC
  Administered 2021-11-07: 1 mg via INTRAVENOUS
  Filled 2021-11-07: qty 1

## 2021-11-07 MED ORDER — IOHEXOL 350 MG/ML SOLN
100.0000 mL | Freq: Once | INTRAVENOUS | Status: AC | PRN
  Administered 2021-11-07: 100 mL via INTRAVENOUS

## 2021-11-07 MED ORDER — CEPHALEXIN 500 MG PO CAPS: 500.0000 mg | ORAL_CAPSULE | Freq: Four times a day (QID) | ORAL | 0 refills | Status: DC

## 2021-11-07 NOTE — ED Triage Notes (Signed)
Pt. Stated, ive had left leg upper groin area pain for 3 days. Its like the same pain I had when I had the other clot in the same leg except in my calf. ?

## 2021-11-07 NOTE — ED Notes (Signed)
The pt has pain in her lt groin for one week  she had a clot in her lt calf in September  she is on eliquis   bi-lateral pedal pulses present strong. Some bi-lateral leg swelling ?

## 2021-11-07 NOTE — ED Notes (Signed)
Pt discharged but cannot leave yet just had dilaudid '1mg'$  iv ?

## 2021-11-07 NOTE — Progress Notes (Signed)
Lower extremity venous has been completed.  ? ?Preliminary results in CV Proc.  ? ?Robin Lopez Barney Russomanno ?11/07/2021 1:12 PM    ?

## 2021-11-07 NOTE — ED Provider Notes (Signed)
[facility] EMERGENCY DEPARTMENT Provider Note   CSN: [id number] Arrival date & time: 11/07/21  1030     History  Chief Complaint  Patient presents with   Leg Pain    Robin Lopez is a 41 y.o. female.   Leg Pain  41 year old female presenting to the emergency department with a history of DVT in her left calf on Xarelto who presents emergency department with left leg pain.  She states that she has had worsening chest congestion, productive cough of yellow sputum, shortness of breath with some chest tightness.  She denies any history of PE.  She has not missed any doses of her Xarelto.  She endorses cramping in her left thigh that feels similar to when she had her DVT in her left calf back in September.  She feels that she has been coming down with a viral illness with her upper respiratory symptoms.  She denies any fevers or chills.  Home Medications Prior to Admission medications   Medication Sig Start Date End Date Taking? Authorizing Provider  cephALEXin (KEFLEX) 500 MG capsule Take 1 capsule (500 mg total) by mouth 4 (four) times daily. 11/07/21  Yes Regan Lemming, MD  HYDROcodone-acetaminophen (NORCO/VICODIN) 5-325 MG tablet Take 2 tablets by mouth every 4 (four) hours as needed. 11/07/21  Yes Regan Lemming, MD  Cyanocobalamin (VITAMIN B-12) 5000 MCG TBDP Take 5,000 mcg by mouth daily.    [provider]  famotidine (PEPCID) 40 MG tablet Take 1 tablet (40 mg total) by mouth daily. 10/12/21   Levin Erp, PA  ferrous sulfate 324 MG TBEC Take 324 mg by mouth daily with breakfast.    [provider]  metoprolol tartrate (LOPRESSOR) 100 MG tablet Take one tablet by mouth 2 hours prior to CT Patient not taking: Reported on 10/12/2021 07/06/20   Sueanne Margarita, MD  norethindrone (MICRONOR) 0.35 MG tablet Take 1 tablet by mouth daily. 06/01/21   [provider]  rivaroxaban (XARELTO) 20 MG TABS tablet Take 1 tablet (20 mg total) by  mouth daily with supper. 08/03/21   Burchette, Alinda Sierras, MD      Allergies    Prednisone    Review of Systems   Review of Systems  Respiratory:  Positive for cough, chest tightness and shortness of breath.   Musculoskeletal:  Positive for myalgias.  All other systems reviewed and are negative.  Physical Exam Updated Vital Signs BP 124/73    Pulse 88    Temp 98.2 F (36.8 C) (Oral)    Resp 16    LMP 10/09/2021    SpO2 98%  Physical Exam Vitals and nursing note reviewed.  Constitutional:      General: She is not in acute distress.    Appearance: She is well-developed.  HENT:     Head: Normocephalic and atraumatic.  Eyes:     Conjunctiva/sclera: Conjunctivae normal.  Cardiovascular:     Rate and Rhythm: Normal rate and regular rhythm.     Pulses: Normal pulses.     Heart sounds: No murmur heard.    Comments: 2+ DP pulses bilaterally Pulmonary:     Effort: Pulmonary effort is normal. No respiratory distress.     Breath sounds: Normal breath sounds.  Abdominal:     Palpations: Abdomen is soft.     Tenderness: There is no abdominal tenderness.  Musculoskeletal:        General: Tenderness present. No swelling.     Cervical back: Neck supple.  Comments: Tenderness to palpation of the proximal anterior left thigh and groin without evidence of cellulitis or abscess, pain with range of motion  Skin:    General: Skin is warm and dry.     Capillary Refill: Capillary refill takes less than 2 seconds.  Neurological:     Mental Status: She is alert.  Psychiatric:        Mood and Affect: Mood normal.    ED Results / Procedures / Treatments   Labs (all labs ordered are listed, but only abnormal results are displayed) Labs Reviewed  BASIC METABOLIC PANEL - Abnormal; Notable for the following components:      Result Value   Glucose, Bld 164 (*)    Calcium 8.7 (*)    All other components within normal limits  CBC WITH DIFFERENTIAL/PLATELET - Abnormal; Notable for the following  components:   WBC 16.1 (*)    Hemoglobin 10.1 (*)    HCT 31.9 (*)    MCV 73.8 (*)    MCH 23.4 (*)    RDW 16.7 (*)    Platelets 405 (*)    Neutro Abs 12.3 (*)    Abs Immature Granulocytes 0.27 (*)    All other components within normal limits  RESP PANEL BY RT-PCR (FLU A&B, COVID) ARPGX2  CK  I-STAT BETA HCG BLOOD, ED ([facility], [facility], [facility] ONLY)  TROPONIN I (HIGH SENSITIVITY)  TROPONIN I (HIGH SENSITIVITY)    EKG None  Radiology CT Angio Chest PE W and/or Wo Contrast  Result Date: 11/07/2021 CLINICAL DATA:  Pulmonary embolism (PE) suspected, high prob Chest pain.  History of DVT. EXAM: CT ANGIOGRAPHY CHEST WITH CONTRAST TECHNIQUE: Multidetector CT imaging of the chest was performed using the standard protocol during bolus administration of intravenous contrast. Multiplanar CT image reconstructions and MIPs were obtained to evaluate the vascular anatomy. RADIATION DOSE REDUCTION: This exam was performed according to the departmental dose-optimization program which includes automated exposure control, adjustment of the mA and/or kV according to patient size and/or use of iterative reconstruction technique. CONTRAST:  119m OMNIPAQUE IOHEXOL 350 MG/ML SOLN COMPARISON:  Chest CTA 05/24/2021 FINDINGS: Cardiovascular: There are no filling defects within the pulmonary arteries to suggest pulmonary embolus. The thoracic aorta is normal in caliber. No evidence of aortic dissection or acute aortic findings. Conventional branching pattern from the aortic arch. The heart is normal in size. There is no pericardial effusion. Mediastinum/Nodes: No enlarged mediastinal or hilar lymph nodes. The esophagus is decompressed. Heterogeneous left lobe of the thyroid gland, no discrete nodule is demonstrated on the current exam. Lungs/Pleura: Heterogeneous pulmonary parenchyma. No focal or confluent airspace disease. No pleural effusion. Mild central bronchial thickening. No pulmonary nodule or mass. Upper Abdomen: Symmetric  bilateral mild hydronephrosis. Cholecystectomy. Hepatosplenomegaly which was seen on prior abdominopelvic CT 09/07/2021. Musculoskeletal: There are no acute or suspicious osseous abnormalities. Occasional Schmorl's nodes in the thoracic spine. No chest wall soft tissue abnormalities. Review of the MIP images confirms the above findings. IMPRESSION: 1. No pulmonary embolus. 2. Heterogeneous pulmonary parenchyma with mild central bronchial thickening, can be seen with small airways disease. 3. Symmetric bilateral mild hydronephrosis in the upper abdomen, of unknown etiology. Recommend correlation for any urinary symptoms. Electronically Signed   By: MKeith RakeM.D.   On: 11/07/2021 17:44   CT FEMUR LEFT W CONTRAST  Result Date: 11/07/2021 CLINICAL DATA:  Soft tissue infection suspected of the left thigh. Left leg pain. History of DVT diagnosed 05/2021 with repeat ultrasound 08/2021 demonstrating clot had  resolved. EXAM: CT OF THE LOWER RIGHT EXTREMITY WITH CONTRAST TECHNIQUE: Multidetector CT imaging of the lower right extremity was performed according to the standard protocol following intravenous contrast administration. RADIATION DOSE REDUCTION: This exam was performed according to the departmental dose-optimization program which includes automated exposure control, adjustment of the mA and/or kV according to patient size and/or use of iterative reconstruction technique. CONTRAST:  142m OMNIPAQUE IOHEXOL 350 MG/ML SOLN COMPARISON:  CT abdomen and pelvis 04/16/2021 FINDINGS: Bones/Joint/Cartilage Mild-to-moderate joint space narrowing and subchondral sclerosis degenerative change of the pubic symphysis. Mild bilateral sacroiliac joint degenerative vacuum phenomenon. No acute fracture. No cortical erosion is seen, with attention to the left femur. Ligaments Suboptimally assessed by CT. Muscles and Tendons Normal density and size of the left thigh musculature. Soft tissues Incidental note of superficial  subcutaneous fat varicose veins. There is mild-to-moderate diffuse lateral and posterolateral left thigh subcutaneous fat edema and swelling. No walled-off fluid collection or abscess is seen. The uterus is partially visualized and again markedly enlarged. IMPRESSION:: IMPRESSION: 1. Mild-to-moderate diffuse lateral and posterolateral left thigh subcutaneous fat edema and swelling. No walled-off fluid collection or abscess. 2. No cortical erosion. Electronically Signed   By: RYvonne KendallM.D.   On: 11/07/2021 17:42   VAS [place]OWER EXTREMITY VENOUS (DVT) (7a-7p)  Result Date: 11/07/2021  Lower Venous DVT Study Patient Name:  JDEKOTA KIRLIN Date of Exam:   11/07/2021 Medical Rec #: [id number]       Accession #:    [id number] of Birth: 8Apr 22, 1982       Patient Gender: F Patient Age:   89years [facility]      V[facility] [place]OWER EXTREMITY VENOUS (DVT) Referring Phys: HINA KHATRI --------------------------------------------------------------------------------  Indications: Pain.  Comparison Study: prior 05/12/21 Performing Technologist: MArchie PattenRVS  Examination Guidelines: A complete evaluation includes B-mode imaging, spectral Doppler, color Doppler, and power Doppler as needed of all accessible portions of each vessel. Bilateral testing is considered an integral part of a complete examination. Limited examinations for reoccurring indications may be performed as noted. The reflux portion of the exam is performed with the patient in reverse Trendelenburg.  +-----+---------------+---------+-----------+----------+--------------+  RIGHT Compressibility Phasicity Spontaneity Properties Thrombus Aging  +-----+---------------+---------+-----------+----------+--------------+  CFV   Full            Yes       Yes                                    +-----+---------------+---------+-----------+----------+--------------+    +---------+---------------+---------+-----------+----------+---------------+  LEFT      Compressibility Phasicity Spontaneity Properties Thrombus Aging   +---------+---------------+---------+-----------+----------+---------------+  CFV       Full            Yes       Yes                                     +---------+---------------+---------+-----------+----------+---------------+  SFJ       Full                                                              +---------+---------------+---------+-----------+----------+---------------+  FV Prox   Full                                                              +---------+---------------+---------+-----------+----------+---------------+  FV Mid    Full                                                              +---------+---------------+---------+-----------+----------+---------------+  FV Distal Full                                                              +---------+---------------+---------+-----------+----------+---------------+  PFV       Full                                                              +---------+---------------+---------+-----------+----------+---------------+  POP       Full            Yes       Yes                                     +---------+---------------+---------+-----------+----------+---------------+  PTV       Full                                                              +---------+---------------+---------+-----------+----------+---------------+  PERO                                                       patent by color  +---------+---------------+---------+-----------+----------+---------------+    Summary: RIGHT: - No evidence of common femoral vein obstruction.  LEFT: - There is no evidence of deep vein thrombosis in the lower extremity.  - No cystic structure found in the popliteal fossa.  *See table(s) above for measurements and observations. Electronically signed by Monica Martinez MD on 11/07/2021 at 1:57:15 PM.     Final     Procedures Procedures    Medications Ordered in ED Medications  HYDROmorphone (DILAUDID) injection 1 mg (1 mg Intravenous Given 11/07/21 1743)  iohexol (OMNIPAQUE) 350 MG/ML injection 100 mL (100 mLs Intravenous Contrast Given 11/07/21 5809)    ED Course/ Medical Decision Making/ A&P Clinical Course as of 11/07/21 1937  Wed Nov 07, 2021  1627 WBC(!): 16.1 [JL]  Clinical Course User Index [JL] Regan Lemming, MD                           Medical Decision Making Amount and/or Complexity of Data Reviewed Labs: ordered. Decision-making details documented in ED Course. Radiology: ordered.  Risk Prescription drug management.   41 year old female presenting to the emergency department with a history of DVT in her left calf on Xarelto who presents emergency department with left leg pain.  She states that she has had worsening chest congestion, productive cough of yellow sputum, shortness of breath with some chest tightness.  She denies any history of PE.  She has not missed any doses of her Xarelto.  She endorses cramping in her left thigh that feels similar to when she had her DVT in her left calf back in September.  She feels that she has been coming down with a viral illness with her upper respiratory symptoms.  She denies any fevers or chills.  On arrival, the patient was afebrile, borderline tachycardic P99, respiratory rate 16, hypertensive BP 156/102, saturating 100% on room air.  Sinus rhythm noted on cardiac telemetry.  Patient presenting with concerning findings for possible recurrent DVT or possibly PE.  Additionally considered viral URI or developing bacterial pneumonia.    DVT study performed from triage resulted negative for acute DVT.  Will obtain CK.  Laboratory work-up initiated significant for  normal hCG, leukocytosis to 16, BMP unremarkable, anemia to 10.1, appears to be at baseline for the patient.  We will obtain CTA PE imaging to evaluate for PE in the  setting of the patient's symptoms.  We will also evaluate for pneumonia with this imaging.  Will obtain COVID-19 and influenza PCR testing and a CK to evaluate for myositis.  We will obtain CT of the left thigh to evaluate for vascular abnormality or soft tissue abscess vs myositis.  The patient's CK was normal.  CT of the left femur was performed which revealed mild to moderate diffuse lateral and posterior lateral left thigh subcutaneous fat edema and swelling with no walled off fluid collection or abscess.  I discussed the CT imaging with radiology who felt that there was not a significant amount of stranding around this fluid collection.  Despite this, given the patient's leukocytosis and worsening pain and warmth, will treat for developing cellulitis in this area.  CT angio of the chest revealed no pulmonary embolus, heterogenous pulmonary parenchyma with mild central bronchial thickening which can be seen in small airways disease.  The patient likely has a viral illness.  Bilateral hydronephrosis was present although the patient has no urinary symptoms at this time.  The patient was administered Dilaudid for pain control.  We will treat for cellulitis of the left thigh with Keflex.  Advised the patient follow-up with her PCP for recheck.  Final Clinical Impression(s) / ED Diagnoses Final diagnoses:  Acute pain of left thigh  Cellulitis of left lower extremity  Upper respiratory tract infection, unspecified type    Rx / DC Orders ED Discharge Orders          Ordered    HYDROcodone-acetaminophen (NORCO/VICODIN) 5-325 MG tablet  Every 4 hours PRN        11/07/21 1801    cephALEXin (KEFLEX) 500 MG capsule  4 times daily        11/07/21 1801              Regan Lemming, MD 11/07/21  1937 ° °

## 2021-11-07 NOTE — ED Provider Triage Note (Signed)
Emergency Medicine Provider Triage Evaluation Note ? ?Robin Lopez , a 41 y.o. female  was evaluated in triage.  Pt complains of left upper leg pain.  Symptoms began a few days ago.  Pain is worse with movement.  She was diagnosed with a DVT in her left calf in September 2022.  She had repeat ultrasound done in December which showed that the clot has resolved.  She is currently still on anticoagulation.  She has been experiencing some chest tightness and shortness of breath.  No history of PE that she is aware of.  She has been compliant with her anticoagulant.  Denies any injury or trauma. ? ?Review of Systems  ?Positive: Left leg pain, chest tightness, shortness of breath, cough ?Negative: Numbness ? ?Physical Exam  ?BP (!) 156/102 (BP Location: Right Arm)   Pulse 99   Temp 98.5 ?F (36.9 ?C) (Oral)   Resp 16   LMP 10/09/2021   SpO2 100%  ?Gen:   Awake, no distress   ?Resp:  Normal effort  ?MSK:   Moves extremities without difficulty  ?Other:  2+ DP pulse palpated bilaterally, no significant edema ? ?Medical Decision Making  ?Medically screening exam initiated at 11:08 AM.  Appropriate orders placed.  Robin Lopez was informed that the remainder of the evaluation will be completed by another provider, this initial triage assessment does not replace that evaluation, and the importance of remaining in the ED until their evaluation is complete. ? ?Labs and ultrasound ordered ?  ?Robin Heady, PA-C ?11/07/21 1109 ? ?

## 2021-11-07 NOTE — Discharge Instructions (Addendum)
Your CT imaging revealed soft tissue swelling without enhancement or abscess.  You do have an elevated white count which does suggest potential developing infection.  We will treat for developing early cellulitis with antibiotics.  I will give you Norco for pain control.  Your CT of your chest revealed no evidence of blood clots, development of potential viral process.  Your COVID and flu was negative.  Your ultrasound of the left lower extremity revealed no evidence of blood clots.  Continue taking your blood thinning medication as prescribed.  Follow-up with your PCP in 3 days for recheck. ?

## 2021-11-07 NOTE — ED Notes (Signed)
The pt is c/o very much pain noq tearful ?

## 2021-11-07 NOTE — ED Notes (Signed)
Ck added to the blood already in the lab ?

## 2021-11-12 ENCOUNTER — Ambulatory Visit (INDEPENDENT_AMBULATORY_CARE_PROVIDER_SITE_OTHER): Payer: 59 | Admitting: Family Medicine

## 2021-11-12 ENCOUNTER — Encounter: Payer: Self-pay | Admitting: Family Medicine

## 2021-11-12 VITALS — BP 126/76 | HR 76 | Temp 98.1°F | Ht 66.0 in | Wt 256.0 lb

## 2021-11-12 DIAGNOSIS — M79652 Pain in left thigh: Secondary | ICD-10-CM

## 2021-11-12 DIAGNOSIS — D649 Anemia, unspecified: Secondary | ICD-10-CM | POA: Diagnosis not present

## 2021-11-12 DIAGNOSIS — D509 Iron deficiency anemia, unspecified: Secondary | ICD-10-CM

## 2021-11-12 DIAGNOSIS — E538 Deficiency of other specified B group vitamins: Secondary | ICD-10-CM

## 2021-11-12 LAB — CBC WITH DIFFERENTIAL/PLATELET
Basophils Absolute: 0.1 10*3/uL (ref 0.0–0.1)
Basophils Relative: 0.6 % (ref 0.0–3.0)
Eosinophils Absolute: 0.2 10*3/uL (ref 0.0–0.7)
Eosinophils Relative: 1.8 % (ref 0.0–5.0)
HCT: 30.7 % — ABNORMAL LOW (ref 36.0–46.0)
Hemoglobin: 10.1 g/dL — ABNORMAL LOW (ref 12.0–15.0)
Lymphocytes Relative: 19.1 % (ref 12.0–46.0)
Lymphs Abs: 2.4 10*3/uL (ref 0.7–4.0)
MCHC: 32.8 g/dL (ref 30.0–36.0)
MCV: 71.6 fl — ABNORMAL LOW (ref 78.0–100.0)
Monocytes Absolute: 0.7 10*3/uL (ref 0.1–1.0)
Monocytes Relative: 5.3 % (ref 3.0–12.0)
Neutro Abs: 9.2 10*3/uL — ABNORMAL HIGH (ref 1.4–7.7)
Neutrophils Relative %: 73.2 % (ref 43.0–77.0)
Platelets: 357 10*3/uL (ref 150.0–400.0)
RBC: 4.29 Mil/uL (ref 3.87–5.11)
RDW: 17 % — ABNORMAL HIGH (ref 11.5–15.5)
WBC: 12.6 10*3/uL — ABNORMAL HIGH (ref 4.0–10.5)

## 2021-11-12 LAB — IRON,TIBC AND FERRITIN PANEL: Iron: 18

## 2021-11-12 LAB — FERRITIN: Ferritin: 12.5 ng/mL (ref 10.0–291.0)

## 2021-11-12 LAB — VITAMIN B12: Vitamin B-12: 360 pg/mL (ref 211–911)

## 2021-11-12 NOTE — Progress Notes (Signed)
Established Patient Office Visit  Subjective:  Patient ID: Robin Lopez, female    DOB: 1981-07-06  Age: 41 y.o. MRN: [id number]  CC:  Chief Complaint  Patient presents with   Hospitalization Follow-up    HPI Robin Lopez presents for recent ER visit.  She was seen on the eighth with acute pain left upper thigh region.  It was noted that she has history of DVT left lower extremity and patient on Xarelto with good compliance with therapy.  She also noticed some productive cough and mild shortness of breath.  No prior history of PE.  No fevers or chills.  Denied any left lower extremity injury.  Patient had labs with white count of 16,000.  Platelets slightly elevated.  Hemoglobin 10.1 with MCV of 73.  She underwent venous Doppler which showed no acute DVT.  Creatinine kinase normal.  CT angiogram no PE and no pneumonia.  COVID and influenza testing negative.  CT left thigh no acute abnormality.  Patient ended up being started on Keflex for possible early cellulitis though apparently she had no erythema on her exam though there was some warmth.  She states her thigh pain has improved on the Keflex.  She has history of anemia as above.  Confirmed iron deficiency.  Went to the ER early January with some hematochezia.  Was referred to GI.  Even though there was recognition that her menorrhagia was probably the cause of her iron deficiency anemia, in the setting of rectal bleeding, colonoscopy recommended.  Decision was made to hold on EGD and colonoscopy until after surgery for her hematochezia.  She however has not had any significant vaginal bleeding now for months on Micronor.  She had an Futures trader with getting her hysterectomy in March and that is on hold at this time.  Denies any abdominal pain.  No recent hematochezia.    History reviewed. No pertinent past medical history.  Past Surgical History:  Procedure Laterality Date   CESAREAN SECTION     x 2   CHOLECYSTECTOMY      TUBAL LIGATION     WISDOM TOOTH EXTRACTION      Family History  Problem Relation Age of Onset   Hypertension Father    CAD Father    Deep vein thrombosis Brother     Social History   Socioeconomic History   Marital status: Divorced    Spouse name: Not on file   Number of children: Not on file   Years of education: Not on file   Highest education level: Not on file  Occupational History   Not on file  Tobacco Use   Smoking status: Never   Smokeless tobacco: Never  Substance and Sexual Activity   Alcohol use: No   Drug use: No   Sexual activity: Yes    Birth control/protection: Surgical  Other Topics Concern   Not on file  Social History Narrative   Not on file   Social Determinants of Health   Financial Resource Strain: Not on file  Food Insecurity: Not on file  Transportation Needs: Not on file  Physical Activity: Not on file  Stress: Not on file  Social Connections: Not on file  Intimate Partner Violence: Not on file    Outpatient Medications Prior to Visit  Medication Sig Dispense Refill   Cyanocobalamin (VITAMIN B-12) 5000 MCG TBDP Take 5,000 mcg by mouth daily.     ferrous sulfate 324 MG TBEC Take 324 mg by mouth daily with breakfast.  metoprolol tartrate (LOPRESSOR) 100 MG tablet Take one tablet by mouth 2 hours prior to CT 1 tablet 0   norethindrone (MICRONOR) 0.35 MG tablet Take 1 tablet by mouth daily.     famotidine (PEPCID) 40 MG tablet Take 1 tablet (40 mg total) by mouth daily. 30 tablet 5   norethindrone (MICRONOR) 0.35 MG tablet Take 1 tablet by mouth daily.     rivaroxaban (XARELTO) 20 MG TABS tablet Take 1 tablet (20 mg total) by mouth daily with supper. 30 tablet 4   cephALEXin (KEFLEX) 500 MG capsule Take 1 capsule (500 mg total) by mouth 4 (four) times daily. 20 capsule 0   HYDROcodone-acetaminophen (NORCO/VICODIN) 5-325 MG tablet Take 2 tablets by mouth every 4 (four) hours as needed. 10 tablet 0   No facility-administered medications  prior to visit.    Allergies  Allergen Reactions   Prednisone Itching and Rash    ROS Review of Systems  Constitutional:  Negative for chills and fever.  Respiratory:  Negative for shortness of breath.   Cardiovascular:  Negative for chest pain.  Gastrointestinal:  Negative for abdominal pain, blood in stool, nausea and vomiting.     Objective:    Physical Exam Vitals reviewed.  Constitutional:      Appearance: Normal appearance.  Cardiovascular:     Rate and Rhythm: Normal rate and regular rhythm.  Pulmonary:     Effort: Pulmonary effort is normal.     Breath sounds: Normal breath sounds. No wheezing or rales.  Neurological:     Mental Status: She is alert.    BP 126/76 (BP Location: Left Arm, Patient Position: Sitting, Cuff Size: Normal)    Pulse 76    Temp 98.1 F (36.7 C) (Oral)    Ht '5\' 6"'$  (1.676 m)    Wt 256 lb (116.1 kg)    SpO2 95%    BMI 41.32 kg/m  Wt Readings from Last 3 Encounters:  11/12/21 256 lb (116.1 kg)  10/12/21 249 lb (112.9 kg)  09/11/21 254 lb 8 oz (115.4 kg)     Health Maintenance Due  Topic Date Due   COVID-19 Vaccine (1) Never done   HIV Screening  Never done   Hepatitis C Screening  Never done   TETANUS/TDAP  Never done   INFLUENZA VACCINE  Never done    There are no preventive care reminders to display for this patient.  Lab Results  Component Value Date   TSH 2.898 06/05/2018   Lab Results  Component Value Date   WBC 16.1 (H) 11/07/2021   HGB 10.1 (L) 11/07/2021   HCT 31.9 (L) 11/07/2021   MCV 73.8 (L) 11/07/2021   PLT 405 (H) 11/07/2021   Lab Results  Component Value Date   NA 136 11/07/2021   K 4.1 11/07/2021   CO2 22 11/07/2021   GLUCOSE 164 (H) 11/07/2021   BUN 11 11/07/2021   CREATININE 0.77 11/07/2021   BILITOT 0.5 09/07/2021   ALKPHOS 151 (H) 09/07/2021   AST 23 09/07/2021   ALT 19 09/07/2021   PROT 7.4 09/07/2021   ALBUMIN 4.0 09/07/2021   CALCIUM 8.7 (L) 11/07/2021   ANIONGAP 9 11/07/2021   No  results found for: CHOL No results found for: HDL No results found for: LDLCALC No results found for: TRIG No results found for: CHOLHDL No results found for: HGBA1C    Assessment & Plan:   #1 recent left thigh pain.  Was treated for cellulitis though diagnosis was somewhat equivocal.  She is improving on Keflex.  No evidence for recurrent DVT.  Patient on Xarelto  #2 history of left lower extremity DVT.  Patient maintained on Xarelto currently.  No evidence for DVT on recent venous study  #3 history of menorrhagia.  Patient had severe anemia several months ago.  Refer to GYN.  Planned hysterectomy but that is on hold at this time.  Hemoglobin stable.  Her menorrhagia has been stabilized with Micronor  #4 iron deficiency anemia.  Suspected related to #3.  Patient has had episode of hematochezia and was referred to GI and decision was made to hold on EGD and colonoscopy until after her hysterectomy-though her hysterectomy has been delayed as above.  -Recheck CBC, B12, iron studies.  If iron studies remain low will reconsult with GI and possibly hematology.  She is on iron replacement and has been for several months with no obvious GYN losses at this time.  Needs further evaluation if remains low   No orders of the defined types were placed in this encounter.   Follow-up: No follow-ups on file.    Carolann Littler, MD

## 2021-11-13 ENCOUNTER — Telehealth: Payer: Self-pay

## 2021-11-13 LAB — IRON AND TIBC
Iron Saturation: 6 % — CL (ref 15–55)
Iron: 18 ug/dL — ABNORMAL LOW (ref 27–159)
Total Iron Binding Capacity: 306 ug/dL (ref 250–450)
UIBC: 288 ug/dL (ref 131–425)

## 2021-11-13 NOTE — Telephone Encounter (Deleted)
-----   Message from Levin Erp, [place] sent at 11/13/2021 10:39 AM EDT ----- ?Regarding: Anemia ?Dr. Elease Hashimoto, ?  I appreciate your follow up. We will get her back in to see me ASAP to discuss EGD/Colonoscopy. Do you prescribe her anticoagulation?  Are you ok with [place] holding it? ? ?Thank you, ?Ellouise Newer, PA-C ? ?[place], ?  Can you call patient and relay Dr. Erick Blinks recommendations. We need to discuss sooner EGD and Colonoscopy. ? ?Thanks-JLL ? ?----- Message ----- ?From: [name], MD ?Sent: 11/13/2021  10:36 AM EDT ?To: Levin Erp, PA ? ?[place], ? ?I am very concerned about this patient's persistent anemia and iron deficiency.  We initially saw her with severe menorrhagia (in the setting of severe fibroids) and assumed that was probably the source of her severe anemia.  She was referred to GYN and started on Micronor-and has now had essentially no vaginal bleeding for several months on the Micronor and she remains on daily oral iron.  She was being scheduled for surgery for hysterectomy but apparently there was a glitch in her coverage and she was concerned about financial issues with the surgery and that has been postponed for now.  I did discuss with patient that she may have an issue with iron absorption potentially but we certainly have concerns to make sure she has no GI losses associated with her ongoing iron deficiency anemia.  She does remain on anticoagulation but is now several months out of her DVT.  Her DVT did occur in the setting of COVID infection which was likely contributing to her risk.  I have also encouraged patient to continue follow-up with hematology especially if GI work-up is unrevealing as she may benefit from iron infusion.   ? ?Bruce ?----- Message ----- ?From: Interface, [facility] ?Sent: 11/12/2021   2:37 PM EDT ?To: [name], MD ? ? ? ?

## 2021-11-13 NOTE — Telephone Encounter (Signed)
Robin Newer, PA-C stated that pt needs an OV. I returned call to patient and scheduled her for a f/u appt with Robin Newer, PA-C on Thursday, 11/22/21 at 11:30 am. Pt verbalized understanding and had no concerns at the end of the call. ?

## 2021-11-13 NOTE — Telephone Encounter (Signed)
Pt returned call. She has already been informed of lab results by Dr. Erick Blinks office. Pt is aware that I am checking to see if she needs an office visit or if we can schedule her directly for her procedures. Pt knows that I will be in contact once I have more information. ?

## 2021-11-13 NOTE — Telephone Encounter (Signed)
-----   Message from Levin Erp, [place] sent at 11/13/2021 10:52 AM EDT ----- ?Regarding: FW: Anemia ?Is there any way we can document the below in her chart? ? ?Thanks-JLL ?----- Message ----- ?From: [name], MD ?Sent: 11/13/2021  10:49 AM EDT ?To: Levin Erp, PA ?Subject: RE: Anemia                                    ? ?We did prescribe her anticoagulation.  I think at this point this many months out she would be fine to hold her anticoagulant a couple days before endoscopy.  She does not have any history of PE.  She had recent repeat Dopplers also of her left lower extremity which did not show any recurrent DVT ? ?Bruce ?----- Message ----- ?From: Levin Erp, PA ?Sent: 11/13/2021  10:41 AM EDT ?To: [name], MD, Yevette Edwards, RN ?Subject: Anemia                                        ? ?Dr. Elease Hashimoto, ?  I appreciate your follow up. We will get her back in to see me ASAP to discuss EGD/Colonoscopy. Do you prescribe her anticoagulation?  Are you ok with [place] holding it? ? ?Thank you, ?Ellouise Newer, PA-C ? ?[place], ?  Can you call patient and relay Dr. Erick Blinks recommendations. We need to discuss sooner EGD and Colonoscopy. ? ?Thanks-JLL ? ?----- Message ----- ?From: [name], MD ?Sent: 11/13/2021  10:36 AM EDT ?To: Levin Erp, PA ? ?[place], ? ?I am very concerned about this patient's persistent anemia and iron deficiency.  We initially saw her with severe menorrhagia (in the setting of severe fibroids) and assumed that was probably the source of her severe anemia.  She was referred to GYN and started on Micronor-and has now had essentially no vaginal bleeding for several months on the Micronor and she remains on daily oral iron.  She was being scheduled for surgery for hysterectomy but apparently there was a glitch in her coverage and she was concerned about financial issues with the surgery and that has been postponed for now.  I did  discuss with patient that she may have an issue with iron absorption potentially but we certainly have concerns to make sure she has no GI losses associated with her ongoing iron deficiency anemia.  She does remain on anticoagulation but is now several months out of her DVT.  Her DVT did occur in the setting of COVID infection which was likely contributing to her risk.  I have also encouraged patient to continue follow-up with hematology especially if GI work-up is unrevealing as she may benefit from iron infusion.   ? ?Bruce ?----- Message ----- ?From: Interface, [facility] ?Sent: 11/12/2021   2:37 PM EDT ?To: [name], MD ? ? ? ? ? ?

## 2021-11-13 NOTE — Telephone Encounter (Signed)
Lm on vm for patient to return call 

## 2021-11-14 ENCOUNTER — Encounter: Payer: Self-pay | Admitting: Family Medicine

## 2021-11-14 NOTE — Addendum Note (Signed)
Addended by: Nilda Riggs on: 11/14/2021 09:02 AM ? ? Modules accepted: Orders ? ?

## 2021-11-15 ENCOUNTER — Telehealth: Payer: Self-pay | Admitting: Hematology and Oncology

## 2021-11-15 NOTE — Telephone Encounter (Signed)
Scheduled appt per 3/15 referral. Pt is aware of appt date and time. Pt is aware to arrive 30 mins prior to appt time and to bring and updated insurance card. Pt is aware of appt location.   ?

## 2021-11-16 ENCOUNTER — Encounter: Payer: Self-pay | Admitting: Family Medicine

## 2021-11-20 ENCOUNTER — Encounter: Payer: Self-pay | Admitting: Hematology and Oncology

## 2021-11-20 ENCOUNTER — Other Ambulatory Visit: Payer: Self-pay

## 2021-11-20 ENCOUNTER — Inpatient Hospital Stay: Payer: 59 | Attending: Hematology and Oncology | Admitting: Hematology and Oncology

## 2021-11-20 DIAGNOSIS — N920 Excessive and frequent menstruation with regular cycle: Secondary | ICD-10-CM | POA: Insufficient documentation

## 2021-11-20 DIAGNOSIS — D259 Leiomyoma of uterus, unspecified: Secondary | ICD-10-CM | POA: Diagnosis not present

## 2021-11-20 DIAGNOSIS — I82442 Acute embolism and thrombosis of left tibial vein: Secondary | ICD-10-CM | POA: Insufficient documentation

## 2021-11-20 DIAGNOSIS — D509 Iron deficiency anemia, unspecified: Secondary | ICD-10-CM | POA: Insufficient documentation

## 2021-11-20 DIAGNOSIS — Z7901 Long term (current) use of anticoagulants: Secondary | ICD-10-CM | POA: Insufficient documentation

## 2021-11-20 DIAGNOSIS — D500 Iron deficiency anemia secondary to blood loss (chronic): Secondary | ICD-10-CM | POA: Insufficient documentation

## 2021-11-20 DIAGNOSIS — D219 Benign neoplasm of connective and other soft tissue, unspecified: Secondary | ICD-10-CM

## 2021-11-20 NOTE — Assessment & Plan Note (Signed)
There is no contraindication for her to proceed with surgical intervention if needed for uterine fibroid ?She could benefit from 4 to 6 weeks course of prophylactic Lovenox after surgery ?

## 2021-11-20 NOTE — Assessment & Plan Note (Addendum)
From the anticoagulation standpoint, she has completed 6 months worth of anticoagulation therapy ?Repeat venous Doppler ultrasound on January 13 and March 8 showed no evidence of DVT ?Technically, we can discontinue her anticoagulation therapy ?However, the presence of birth control pill could increase her risk of recurrent DVT ?Her current anticoagulation therapy is causing excessive bleeding to the point she needed intravenous iron infusion ?I recommend consideration for aspirin therapy only moving forward, after she runs out of Xarelto ?In the future, I will see her again perioperatively and I will recommend prophylactic doses of Lovenox after hysterectomy to reduce risk of recurrent DVT ?

## 2021-11-20 NOTE — Progress Notes (Signed)
[place] ?CONSULT NOTE ? ?Patient Care Team: ?[name], MD as PCP - General (Family Medicine) ?Sueanne Margarita, MD as PCP - Cardiology (Cardiology) ? ?ASSESSMENT & PLAN:  ?Acute DVT of left tibial vein ([facility]) ?From the anticoagulation standpoint, she has completed 6 months worth of anticoagulation therapy ?Repeat venous Doppler ultrasound on January 13 and March 8 showed no evidence of DVT ?Technically, we can discontinue her anticoagulation therapy ?However, the presence of birth control pill could increase her risk of recurrent DVT ?Her current anticoagulation therapy is causing excessive bleeding to the point she needed intravenous iron infusion ?I recommend consideration for aspirin therapy only moving forward, after she runs out of Xarelto ?In the future, I will see her again perioperatively and I will recommend prophylactic doses of Lovenox after hysterectomy to reduce risk of recurrent DVT ? ?Blood loss anemia ?She has severe iron deficiency anemia due to presence of uterine fibroid causing menorrhagia and recent use of anticoagulation therapy ?The most likely cause of her anemia is due to chronic blood loss/malabsorption syndrome. ?We discussed some of the risks, benefits, and alternatives of intravenous iron infusions. The patient is symptomatic from anemia and the iron level is critically low. She tolerated oral iron supplement poorly and desires to achieved higher levels of iron faster for adequate hematopoesis. Some of the side-effects to be expected including risks of infusion reactions, phlebitis, headaches, nausea and fatigue.  The patient is willing to proceed. Patient education material was dispensed.  ?Goal is to keep ferritin level greater than 50 and resolution of anemia ? ? ?Fibroids ?There is no contraindication for her to proceed with surgical intervention if needed for uterine fibroid ?She could benefit from 4 to 6 weeks course of prophylactic Lovenox after  surgery ?Orders Placed This Encounter  ?Procedures  ? CBC with Differential/Platelet  ?  Standing Status:   Standing  ?  Number of Occurrences:   12  ?  Standing Expiration Date:   11/21/2022  ? Iron and Iron Binding Capacity ([facility] only)  ?  Standing Status:   Future  ?  Standing Expiration Date:   11/21/2022  ? Ferritin  ?  Standing Status:   Future  ?  Standing Expiration Date:   11/20/2022  ? ABO/Rh  ?  Standing Status:   Future  ?  Standing Expiration Date:   11/21/2022  ? ? ?All questions were answered. The patient knows to call the clinic with any problems, questions or concerns. ? ?The total time spent in the appointment was 60 minutes encounter with patients including review of chart and various tests results, discussions about plan of care and coordination of care plan ? ?Heath Lark, MD ?3/21/20231:29 PM ? ? ?CHIEF COMPLAINTS/PURPOSE OF CONSULTATION:  ?Anemia ? ?HISTORY OF PRESENTING ILLNESS:  ?Robin Lopez 41 y.o. female is here because of severe iron deficiency anemia secondary to menorrhagia, in the setting of anticoagulation therapy for history of DVT ? ?She was found to have abnormal CBC from recent blood work ?The patient had COVID infection last year and soon after that, developed left lower extremity DVT ? ?On May 12, 2021, she had bilateral lower extremity venous Doppler which showed  ?RIGHT:  ?- No evidence of common femoral vein obstruction.  ?   ?LEFT:  ?- Findings consistent with acute deep vein thrombosis involving the left posterior tibial veins.  ? ?She was anticoagulated with Xarelto ?The patient also have significant menorrhagia secondary to uterine fibroid to the point she  needed blood transfusion in September 2022 ?She remained on anticoagulation therapy since then ?She is compliant taking her medications ?She had recurrent ER visit in January for GI bleed ?Due to severe anemia, the patient was placed on birth control pill to control menorrhagia and she has not have any  menstrual cycle since then ?There is plan for partial hysterectomy but due to insurance situation, she has not been scheduled ?She had 2 other ultrasound evaluation in January and March of this year which showed complete resolution of DVT ?She also had CT imaging of the chest which rule out PE ?She had numerous surgeries in the past, 2 pregnancies and prior history of birth control pills without clotting disorders or recurrent DVT ?She stated 1 brother had DVT but she does not know the circumstances leading to his DVT ?The patient does not smoke ?She does have chronic immobility due to her job sitting 8 to 10 hours/day ?She also admits she is not drinking enough fluids ? ?She had not noticed any recent bleeding such as hematuria but she had 1 episode of epistaxis 1 to 2 months ago.  Due to her history of passage of bright red blood per rectum, she is scheduled to see GI service and of the month ?The patient denies over the counter NSAID  ?She had no prior history or diagnosis of cancer. Her age appropriate screening programs are up-to-date. ?She has pica with ice craving; she eats a variety of diet. ?The patient was prescribed oral iron supplements and she takes daily but that caused severe constipation and stomach upset ? ?MEDICAL HISTORY:  ?Past Medical History:  ?Diagnosis Date  ? Anemia   ? Left leg DVT ([facility])   ? Menorrhagia   ? ? ?SURGICAL HISTORY: ?Past Surgical History:  ?Procedure Laterality Date  ? CESAREAN SECTION    ? x 2  ? CHOLECYSTECTOMY    ? TUBAL LIGATION    ? WISDOM TOOTH EXTRACTION    ? ? ?SOCIAL HISTORY: ?Social History  ? ?Socioeconomic History  ? Marital status: Divorced  ?  Spouse name: Not on file  ? Number of children: Not on file  ? Years of education: Not on file  ? Highest education level: Not on file  ?Occupational History  ? Not on file  ?Tobacco Use  ? Smoking status: Never  ? Smokeless tobacco: Never  ?Substance and Sexual Activity  ? Alcohol use: No  ? Drug use: No  ? Sexual activity:  Yes  ?  Birth control/protection: Surgical  ?Other Topics Concern  ? Not on file  ?Social History Narrative  ? Not on file  ? ?Social Determinants of Health  ? ?Financial Resource Strain: Not on file  ?Food Insecurity: Not on file  ?Transportation Needs: Not on file  ?Physical Activity: Not on file  ?Stress: Not on file  ?Social Connections: Not on file  ?Intimate Partner Violence: Not on file  ? ? ?FAMILY HISTORY: ?Family History  ?Problem Relation Age of Onset  ? Hypertension Father   ? CAD Father   ? Deep vein thrombosis Brother   ? ? ?ALLERGIES:  is allergic to prednisone. ? ?MEDICATIONS:  ?Current Outpatient Medications  ?Medication Sig Dispense Refill  ? rivaroxaban (XARELTO) 20 MG TABS tablet Take 20 mg by mouth daily with supper.    ? Cyanocobalamin (VITAMIN B-12) 5000 MCG TBDP Take 5,000 mcg by mouth daily.    ? ferrous sulfate 324 MG TBEC Take 324 mg by mouth daily with breakfast.    ?  metoprolol tartrate (LOPRESSOR) 100 MG tablet Take one tablet by mouth 2 hours prior to CT 1 tablet 0  ? norethindrone (MICRONOR) 0.35 MG tablet Take 1 tablet by mouth daily.    ? ?No current facility-administered medications for this visit.  ? ? ?REVIEW OF SYSTEMS:   ?Constitutional: Denies fevers, chills or abnormal night sweats ?Eyes: Denies blurriness of vision, double vision or watery eyes ?Ears, nose, mouth, throat, and face: Denies mucositis or sore throat ?Respiratory: Denies cough, dyspnea or wheezes ?Cardiovascular: Denies palpitation, chest discomfort or lower extremity swelling ?Gastrointestinal:  Denies nausea, heartburn or change in bowel habits ?Skin: Denies abnormal skin rashes ?Lymphatics: Denies new lymphadenopathy or easy bruising ?Neurological:Denies numbness, tingling or new weaknesses ?Behavioral/Psych: Mood is stable, no new changes  ?All other systems were reviewed with the patient and are negative. ? ?PHYSICAL EXAMINATION: ?ECOG PERFORMANCE STATUS: 1 - Symptomatic but completely ambulatory ? ?Vitals:   ? 11/20/21 1249  ?BP: (!) 147/69  ?Pulse: 79  ?Resp: 18  ?Temp: 98.6 ?F (37 ?C)  ?SpO2: 98%  ? ?Filed Weights  ? 11/20/21 1249  ?Weight: 257 lb (116.6 kg)  ? ? ?GENERAL:alert, no distress and comfortable ?SKIN: ski

## 2021-11-20 NOTE — Assessment & Plan Note (Signed)
She has severe iron deficiency anemia due to presence of uterine fibroid causing menorrhagia and recent use of anticoagulation therapy ?The most likely cause of her anemia is due to chronic blood loss/malabsorption syndrome. ?We discussed some of the risks, benefits, and alternatives of intravenous iron infusions. The patient is symptomatic from anemia and the iron level is critically low. She tolerated oral iron supplement poorly and desires to achieved higher levels of iron faster for adequate hematopoesis. Some of the side-effects to be expected including risks of infusion reactions, phlebitis, headaches, nausea and fatigue.  The patient is willing to proceed. Patient education material was dispensed.  ?Goal is to keep ferritin level greater than 50 and resolution of anemia ? ?

## 2021-11-22 ENCOUNTER — Encounter: Payer: Self-pay | Admitting: Physician Assistant

## 2021-11-22 ENCOUNTER — Ambulatory Visit (INDEPENDENT_AMBULATORY_CARE_PROVIDER_SITE_OTHER): Payer: 59 | Admitting: Physician Assistant

## 2021-11-22 VITALS — BP 138/70 | HR 80 | Ht 66.0 in | Wt 255.0 lb

## 2021-11-22 DIAGNOSIS — D219 Benign neoplasm of connective and other soft tissue, unspecified: Secondary | ICD-10-CM

## 2021-11-22 DIAGNOSIS — Z8719 Personal history of other diseases of the digestive system: Secondary | ICD-10-CM | POA: Diagnosis not present

## 2021-11-22 DIAGNOSIS — D509 Iron deficiency anemia, unspecified: Secondary | ICD-10-CM

## 2021-11-22 DIAGNOSIS — Z7901 Long term (current) use of anticoagulants: Secondary | ICD-10-CM

## 2021-11-22 MED ORDER — CLENPIQ 10-3.5-12 MG-GM -GM/160ML PO SOLN: ORAL | 0 refills | Status: DC

## 2021-11-22 NOTE — Progress Notes (Signed)
Agree with assessment and plan as outlined.  

## 2021-11-22 NOTE — Progress Notes (Signed)
? ?Chief Complaint: Follow-up IDA ? ?HPI: ?   Robin Lopez is a 41 year old female, assigned to Dr. Havery Moros, with a past medical history as listed below including DVT on Xarelto until just recently, who returns to clinic today for follow-up of her IDA. ?   09/06/2021 patient seen in the ED for melena.  Patient describes chronic left-sided abdominal pain secondary to fibroids and a couple days ago had noticed some bright red blood entwined in her stool with multiple bowel movements also some maroon blood. ?   09/07/2021 CT of the abdomen pelvis with contrast for left lower quadrant pain with no acute abnormality and enlarged fibroid uterus as well as mild hepatosplenomegaly. ?   09/07/2021 CMP with decreased sodium at 131 and minimally elevated alk phos at 151.  Lipase normal.  INR elevated at 2.0.  CBC with a hemoglobin of 10.8 (seems to be around this over the past 4 months).  White count elevated at 15.3.  Platelets elevated at 414. ?   09/11/2021 patient saw her PCP.  At that time discussed she had menorrhagia and saw GYN several months ago and placed on hormonal therapy which is slowed down her menstrual loss somewhat but she had recently had a fairly heavy bleed.  Also had COVID several months ago and subsequent DVT and was placed on Xarelto.  Plans are for hysterectomy in March, had noted some bright red blood in her stool couple days the week before.  Follow-up labs showed stable hemoglobin at 10.8. ?   10/12/2021 patient seen in clinic for her iron deficiency anemia, at that time suspected this was from heavy manses related to large fibroid, there were plans for hysterectomy in the coming months and she wanted to wait for further evaluation until after that.  Did describe occasional bright red blood in her stool over the past couple of years.  At that time recommended EGD and colonoscopy but patient wanted to wait until after her hysterectomy.  Patient was started on Pepcid 40 mg daily for reflux symptoms. ?    Since last visit above patient has followed with PCP and has been on medication for her fibroids and heavy menstrual bleeding and has had little to no menstrual bleeding over the past few months but iron remains low, so that she went to see hematology for iron infusions just last week.  Xarelto was also discussed and patient is going to come off this and be on just Aspirin.  I was contacted by her PCP due to concerns for continued iron deficiency anemia and want a procedure sooner. ?   11/12/2021 CBC with hemoglobin of 10.1 (seems fairly stable over the past 3 months).  Iron studies that day with an iron low at 18 and percent saturation low at 6. ?   11/20/2021 patient saw Dr. Alvy Bimler with hematology and at that time discussed that a repeat venous Doppler on January 13 and March 8 showed no evidence of DVT and patient was going to discontinue her anticoagulation therapy and stay on Aspirin going forward.  It was noted that her iron was critically low and she was given IV iron infusion.  Discussed this could be from her uterine fibroid or chronic blood loss/malabsorption. ?   Today, patient presents to clinic and tells me that she has iron infusion set up every other day for the next couple of weeks due to her low iron.  Explains that regardless of being on birth control to control her menorrhagia she has  continued to be iron deficient.  Per her other physicians they would like her to have GI work-up first for malabsorption versus GI source of blood loss.  Patient tells me she has seen no further rectal bleeding since the couple of incidents a few months ago.  In general she is feeling okay, but wants to get everything figured out prior to having her hysterectomy which she now plans to schedule after GI evaluation. ?   In regards to patient's Xarelto, she is going to stop this in 5 days under direction of her hematologist.  She is then meant to continue on Aspirin daily. ?   Denies fever, chills or weight loss. ? ?Past  Medical History:  ?Diagnosis Date  ? Anemia   ? Left leg DVT ([place])   ? Menorrhagia   ? ? ?Past Surgical History:  ?Procedure Laterality Date  ? CESAREAN SECTION    ? x 2  ? CHOLECYSTECTOMY    ? TUBAL LIGATION    ? WISDOM TOOTH EXTRACTION    ? ? ?Current Outpatient Medications  ?Medication Sig Dispense Refill  ? Cyanocobalamin (VITAMIN B-12) 5000 MCG TBDP Take 5,000 mcg by mouth daily.    ? ferrous sulfate 324 MG TBEC Take 324 mg by mouth daily with breakfast.    ? metoprolol tartrate (LOPRESSOR) 100 MG tablet Take one tablet by mouth 2 hours prior to CT 1 tablet 0  ? norethindrone (MICRONOR) 0.35 MG tablet Take 1 tablet by mouth daily.    ? rivaroxaban (XARELTO) 20 MG TABS tablet Take 20 mg by mouth daily with supper.    ? ?No current facility-administered medications for this visit.  ? ? ?Allergies as of 11/22/2021 - Review Complete 11/22/2021  ?Allergen Reaction Noted  ? Prednisone Itching and Rash 02/26/2013  ? ? ?Family History  ?Problem Relation Age of Onset  ? Hypertension Father   ? CAD Father   ? Deep vein thrombosis Brother   ? ? ?Social History  ? ?Socioeconomic History  ? Marital status: Divorced  ?  Spouse name: Not on file  ? Number of children: Not on file  ? Years of education: Not on file  ? Highest education level: Not on file  ?Occupational History  ? Not on file  ?Tobacco Use  ? Smoking status: Never  ? Smokeless tobacco: Never  ?Substance and Sexual Activity  ? Alcohol use: No  ? Drug use: No  ? Sexual activity: Yes  ?  Birth control/protection: Surgical  ?Other Topics Concern  ? Not on file  ?Social History Narrative  ? Not on file  ? ?Social Determinants of Health  ? ?Financial Resource Strain: Not on file  ?Food Insecurity: Not on file  ?Transportation Needs: Not on file  ?Physical Activity: Not on file  ?Stress: Not on file  ?Social Connections: Not on file  ?Intimate Partner Violence: Not on file  ? ? ?Review of Systems:    ?Constitutional: No weight loss, fever or chills ?Cardiovascular: No  chest pain  ?Respiratory: No SOB ?Gastrointestinal: See HPI and otherwise negative ? ? Physical Exam:  ?Vital signs: ?BP 138/70   Pulse 80   Ht '5\' 6"'  (1.676 m)   Wt 255 lb (115.7 kg)   SpO2 96%   BMI 41.16 kg/m?   ? ?Constitutional:   Pleasant overweight Caucasian female appears to be in NAD, Well developed, Well nourished, alert and cooperative ?Respiratory: Respirations even and unlabored. Lungs clear to auscultation bilaterally.   No wheezes, crackles, or  rhonchi.  ?Cardiovascular: Normal S1, S2. No MRG. Regular rate and rhythm. No peripheral edema, cyanosis or pallor.  ?Gastrointestinal:  Soft, nondistended, nontender. No rebound or guarding. Normal bowel sounds. No appreciable masses or hepatomegaly. ?Rectal:  Not performed. ?Psychiatric: Oriented to person, place and time. Demonstrates good judgement and reason without abnormal affect or behaviors. ? ?RELEVANT LABS AND IMAGING: ?CBC ?   ?Component Value Date/Time  ? WBC 12.6 (H) 11/12/2021 1115  ? RBC 4.29 11/12/2021 1115  ? HGB 10.1 (L) 11/12/2021 1115  ? HGB 12.9 02/16/2013 1328  ? HCT 30.7 (L) 11/12/2021 1115  ? HCT 37.4 02/16/2013 1328  ? PLT 357.0 11/12/2021 1115  ? PLT 406 02/16/2013 1328  ? MCV 71.6 (L) 11/12/2021 1115  ? MCV 81 02/16/2013 1328  ? MCH 23.4 (L) 11/07/2021 1113  ? MCHC 32.8 11/12/2021 1115  ? RDW 17.0 (H) 11/12/2021 1115  ? RDW 15.3 (H) 02/16/2013 1328  ? LYMPHSABS 2.4 11/12/2021 1115  ? LYMPHSABS 3.2 10/06/2012 1348  ? MONOABS 0.7 11/12/2021 1115  ? MONOABS 0.6 10/06/2012 1348  ? EOSABS 0.2 11/12/2021 1115  ? EOSABS 0.2 10/06/2012 1348  ? BASOSABS 0.1 11/12/2021 1115  ? BASOSABS 0.1 10/06/2012 1348  ? ? ?CMP  ?   ?Component Value Date/Time  ? NA 136 11/07/2021 1113  ? NA 141 07/06/2020 1514  ? NA 138 10/06/2012 1348  ? K 4.1 11/07/2021 1113  ? K 4.1 10/06/2012 1348  ? CL 105 11/07/2021 1113  ? CL 106 10/06/2012 1348  ? CO2 22 11/07/2021 1113  ? CO2 28 10/06/2012 1348  ? GLUCOSE 164 (H) 11/07/2021 1113  ? GLUCOSE 76 10/06/2012 1348   ? BUN 11 11/07/2021 1113  ? BUN 13 07/06/2020 1514  ? BUN 11 10/06/2012 1348  ? CREATININE 0.77 11/07/2021 1113  ? CREATININE 0.75 10/06/2012 1348  ? CALCIUM 8.7 (L) 11/07/2021 1113  ? CALCIUM 8.8 10/06/18

## 2021-11-22 NOTE — Patient Instructions (Signed)
You have been scheduled for an endoscopy and colonoscopy. Please follow the written instructions given to you at your visit today. ?Please pick up your prep supplies at the pharmacy within the next 1-3 days. ?If you use inhalers (even only as needed), please bring them with you on the day of your procedure.  ? ?Per you, you will be completley off Xarelto in 5 days.  ? ?Follow up per recommendations after procedures  ? ?Due to recent changes in healthcare laws, you may see the results of your imaging and laboratory studies on MyChart before your provider has had a chance to review them.  We understand that in some cases there may be results that are confusing or concerning to you. Not all laboratory results come back in the same time frame and the provider may be waiting for multiple results in order to interpret others.  Please give [place] 48 hours in order for your provider to thoroughly review all the results before contacting the office for clarification of your results.   ? ?If you are age 76 or older, your body mass index should be between 23-30. Your Body mass index is 41.16 kg/m?Marland Kitchen If this is out of the aforementioned range listed, please consider follow up with your Primary Care Provider. ? ?If you are age 104 or younger, your body mass index should be between 19-25. Your Body mass index is 41.16 kg/m?Marland Kitchen If this is out of the aformentioned range listed, please consider follow up with your Primary Care Provider.  ? ?________________________________________________________ ? ?The [facility] providers would like to encourage you to use [facility] to communicate with providers for non-urgent requests or questions.  Due to long hold times on the telephone, sending your provider a message by [facility] may be a faster and more efficient way to get a response.  Please allow 48 business hours for a response.  Please remember that this is for non-urgent requests.  ?_______________________________________________________  ? ?I  appreciate the  opportunity to care for you ? ?Thank You  ? ?Lovett Calender ?

## 2021-11-26 ENCOUNTER — Other Ambulatory Visit: Payer: Self-pay

## 2021-11-26 ENCOUNTER — Inpatient Hospital Stay: Payer: 59

## 2021-11-26 VITALS — BP 122/83 | HR 66 | Temp 98.2°F | Resp 20

## 2021-11-26 DIAGNOSIS — I82442 Acute embolism and thrombosis of left tibial vein: Secondary | ICD-10-CM | POA: Diagnosis not present

## 2021-11-26 DIAGNOSIS — D500 Iron deficiency anemia secondary to blood loss (chronic): Secondary | ICD-10-CM

## 2021-11-26 MED ORDER — SODIUM CHLORIDE 0.9 % IV SOLN
400.0000 mg | Freq: Once | INTRAVENOUS | Status: AC
  Administered 2021-11-26: 400 mg via INTRAVENOUS
  Filled 2021-11-26: qty 20

## 2021-11-26 MED ORDER — SODIUM CHLORIDE 0.9 % IV SOLN: Freq: Once | INTRAVENOUS | Status: AC

## 2021-11-26 NOTE — Patient Instructions (Signed)

## 2021-11-28 ENCOUNTER — Inpatient Hospital Stay: Payer: 59

## 2021-11-28 ENCOUNTER — Other Ambulatory Visit: Payer: Self-pay

## 2021-11-28 VITALS — BP 131/76 | HR 64 | Temp 98.5°F | Resp 17

## 2021-11-28 DIAGNOSIS — D500 Iron deficiency anemia secondary to blood loss (chronic): Secondary | ICD-10-CM

## 2021-11-28 DIAGNOSIS — I82442 Acute embolism and thrombosis of left tibial vein: Secondary | ICD-10-CM | POA: Diagnosis not present

## 2021-11-28 MED ORDER — SODIUM CHLORIDE 0.9 % IV SOLN: Freq: Once | INTRAVENOUS | Status: AC

## 2021-11-28 MED ORDER — SODIUM CHLORIDE 0.9 % IV SOLN
400.0000 mg | Freq: Once | INTRAVENOUS | Status: AC
  Administered 2021-11-28: 400 mg via INTRAVENOUS
  Filled 2021-11-28: qty 20

## 2021-11-28 NOTE — Patient Instructions (Signed)

## 2021-11-28 NOTE — Progress Notes (Signed)
Patient observed for 30 minutes following administration of venofer infusion. Vital signs retaken and remained stable. Patient showed no signs of distress upon discharge.  

## 2021-11-30 ENCOUNTER — Other Ambulatory Visit: Payer: Self-pay

## 2021-11-30 ENCOUNTER — Inpatient Hospital Stay: Payer: 59

## 2021-11-30 VITALS — BP 132/78 | HR 67 | Temp 97.7°F | Resp 18 | Wt 251.8 lb

## 2021-11-30 DIAGNOSIS — I82442 Acute embolism and thrombosis of left tibial vein: Secondary | ICD-10-CM | POA: Diagnosis not present

## 2021-11-30 DIAGNOSIS — D500 Iron deficiency anemia secondary to blood loss (chronic): Secondary | ICD-10-CM

## 2021-11-30 MED ORDER — SODIUM CHLORIDE 0.9 % IV SOLN
400.0000 mg | Freq: Once | INTRAVENOUS | Status: AC
  Administered 2021-11-30: 400 mg via INTRAVENOUS
  Filled 2021-11-30: qty 20

## 2021-11-30 MED ORDER — SODIUM CHLORIDE 0.9 % IV SOLN: Freq: Once | INTRAVENOUS | Status: AC

## 2021-11-30 NOTE — Patient Instructions (Signed)

## 2021-12-03 ENCOUNTER — Other Ambulatory Visit: Payer: Self-pay

## 2021-12-03 ENCOUNTER — Inpatient Hospital Stay: Payer: 59 | Attending: Hematology and Oncology

## 2021-12-03 VITALS — BP 138/86 | HR 73 | Temp 98.0°F | Resp 18 | Wt 252.0 lb

## 2021-12-03 DIAGNOSIS — D259 Leiomyoma of uterus, unspecified: Secondary | ICD-10-CM | POA: Diagnosis present

## 2021-12-03 DIAGNOSIS — I82442 Acute embolism and thrombosis of left tibial vein: Secondary | ICD-10-CM | POA: Insufficient documentation

## 2021-12-03 DIAGNOSIS — D500 Iron deficiency anemia secondary to blood loss (chronic): Secondary | ICD-10-CM | POA: Insufficient documentation

## 2021-12-03 MED ORDER — SODIUM CHLORIDE 0.9 % IV SOLN: Freq: Once | INTRAVENOUS | Status: AC

## 2021-12-03 MED ORDER — SODIUM CHLORIDE 0.9 % IV SOLN
400.0000 mg | Freq: Once | INTRAVENOUS | Status: AC
  Administered 2021-12-03: 400 mg via INTRAVENOUS
  Filled 2021-12-03: qty 20

## 2021-12-03 NOTE — Progress Notes (Signed)
Today patient came in with several complaints from the last week or so-  bone pain, lack of appetite, nausea (no vomiting), fatigue and occasional, mild orthostatic hypotension. Sent a message to Dr. Alvy Bimler and she was made aware. No reason to hold treatment today per the doctor. Patient agreed. IV placed- ? ?BP 138/86 (BP Location: Right Arm, Patient Position: Sitting)   Pulse 73   Temp 98 ?F (36.7 ?C) (Oral)   Resp 18   Wt 252 lb (114.3 kg)   SpO2 97%   BMI 40.67 kg/m?  ?Patient tolerated her iron well today. Stayed for her 30 minute observation with no issues. IV removed with intact tip. Walked out on her own. AVS given. ?

## 2021-12-03 NOTE — Patient Instructions (Signed)

## 2021-12-04 ENCOUNTER — Encounter: Payer: Self-pay | Admitting: Family Medicine

## 2021-12-05 ENCOUNTER — Other Ambulatory Visit: Payer: Self-pay

## 2021-12-05 ENCOUNTER — Inpatient Hospital Stay: Payer: 59

## 2021-12-05 VITALS — BP 142/82 | HR 75 | Temp 98.0°F | Resp 18

## 2021-12-05 DIAGNOSIS — D500 Iron deficiency anemia secondary to blood loss (chronic): Secondary | ICD-10-CM

## 2021-12-05 DIAGNOSIS — D259 Leiomyoma of uterus, unspecified: Secondary | ICD-10-CM | POA: Diagnosis not present

## 2021-12-05 MED ORDER — SODIUM CHLORIDE 0.9 % IV SOLN
400.0000 mg | Freq: Once | INTRAVENOUS | Status: AC
  Administered 2021-12-05: 400 mg via INTRAVENOUS
  Filled 2021-12-05: qty 20

## 2021-12-05 MED ORDER — SODIUM CHLORIDE 0.9 % IV SOLN: Freq: Once | INTRAVENOUS | Status: AC

## 2021-12-05 NOTE — Patient Instructions (Signed)

## 2021-12-10 ENCOUNTER — Encounter: Payer: Self-pay | Admitting: Hematology and Oncology

## 2021-12-17 ENCOUNTER — Telehealth: Payer: Self-pay | Admitting: Family Medicine

## 2021-12-17 NOTE — Telephone Encounter (Signed)
Pt is calling checking on the status of FMLA paperwork ?

## 2021-12-17 NOTE — Telephone Encounter (Signed)
Pt informed of the message above and states that forms are not due until 01/2022 ?

## 2021-12-18 ENCOUNTER — Encounter: Payer: Self-pay | Admitting: Gastroenterology

## 2021-12-18 ENCOUNTER — Ambulatory Visit (AMBULATORY_SURGERY_CENTER): Payer: 59 | Admitting: Gastroenterology

## 2021-12-18 VITALS — BP 127/74 | HR 70 | Temp 98.1°F | Resp 15 | Ht 66.0 in | Wt 259.0 lb

## 2021-12-18 DIAGNOSIS — K317 Polyp of stomach and duodenum: Secondary | ICD-10-CM

## 2021-12-18 DIAGNOSIS — D509 Iron deficiency anemia, unspecified: Secondary | ICD-10-CM

## 2021-12-18 DIAGNOSIS — K5140 Inflammatory polyps of colon without complications: Secondary | ICD-10-CM

## 2021-12-18 DIAGNOSIS — K2100 Gastro-esophageal reflux disease with esophagitis, without bleeding: Secondary | ICD-10-CM | POA: Diagnosis not present

## 2021-12-18 DIAGNOSIS — D122 Benign neoplasm of ascending colon: Secondary | ICD-10-CM

## 2021-12-18 DIAGNOSIS — D124 Benign neoplasm of descending colon: Secondary | ICD-10-CM

## 2021-12-18 DIAGNOSIS — K641 Second degree hemorrhoids: Secondary | ICD-10-CM

## 2021-12-18 DIAGNOSIS — K921 Melena: Secondary | ICD-10-CM | POA: Diagnosis not present

## 2021-12-18 DIAGNOSIS — K635 Polyp of colon: Secondary | ICD-10-CM

## 2021-12-18 DIAGNOSIS — D125 Benign neoplasm of sigmoid colon: Secondary | ICD-10-CM

## 2021-12-18 DIAGNOSIS — Z8719 Personal history of other diseases of the digestive system: Secondary | ICD-10-CM

## 2021-12-18 MED ORDER — PANTOPRAZOLE SODIUM 40 MG PO TBEC: 40.0000 mg | DELAYED_RELEASE_TABLET | Freq: Two times a day (BID) | ORAL | 4 refills | Status: DC

## 2021-12-18 MED ORDER — SODIUM CHLORIDE 0.9 % IV SOLN: 500.0000 mL | Freq: Once | INTRAVENOUS | Status: DC

## 2021-12-18 NOTE — Op Note (Signed)
[place] ?Patient Name: Robin Lopez ?Procedure Date: 12/18/2021 2:20 PM ?MRN: [id number] ?Endoscopist: Gerrit Heck , MD ?Age: 41 ?Referring MD:  ?Date of Birth: Dec 08, 1980 ?Gender: Female ?Account #: [id number] ?Procedure:                Colonoscopy ?Indications:              Hematochezia, Iron deficiency anemia ?Medicines:                Monitored Anesthesia Care ?Procedure:                Pre-Anesthesia Assessment: ?                          - Prior to the procedure, a History and Physical  ?                          was performed, and patient medications and  ?                          allergies were reviewed. The patient's tolerance of  ?                          previous anesthesia was also reviewed. The risks  ?                          and benefits of the procedure and the sedation  ?                          options and risks were discussed with the patient.  ?                          All questions were answered, and informed consent  ?                          was obtained. Prior Anticoagulants: The patient has  ?                          taken no previous anticoagulant or antiplatelet  ?                          agents. ASA Grade Assessment: III - A patient with  ?                          severe systemic disease. After reviewing the risks  ?                          and benefits, the patient was deemed in  ?                          satisfactory condition to undergo the procedure. ?                          After obtaining informed consent, the colonoscope  ?  was passed under direct vision. Throughout the  ?                          procedure, the patient's blood pressure, pulse, and  ?                          oxygen saturations were monitored continuously. The  ?                          CF [id number] #[id number] was introduced through the anus  ?                          and advanced to the the terminal ileum. The  ?                          colonoscopy was  performed without difficulty. The  ?                          patient tolerated the procedure well. The quality  ?                          of the bowel preparation was good. The terminal  ?                          ileum, ileocecal valve, appendiceal orifice, and  ?                          rectum were photographed. ?Scope In: 2:54:49 PM ?Scope Out: 3:10:21 PM ?Scope Withdrawal Time: 0 hours 14 minutes 0 seconds  ?Total Procedure Duration: 0 hours 15 minutes 32 seconds  ?Findings:                 Hemorrhoids were found on perianal exam. ?                          A 2 mm polyp was found in the ascending colon. The  ?                          polyp was sessile. The polyp was removed with a  ?                          cold snare. Resection and retrieval were complete.  ?                          Estimated blood loss was minimal. ?                          Two sessile polyps were found in the sigmoid colon  ?                          and descending colon. The polyps were 3 to 6 mm in  ?  size. These polyps were removed with a cold snare.  ?                          Resection and retrieval were complete. Estimated  ?                          blood loss was minimal. ?                          Non-bleeding internal hemorrhoids were found during  ?                          retroflexion. The hemorrhoids were small and Grade  ?                          II (internal hemorrhoids that prolapse but reduce  ?                          spontaneously). ?                          The terminal ileum appeared normal. ?Complications:            No immediate complications. ?Estimated Blood Loss:     Estimated blood loss was minimal. ?Impression:               - Hemorrhoids found on perianal exam. ?                          - One 2 mm polyp in the ascending colon, removed  ?                          with a cold snare. Resected and retrieved. ?                          - Two 3 to 6 mm polyps in the sigmoid colon and  in  ?                          the descending colon, removed with a cold snare.  ?                          Resected and retrieved. ?                          - Non-bleeding internal hemorrhoids. ?                          - The examined portion of the ileum was normal. ?Recommendation:           - Patient has a contact number available for  ?                          emergencies. The signs and symptoms of potential  ?  delayed complications were discussed with the  ?                          patient. Return to normal activities tomorrow.  ?                          Written discharge instructions were provided to the  ?                          patient. ?                          - Resume previous diet. ?                          - Continue present medications. ?                          - Await pathology results. ?                          - Repeat colonoscopy for surveillance based on  ?                          pathology results. ?                          - Follow-up with Ellouise Newer in the GI office  ?                          at appointment to be scheduled. ?                          - Internal hemorrhoids were noted on this study and  ?                          may be amenable to hemorrhoid band ligation. If you  ?                          are interested in further treatment of these  ?                          hemorrhoids with band ligation, please contact the  ?                          GI clinic to set up an appointment for evaluation  ?                          and treatment. ?Gerrit Heck, MD ?12/18/2021 3:22:58 PM ?

## 2021-12-18 NOTE — Progress Notes (Signed)
Called to room to assist during endoscopic procedure.  Patient ID and intended procedure confirmed with present staff. Received instructions for my participation in the procedure from the performing physician.  

## 2021-12-18 NOTE — Progress Notes (Signed)
VS completed by CW. ? ? ?Medical history reviewed and updated. ?

## 2021-12-18 NOTE — Patient Instructions (Addendum)
Handout provided on esophagitis, polyps, hemorrhoids and hemorrhoid banding.  ? ?Use Protonix (pantoprazole) '40mg'$  by mouth twice daily for 6 weeks to promote mucosal healing of erosive esophagitis. Then reduce to '40mg'$  daily and titrate to lowest effective dose or discontinue if no reflux symptoms.  ? ?YOU HAD AN ENDOSCOPIC PROCEDURE TODAY AT [facility]:   Refer to the procedure report that was given to you for any specific questions about what was found during the examination.  If the procedure report does not answer your questions, please call your gastroenterologist to clarify.  If you requested that your care partner not be given the details of your procedure findings, then the procedure report has been included in a sealed envelope for you to review at your convenience later. ? ?YOU SHOULD EXPECT: Some feelings of bloating in the abdomen. Passage of more gas than usual.  Walking can help get rid of the air that was put into your GI tract during the procedure and reduce the bloating. If you had a lower endoscopy (such as a colonoscopy or flexible sigmoidoscopy) you may notice spotting of blood in your stool or on the toilet paper. If you underwent a bowel prep for your procedure, you may not have a normal bowel movement for a few days. ? ?Please Note:  You might notice some irritation and congestion in your nose or some drainage.  This is from the oxygen used during your procedure.  There is no need for concern and it should clear up in a day or so. ? ?SYMPTOMS TO REPORT IMMEDIATELY: ? ?Following lower endoscopy (colonoscopy or flexible sigmoidoscopy): ? Excessive amounts of blood in the stool ? Significant tenderness or worsening of abdominal pains ? Swelling of the abdomen that is new, acute ? Fever of 100?F or higher ? ?Following upper endoscopy (EGD) ? Vomiting of blood or coffee ground material ? New chest pain or pain under the shoulder blades ? Painful or persistently difficult  swallowing ? New shortness of breath ? Fever of 100?F or higher ? Black, tarry-looking stools ? ?For urgent or emergent issues, a gastroenterologist can be reached at any hour by calling [phone]. ?Do not use MyChart messaging for urgent concerns.  ? ? ?DIET:  We do recommend a small meal at first, but then you may proceed to your regular diet.  Drink plenty of fluids but you should avoid alcoholic beverages for 24 hours. ? ?ACTIVITY:  You should plan to take it easy for the rest of today and you should NOT DRIVE or use heavy machinery until tomorrow (because of the sedation medicines used during the test).   ? ?FOLLOW UP: ?Our staff will call the number listed on your records 48-72 hours following your procedure to check on you and address any questions or concerns that you may have regarding the information given to you following your procedure. If we do not reach you, we will leave a message.  We will attempt to reach you two times.  During this call, we will ask if you have developed any symptoms of COVID 19. If you develop any symptoms (ie: fever, flu-like symptoms, shortness of breath, cough etc.) before then, please call [phone].  If you test positive for Covid 19 in the 2 weeks post procedure, please call and report this information to [place].   ? ?If any biopsies were taken you will be contacted by phone or by letter within the next 1-3 weeks.  Please call us at [phone]  [phone] if you have not heard about the biopsies in 3 weeks.  ? ? ?SIGNATURES/CONFIDENTIALITY: ?You and/or your care partner have signed paperwork which will be entered into your electronic medical record.  These signatures attest to the fact that that the information above on your After Visit Summary has been reviewed and is understood.  Full responsibility of the confidentiality of this discharge information lies with you and/or your care-partner. ? ?

## 2021-12-18 NOTE — Op Note (Signed)
[place] ?Patient Name: Robin Lopez ?Procedure Date: 12/18/2021 2:22 PM ?MRN: [id number] ?Endoscopist: Gerrit Heck , MD ?Age: 41 ?Referring MD:  ?Date of Birth: 1981-03-19 ?Gender: Female ?Account #: [id number] ?Procedure:                Upper GI endoscopy ?Indications:              Iron deficiency anemia, Hematochezia ?Medicines:                Monitored Anesthesia Care ?Procedure:                Pre-Anesthesia Assessment: ?                          - Prior to the procedure, a History and Physical  ?                          was performed, and patient medications and  ?                          allergies were reviewed. The patient's tolerance of  ?                          previous anesthesia was also reviewed. The risks  ?                          and benefits of the procedure and the sedation  ?                          options and risks were discussed with the patient.  ?                          All questions were answered, and informed consent  ?                          was obtained. Prior Anticoagulants: The patient has  ?                          taken no previous anticoagulant or antiplatelet  ?                          agents. ASA Grade Assessment: III - A patient with  ?                          severe systemic disease. After reviewing the risks  ?                          and benefits, the patient was deemed in  ?                          satisfactory condition to undergo the procedure. ?                          After obtaining informed consent, the endoscope was  ?  passed under direct vision. Throughout the  ?                          procedure, the patient's blood pressure, pulse, and  ?                          oxygen saturations were monitored continuously. The  ?                          Endoscope was introduced through the mouth, and  ?                          advanced to the third part of duodenum. The upper  ?                          GI endoscopy  was accomplished without difficulty.  ?                          The patient tolerated the procedure well. ?Scope In: ?Scope Out: ?Findings:                 LA Grade A (one or more mucosal breaks less than 5  ?                          mm, not extending between tops of 2 mucosal folds)  ?                          esophagitis with no bleeding was found at the  ?                          gastroesophageal junction. ?                          The gastroesophageal flap valve was visualized  ?                          endoscopically and classified as Hill Grade III  ?                          (minimal fold, loose to endoscope, hiatal hernia  ?                          likely). ?                          The upper third of the esophagus and middle third  ?                          of the esophagus were normal. ?                          A few small sessile polyps with no bleeding and no  ?  stigmata of recent bleeding were found in the  ?                          gastric fundus and in the gastric body. Several of  ?                          these polyps were removed with a cold biopsy  ?                          forceps for histologic representative evaluation.  ?                          Resection and retrieval were complete. Estimated  ?                          blood loss was minimal. ?                          The incisura, gastric antrum and pylorus were  ?                          normal. ?                          The examined duodenum was normal. Biopsies for  ?                          histology were taken with a cold forceps for  ?                          evaluation of celiac disease. Estimated blood loss  ?                          was minimal. ?Complications:            No immediate complications. ?Estimated Blood Loss:     Estimated blood loss was minimal. ?Impression:               - LA Grade A reflux esophagitis with no bleeding. ?                          - Gastroesophageal flap  valve classified as Hill  ?                          Grade III (minimal fold, loose to endoscope, hiatal  ?                          hernia likely). ?                          - Normal upper third of esophagus and middle third  ?                          of esophagus. ?                          -  A few gastric polyps. Resected and retrieved. ?                          - Normal incisura, antrum and pylorus. ?                          - Normal examined duodenum. Biopsied. ?Recommendation:           - Patient has a contact number available for  ?                          emergencies. The signs and symptoms of potential  ?                          delayed complications were discussed with the  ?                          patient. Return to normal activities tomorrow.  ?                          Written discharge instructions were provided to the  ?                          patient. ?                          - Resume previous diet. ?                          - Continue present medications. ?                          - Await pathology results. ?                          - Perform a colonoscopy today. ?                          - If colonoscopy and biopsies unrevealing, plan for  ?                          further interrogation of the small bowel with video  ?                          capsule endoscopy. ?                          - Use Protonix (pantoprazole) 40 mg PO BID for 6  ?                          weeks to promote mucosal healing of erosive  ?                          esophagitis. Then reduce to 40 mg daily and titrate  ?  to lowest effective dose, or discontinue if no  ?                          reflux symptoms. ?Gerrit Heck, MD ?12/18/2021 3:19:04 PM ?

## 2021-12-18 NOTE — Telephone Encounter (Signed)
Left a message for the pt to return my call in order to schedule visit regarding FMLA.  ?

## 2021-12-18 NOTE — Progress Notes (Signed)
PT taken to PACU. Monitors in place. VSS. Report given to RN. 

## 2021-12-18 NOTE — Progress Notes (Signed)
? ?GASTROENTEROLOGY PROCEDURE H&P NOTE  ? ?Primary Care Physician: ?[name], MD ? ? ? ?Reason for Procedure:  Iron deficiency anemia, hematochezia ? ?Plan:    EGD, colonoscopy ? ?Patient is appropriate for endoscopic procedure(s) in the ambulatory ([facility]) setting. ? ?The nature of the procedure, as well as the risks, benefits, and alternatives were carefully and thoroughly reviewed with the patient. Ample time for discussion and questions allowed. The patient understood, was satisfied, and agreed to proceed.  ? ? ? ?HPI: ?Robin Lopez is a 41 y.o. female who presents for EGD and colonoscopy for evaluation of iron deficiency anemia. ? ?History of DVT.  Recently completed Xarelto.  Currently taking aspirin. ? ?Past Medical History:  ?Diagnosis Date  ? Anemia   ? Left leg DVT ([facility])   ? Menorrhagia   ? ? ?Past Surgical History:  ?Procedure Laterality Date  ? CESAREAN SECTION    ? x 2  ? CHOLECYSTECTOMY    ? TUBAL LIGATION    ? WISDOM TOOTH EXTRACTION    ? ? ?Prior to Admission medications   ?Medication Sig Start Date End Date Taking? Authorizing Provider  ?norethindrone (MICRONOR) 0.35 MG tablet Take 1 tablet by mouth daily. ?Patient not taking: Reported on 11/28/2021    [provider]  ?rivaroxaban (XARELTO) 20 MG TABS tablet Take 20 mg by mouth daily with supper. ?Patient not taking: Reported on 11/28/2021    [provider]  ? ? ?Current Outpatient Medications  ?Medication Sig Dispense Refill  ? norethindrone (MICRONOR) 0.35 MG tablet Take 1 tablet by mouth daily. (Patient not taking: Reported on 11/28/2021)    ? rivaroxaban (XARELTO) 20 MG TABS tablet Take 20 mg by mouth daily with supper. (Patient not taking: Reported on 11/28/2021)    ? ?Current Facility-Administered Medications  ?Medication Dose Route Frequency Provider Last Rate Last Admin  ? 0.9 %  sodium chloride infusion  500 mL Intravenous Once Revia Nghiem V, DO      ? ? ?Allergies as of 12/18/2021 - Review Complete  12/18/2021  ?Allergen Reaction Noted  ? Prednisone Itching and Rash 02/26/2013  ? ? ?Family History  ?Problem Relation Age of Onset  ? Liver cancer Father   ? Hypertension Father   ? CAD Father   ? Deep vein thrombosis Brother   ? Stomach cancer Maternal Grandfather   ? Colon cancer Neg Hx   ? ? ?Social History  ? ?Socioeconomic History  ? Marital status: Divorced  ?  Spouse name: Not on file  ? Number of children: Not on file  ? Years of education: Not on file  ? Highest education level: Not on file  ?Occupational History  ? Not on file  ?Tobacco Use  ? Smoking status: Never  ? Smokeless tobacco: Never  ?Vaping Use  ? Vaping Use: Never used  ?Substance and Sexual Activity  ? Alcohol use: No  ? Drug use: No  ? Sexual activity: Yes  ?  Birth control/protection: Surgical  ?Other Topics Concern  ? Not on file  ?Social History Narrative  ? Not on file  ? ?Social Determinants of Health  ? ?Financial Resource Strain: Not on file  ?Food Insecurity: Not on file  ?Transportation Needs: Not on file  ?Physical Activity: Not on file  ?Stress: Not on file  ?Social Connections: Not on file  ?Intimate Partner Violence: Not on file  ? ? ?Physical Exam: ?Vital signs in last 24 hours: ?'@BP'$  (!) 160/86   Pulse 84  Temp 98.1 ?F (36.7 ?C) (Temporal)   Ht '5\' 6"'$  (1.676 m)   Wt 259 lb (117.5 kg)   SpO2 98%   BMI 41.80 kg/m?  ?GEN: NAD ?EYE: Sclerae anicteric ?ENT: MMM ?CV: Non-tachycardic ?Pulm: CTA b/l ?GI: Soft, NT/ND ?NEURO:  Alert & Oriented x 3 ? ? ?Gerrit Heck, DO ?[facility] ? ? ?12/18/2021 2:28 PM ? ?

## 2021-12-18 NOTE — Progress Notes (Signed)
Agree with the assessment and plan as outlined by Yazmin Lemmon, PA-C. ? ?Garfield Coiner, DO, FACG ? ?

## 2021-12-19 ENCOUNTER — Telehealth: Payer: Self-pay

## 2021-12-19 NOTE — Telephone Encounter (Signed)
Attempted to call pt to schedule f/u appt with Ellouise Newer as requested on 12/18/21 colonoscopy report. Left voicemail for pt to call back.  ?

## 2021-12-20 ENCOUNTER — Telehealth: Payer: Self-pay | Admitting: *Deleted

## 2021-12-20 NOTE — Telephone Encounter (Signed)
?  Follow up Call- ? ? ?  12/18/2021  ?  2:07 PM  ?Call back number  ?Post procedure Call Back phone  # [phone]  ?Permission to leave phone message Yes  ?  ? ?Patient questions: ? ?Message left to call us if necessary. ?

## 2021-12-20 NOTE — Telephone Encounter (Signed)
No answer on second attempt follow up call.  ? ?

## 2021-12-21 ENCOUNTER — Telehealth (INDEPENDENT_AMBULATORY_CARE_PROVIDER_SITE_OTHER): Payer: 59 | Admitting: Family Medicine

## 2021-12-21 ENCOUNTER — Encounter: Payer: Self-pay | Admitting: Family Medicine

## 2021-12-21 VITALS — Ht 66.0 in | Wt 259.0 lb

## 2021-12-21 DIAGNOSIS — D509 Iron deficiency anemia, unspecified: Secondary | ICD-10-CM

## 2021-12-21 NOTE — Progress Notes (Signed)
Patient ID: Robin Lopez, female   DOB: [birth year], 41 y.o.   MRN: [id number]   This visit type was conducted due to national recommendations for restrictions regarding the COVID-19 pandemic in an effort to limit this patient's exposure and mitigate transmission in our community.   Virtual Visit via Video Note  I connected with Cindra Eves on 12/21/21 at  9:45 AM EDT by a video enabled telemedicine application and verified that I am speaking with the correct person using two identifiers.  Location patient: home Location provider:work or home office Persons participating in the virtual visit: patient, provider  I discussed the limitations of evaluation and management by telemedicine and the availability of in person appointments. The patient expressed understanding and agreed to proceed.   HPI:  Bernetha had called over a week ago regarding FMLA paperwork.  She needs this predominantly for intermittent leave.  She has been battling with iron deficiency anemia.  Refer to prior notes for details.  She has history of severe uterine fibroids and was waiting to get hysterectomy.  She developed COVID last fall with subsequent DVT and went on anticoagulant.  Her bleeding increased after this.  She was seen by GYN and placed on hormonal therapy and her vaginal bleeding did stabilize on Micronor.  In early January she developed some hematochezia and was seen by GI.  Did have recent EGD and colonoscopy.  She did have some polyps that were biopsied and pathology pending.  We also went ahead and referred her to hematology and she has gotten some iron infusions.  Because of her multiple visits to specialists and for iron infusions she is needing intermittent leave.  Needs coverage up to 8 times per month although we do anticipate this will improve over the next year.   ROS: See pertinent positives and negatives per HPI.  Past Medical History:  Diagnosis Date   Anemia    Left leg DVT ([facility])     Menorrhagia     Past Surgical History:  Procedure Laterality Date   CESAREAN SECTION     x 2   CHOLECYSTECTOMY     TUBAL LIGATION     WISDOM TOOTH EXTRACTION      Family History  Problem Relation Age of Onset   Liver cancer Father    Hypertension Father    CAD Father    Deep vein thrombosis Brother    Stomach cancer Maternal Grandfather    Colon cancer Neg Hx     SOCIAL HX: Non-smoker   Current Outpatient Medications:    pantoprazole (PROTONIX) 40 MG tablet, Take 1 tablet (40 mg total) by mouth 2 (two) times daily. Take '40mg'$  twice daily for 6 weeks. Then reduce to '40mg'$  daily and titrate to lowest effective dose, or discontinue if no reflux symptoms., Disp: 60 tablet, Rfl: 4  EXAM:  VITALS per patient if applicable:  GENERAL: alert, oriented, appears well and in no acute distress  HEENT: atraumatic, conjunttiva clear, no obvious abnormalities on inspection of external nose and ears  NECK: normal movements of the head and neck  LUNGS: on inspection no signs of respiratory distress, breathing rate appears normal, no obvious gross SOB, gasping or wheezing  CV: no obvious cyanosis  MS: moves all visible extremities without noticeable abnormality  PSYCH/NEURO: pleasant and cooperative, no obvious depression or anxiety, speech and thought processing grossly intact  ASSESSMENT AND PLAN:  Discussed the following assessment and plan:  Iron deficiency anemia.  Recent GI work-up as above.  She has  also seen hematology and had recent iron infusions.  Hopefully her hemoglobin will be improving soon.  -We will complete paperwork for intermittent leave for FMLA.     I discussed the assessment and treatment plan with the patient. The patient was provided an opportunity to ask questions and all were answered. The patient agreed with the plan and demonstrated an understanding of the instructions.   The patient was advised to call back or seek an in-person evaluation if the  symptoms worsen or if the condition fails to improve as anticipated.     Carolann Littler, MD

## 2021-12-26 ENCOUNTER — Encounter: Payer: Self-pay | Admitting: Family Medicine

## 2021-12-26 ENCOUNTER — Encounter: Payer: Self-pay | Admitting: Gastroenterology

## 2021-12-26 NOTE — Telephone Encounter (Signed)
Spoke with pt and scheduled pt for appt with Ellouise Newer on 5/24 at 11 am. Pt verbalized understanding and had no other concerns at end of call.  ?

## 2021-12-26 NOTE — Telephone Encounter (Signed)
Left message for pt to call back  °

## 2022-01-03 ENCOUNTER — Inpatient Hospital Stay: Payer: 59 | Attending: Hematology and Oncology | Admitting: Hematology and Oncology

## 2022-01-03 ENCOUNTER — Other Ambulatory Visit: Payer: Self-pay

## 2022-01-03 ENCOUNTER — Encounter: Payer: Self-pay | Admitting: Hematology and Oncology

## 2022-01-03 ENCOUNTER — Inpatient Hospital Stay: Payer: 59

## 2022-01-03 VITALS — BP 131/74 | HR 73 | Temp 97.5°F | Resp 18 | Ht 66.0 in | Wt 245.2 lb

## 2022-01-03 DIAGNOSIS — D259 Leiomyoma of uterus, unspecified: Secondary | ICD-10-CM | POA: Diagnosis present

## 2022-01-03 DIAGNOSIS — I82442 Acute embolism and thrombosis of left tibial vein: Secondary | ICD-10-CM | POA: Diagnosis present

## 2022-01-03 DIAGNOSIS — D500 Iron deficiency anemia secondary to blood loss (chronic): Secondary | ICD-10-CM | POA: Diagnosis present

## 2022-01-03 DIAGNOSIS — D219 Benign neoplasm of connective and other soft tissue, unspecified: Secondary | ICD-10-CM | POA: Diagnosis not present

## 2022-01-03 LAB — CBC WITH DIFFERENTIAL/PLATELET
Abs Immature Granulocytes: 0.07 10*3/uL (ref 0.00–0.07)
Basophils Absolute: 0.1 10*3/uL (ref 0.0–0.1)
Basophils Relative: 1 %
Eosinophils Absolute: 0.2 10*3/uL (ref 0.0–0.5)
Eosinophils Relative: 1 %
HCT: 41.3 % (ref 36.0–46.0)
Hemoglobin: 13.6 g/dL (ref 12.0–15.0)
Immature Granulocytes: 1 %
Lymphocytes Relative: 16 %
Lymphs Abs: 2.3 10*3/uL (ref 0.7–4.0)
MCH: 26.2 pg (ref 26.0–34.0)
MCHC: 32.9 g/dL (ref 30.0–36.0)
MCV: 79.6 fL — ABNORMAL LOW (ref 80.0–100.0)
Monocytes Absolute: 0.8 10*3/uL (ref 0.1–1.0)
Monocytes Relative: 6 %
Neutro Abs: 10.9 10*3/uL — ABNORMAL HIGH (ref 1.7–7.7)
Neutrophils Relative %: 75 %
Platelets: 316 10*3/uL (ref 150–400)
RBC: 5.19 MIL/uL — ABNORMAL HIGH (ref 3.87–5.11)
RDW: 21 % — ABNORMAL HIGH (ref 11.5–15.5)
WBC: 14.4 10*3/uL — ABNORMAL HIGH (ref 4.0–10.5)
nRBC: 0 % (ref 0.0–0.2)

## 2022-01-03 LAB — IRON AND IRON BINDING CAPACITY (CC-WL,HP ONLY)
Iron: 65 ug/dL (ref 28–170)
Saturation Ratios: 23 % (ref 10.4–31.8)
TIBC: 280 ug/dL (ref 250–450)
UIBC: 215 ug/dL (ref 148–442)

## 2022-01-03 LAB — ABO/RH: ABO/RH(D): A POS

## 2022-01-03 LAB — FERRITIN: Ferritin: 126 ng/mL (ref 11–307)

## 2022-01-03 NOTE — Assessment & Plan Note (Signed)
From the anticoagulation standpoint, she has completed 6 months worth of anticoagulation therapy ?Repeat venous Doppler ultrasound on January 13 and March 8 showed no evidence of DVT ?Her anticoagulation therapy has been discontinued ?She does not need further work-up for long-term follow-up in this regard ?

## 2022-01-03 NOTE — Assessment & Plan Note (Signed)
Since discontinuation of Xarelto and intravenous iron infusion, her anemia has resolved ?We discussed long-term follow-up ?I will see her again in 3 months for further follow-up and repeat iron studies ?

## 2022-01-03 NOTE — Progress Notes (Signed)
? ?[facility] ?OFFICE PROGRESS NOTE ? ?[name], MD ? ?ASSESSMENT & PLAN:  ?Iron deficiency anemia ?Since discontinuation of Xarelto and intravenous iron infusion, her anemia has resolved ?We discussed long-term follow-up ?I will see her again in 3 months for further follow-up and repeat iron studies ? ?Fibroids ?She denies recent excessive menorrhagia ?I would defer to her gynecologist for management of uterine fibroids and heavy menstruation ? ?Acute DVT of left tibial vein ([facility]) ?From the anticoagulation standpoint, she has completed 6 months worth of anticoagulation therapy ?Repeat venous Doppler ultrasound on January 13 and March 8 showed no evidence of DVT ?Her anticoagulation therapy has been discontinued ?She does not need further work-up for long-term follow-up in this regard ? ?Orders Placed This Encounter  ?Procedures  ? Iron and Iron Binding Capacity ([facility] only)  ?  Standing Status:   Future  ?  Standing Expiration Date:   01/04/2023  ? Ferritin  ?  Standing Status:   Future  ?  Standing Expiration Date:   01/03/2023  ? ? ?The total time spent in the appointment was 20 minutes encounter with patients including review of chart and various tests results, discussions about plan of care and coordination of care plan ? ? All questions were answered. The patient knows to call the clinic with any problems, questions or concerns. No barriers to learning was detected. ? ? ? Heath Lark, MD ?5/4/202311:37 AM ? ?INTERVAL HISTORY: ?Robin Lopez 41 y.o. female returns for follow-up on history of iron deficiency anemia due to chronic blood loss and history of DVT ?The patient underwent EGD and colonoscopy recently ?She was started on pantoprazole due to evidence of esophagitis ?She tolerated intravenous iron infusion well ?She denies recent excessive heavy menstruation ? ?SUMMARY OF HEMATOLOGIC HISTORY: ?Robin Lopez 41 y.o. female is here because of severe iron deficiency anemia  secondary to menorrhagia, in the setting of anticoagulation therapy for history of DVT ? ?She was found to have abnormal CBC from recent blood work ?The patient had COVID infection last year and soon after that, developed left lower extremity DVT ? ?On May 12, 2021, she had bilateral lower extremity venous Doppler which showed  ?RIGHT:  ?- No evidence of common femoral vein obstruction.  ?   ?LEFT:  ?- Findings consistent with acute deep vein thrombosis involving the left posterior tibial veins.  ? ?She was anticoagulated with Xarelto ?The patient also have significant menorrhagia secondary to uterine fibroid to the point she needed blood transfusion in September 2022 ?She remained on anticoagulation therapy since then ?She is compliant taking her medications ?She had recurrent ER visit in January for GI bleed ?Due to severe anemia, the patient was placed on birth control pill to control menorrhagia and she has not have any menstrual cycle since then ?There is plan for partial hysterectomy but due to insurance situation, she has not been scheduled ?She had 2 other ultrasound evaluation in January and March of this year which showed complete resolution of DVT ?She also had CT imaging of the chest which rule out PE ?She had numerous surgeries in the past, 2 pregnancies and prior history of birth control pills without clotting disorders or recurrent DVT ?She stated 1 brother had DVT but she does not know the circumstances leading to his DVT ?The patient does not smoke ?She does have chronic immobility due to her job sitting 8 to 10 hours/day ?She also admits she is not drinking enough fluids ? ?She had not  noticed any recent bleeding such as hematuria but she had 1 episode of epistaxis 1 to 2 months ago.  Due to her history of passage of bright red blood per rectum, she is scheduled to see GI service and of the month ?The patient denies over the counter NSAID  ?She had no prior history or diagnosis of cancer. Her  age appropriate screening programs are up-to-date. ?She has pica with ice craving; she eats a variety of diet. ?The patient was prescribed oral iron supplements and she takes daily but that caused severe constipation and stomach upset ?Anticoagulation was discontinued in March 2023 ?She received 2 doses of intravenous iron sucrose ?On 12/18/21: ?Colonoscopy revealed ?Hemorrhoids found on perianal exam. ?- One 2 mm polyp in the ascending colon, removed with a cold snare. Resected and retrieved. ?- Two 3 to 6 mm polyps in the sigmoid colon and in the descending colon, removed with a cold snare. Resected and retrieved. ?- Non-bleeding internal hemorrhoids. ?- The examined portion of the ileum was normal. ?EGD revealed ?- LA Grade A reflux esophagitis with no bleeding. ?- Gastroesophageal flap valve classified as Hill Grade III (minimal fold, loose to endoscope hiatal hernia likely). ?- Normal upper third of esophagus and middle third of esophagus. ?- A few gastric polyps. Resected and retrieved. ?- Normal incisura, antrum and pylorus. ?- Normal examined duodenum. Biopsied. ? ?I have reviewed the past medical history, past surgical history, social history and family history with the patient and they are unchanged from previous note. ? ?ALLERGIES:  is allergic to prednisone. ? ?MEDICATIONS:  ?Current Outpatient Medications  ?Medication Sig Dispense Refill  ? pantoprazole (PROTONIX) 40 MG tablet Take 1 tablet (40 mg total) by mouth 2 (two) times daily. Take '40mg'$  twice daily for 6 weeks. Then reduce to '40mg'$  daily and titrate to lowest effective dose, or discontinue if no reflux symptoms. 60 tablet 4  ? ?No current facility-administered medications for this visit.  ?  ? ?REVIEW OF SYSTEMS:   ?Constitutional: Denies fevers, chills or night sweats ?Eyes: Denies blurriness of vision ?Ears, nose, mouth, throat, and face: Denies mucositis or sore throat ?Respiratory: Denies cough, dyspnea or wheezes ?Cardiovascular: Denies  palpitation, chest discomfort or lower extremity swelling ?Gastrointestinal:  Denies nausea, heartburn or change in bowel habits ?Skin: Denies abnormal skin rashes ?Lymphatics: Denies new lymphadenopathy or easy bruising ?Neurological:Denies numbness, tingling or new weaknesses ?Behavioral/Psych: Mood is stable, no new changes  ?All other systems were reviewed with the patient and are negative. ? ?PHYSICAL EXAMINATION: ?ECOG PERFORMANCE STATUS: 0 - Asymptomatic ? ?Vitals:  ? 01/03/22 1001  ?BP: 131/74  ?Pulse: 73  ?Resp: 18  ?Temp: (!) 97.5 ?F (36.4 ?C)  ?SpO2: 100%  ? ?Filed Weights  ? 01/03/22 1001  ?Weight: 245 lb 3.2 oz (111.2 kg)  ? ? ?GENERAL:alert, no distress and comfortable ? ?NEURO: alert & oriented x 3 with fluent speech, no focal motor/sensory deficits ? ?LABORATORY DATA:  ?I have reviewed the data as listed ? ?   ?Component Value Date/Time  ? NA 136 11/07/2021 1113  ? NA 141 07/06/2020 1514  ? NA 138 10/06/2012 1348  ? K 4.1 11/07/2021 1113  ? K 4.1 10/06/2012 1348  ? CL 105 11/07/2021 1113  ? CL 106 10/06/2012 1348  ? CO2 22 11/07/2021 1113  ? CO2 28 10/06/2012 1348  ? GLUCOSE 164 (H) 11/07/2021 1113  ? GLUCOSE 76 10/06/2012 1348  ? BUN 11 11/07/2021 1113  ? BUN 13 07/06/2020 1514  ? BUN  11 10/06/2012 1348  ? CREATININE 0.77 11/07/2021 1113  ? CREATININE 0.75 10/06/2012 1348  ? CALCIUM 8.7 (L) 11/07/2021 1113  ? CALCIUM 8.8 10/06/2012 1348  ? PROT 7.4 09/07/2021 0042  ? ALBUMIN 4.0 09/07/2021 0042  ? AST 23 09/07/2021 0042  ? ALT 19 09/07/2021 0042  ? ALKPHOS 151 (H) 09/07/2021 0042  ? BILITOT 0.5 09/07/2021 0042  ? GFRNONAA >60 11/07/2021 1113  ? GFRNONAA >60 10/06/2012 1348  ? GFRAA 88 07/06/2020 1514  ? GFRAA >60 10/06/2012 1348  ? ? ?No results found for: SPEP, UPEP ? ?Lab Results  ?Component Value Date  ? WBC 14.4 (H) 01/03/2022  ? NEUTROABS 10.9 (H) 01/03/2022  ? HGB 13.6 01/03/2022  ? HCT 41.3 01/03/2022  ? MCV 79.6 (L) 01/03/2022  ? PLT 316 01/03/2022  ? ? ?  Chemistry   ?   ?Component Value  Date/Time  ? NA 136 11/07/2021 1113  ? NA 141 07/06/2020 1514  ? NA 138 10/06/2012 1348  ? K 4.1 11/07/2021 1113  ? K 4.1 10/06/2012 1348  ? CL 105 11/07/2021 1113  ? CL 106 10/06/2012 1348  ? CO2 22 11/07/2021 111

## 2022-01-03 NOTE — Assessment & Plan Note (Signed)
She denies recent excessive menorrhagia ?I would defer to her gynecologist for management of uterine fibroids and heavy menstruation ?

## 2022-01-04 ENCOUNTER — Telehealth: Payer: Self-pay

## 2022-01-04 NOTE — Telephone Encounter (Signed)
No voicemail. Sent her a Therapist, music. ?

## 2022-01-04 NOTE — Telephone Encounter (Signed)
-----   Message from Heath Lark, MD sent at 01/04/2022  8:04 AM EDT ----- ?Pls let her know ferritin level is good ? ?

## 2022-01-04 NOTE — Telephone Encounter (Signed)
Attempted to call, unable to leave a message

## 2022-01-23 ENCOUNTER — Encounter: Payer: Self-pay | Admitting: Physician Assistant

## 2022-01-23 ENCOUNTER — Ambulatory Visit (INDEPENDENT_AMBULATORY_CARE_PROVIDER_SITE_OTHER): Payer: 59 | Admitting: Physician Assistant

## 2022-01-23 VITALS — BP 126/80 | HR 77 | Ht 66.0 in | Wt 250.0 lb

## 2022-01-23 DIAGNOSIS — D122 Benign neoplasm of ascending colon: Secondary | ICD-10-CM | POA: Diagnosis not present

## 2022-01-23 DIAGNOSIS — D509 Iron deficiency anemia, unspecified: Secondary | ICD-10-CM | POA: Diagnosis not present

## 2022-01-23 DIAGNOSIS — K2100 Gastro-esophageal reflux disease with esophagitis, without bleeding: Secondary | ICD-10-CM

## 2022-01-23 NOTE — Progress Notes (Signed)
Chief Complaint: Follow-up iron deficiency anemia  HPI: Robin Lopez is a 41 year old female, assigned to Dr. Havery Moros, with a past medical history as listed below including DVT on Xarelto until just recently, who returns to clinic today for follow-up of her IDA.    09/06/2021 patient seen in the ED for melena.  Patient describes chronic left-sided abdominal pain secondary to fibroids and a couple days ago had noticed some bright red blood entwined in her stool with multiple bowel movements also some maroon blood.    09/07/2021 CT of the abdomen pelvis with contrast for left lower quadrant pain with no acute abnormality and enlarged fibroid uterus as well as mild hepatosplenomegaly.    09/07/2021 CMP with decreased sodium at 131 and minimally elevated alk phos at 151.  Lipase normal.  INR elevated at 2.0.  CBC with a hemoglobin of 10.8 (seems to be around this over the past 4 months).  White count elevated at 15.3.  Platelets elevated at 414.    09/11/2021 patient saw her PCP.  At that time discussed she had menorrhagia and saw GYN several months ago and placed on hormonal therapy which is slowed down her menstrual loss somewhat but she had recently had a fairly heavy bleed.  Also had COVID several months ago and subsequent DVT and was placed on Xarelto.  Plans are for hysterectomy in March, had noted some bright red blood in her stool couple days the week before.  Follow-up labs showed stable hemoglobin at 10.8.    10/12/2021 patient seen in clinic for her iron deficiency anemia, at that time suspected this was from heavy manses related to large fibroid, there were plans for hysterectomy in the coming months and she wanted to wait for further evaluation until after that.  Did describe occasional bright red blood in her stool over the past couple of years.  At that time recommended EGD and colonoscopy but patient wanted to wait until after her hysterectomy.  Patient was started on Pepcid 40 mg daily for reflux  symptoms.    Since last visit above patient has followed with PCP and has been on medication for her fibroids and heavy menstrual bleeding and has had little to no menstrual bleeding over the past few months but iron remains low, so that she went to see hematology for iron infusions just last week.  Xarelto was also discussed and patient is going to come off this and be on just Aspirin.  I was contacted by her PCP due to concerns for continued iron deficiency anemia and want a procedure sooner.    11/12/2021 CBC with hemoglobin of 10.1 (seems fairly stable over the past 3 months).  Iron studies that day with an iron low at 18 and percent saturation low at 6.    11/20/2021 patient saw Dr. Alvy Bimler with hematology and at that time discussed that a repeat venous Doppler on January 13 and March 8 showed no evidence of DVT and patient was going to discontinue her anticoagulation therapy and stay on Aspirin going forward.  It was noted that her iron was critically low and she was given IV iron infusion.  Discussed this could be from her uterine fibroid or chronic blood loss/malabsorption.    11/22/2021 patient seen in clinic.  At that time continued to discuss her iron deficiency anemia.  She was scheduled for an EGD and colonoscopy with Dr. Bryan Lemma as Dr. Havery Moros not have availability.    12/18/2021 colonoscopy with hemorrhoids and one 2 mm  polyp in the ascending colon, two 3-6 mm polyps in the sigmoid colon, two 3-6 mm polyps in the descending colon, nonbleeding internal hemorrhoids.  It was discussed that her internal hemorrhoids may be amenable to banding if she was interested in the future.  Pathology showed 1 benign inflammatory polyp and the other 2 were a tubular adenoma and a sessile serrated polyp.  Was recommended she repeat in 5 years.    12/18/2021 EGD with LA grade a reflux esophagitis and fundic gland polyps.  At that time recommend that she go on Pantoprazole 40 twice daily x6 weeks and then 40 mg  daily and titrate to lowest effective dose for no further reflux symptoms.  It was recommended she have a small bowel video capsule endoscopy.    01/03/2022 patient was seen in follow-up by hematology/oncology and at that time they discussed that since discontinuation of Xarelto and IV iron infusions her anemia had resolved.  They are following with her in 3 months.    01/03/2022 CBC, iron studies and ferritin were all normal.    Today, the patient tells me that she is not having any problems.  She was told by hematology and oncology that her anemia was better.  She is taking the Pantoprazole 40 mg twice a day as prescribed and asked some questions about pathology results from the polyps in her colon.  She has no acute complaints or concerns.    Her son recently graduated from college.    Denies fever, chills, weight loss, abdominal pain, heartburn, reflux or change in bowel habits.  Past Medical History:  Diagnosis Date   Anemia    Left leg DVT (HCC)    Menorrhagia     Past Surgical History:  Procedure Laterality Date   CESAREAN SECTION     x 2   CHOLECYSTECTOMY     TUBAL LIGATION     WISDOM TOOTH EXTRACTION      Current Outpatient Medications  Medication Sig Dispense Refill   pantoprazole (PROTONIX) 40 MG tablet Take 1 tablet (40 mg total) by mouth 2 (two) times daily. Take 83m twice daily for 6 weeks. Then reduce to 449mdaily and titrate to lowest effective dose, or discontinue if no reflux symptoms. 60 tablet 4   No current facility-administered medications for this visit.    Allergies as of 01/23/2022 - Review Complete 01/23/2022  Allergen Reaction Noted   Prednisone Itching and Rash 02/26/2013    Family History  Problem Relation Age of Onset   Liver cancer Father    Hypertension Father    CAD Father    Deep vein thrombosis Brother    Stomach cancer Maternal Grandfather    Colon cancer Neg Hx     Social History   Socioeconomic History   Marital status: Divorced     Spouse name: Not on file   Number of children: Not on file   Years of education: Not on file   Highest education level: Not on file  Occupational History   Not on file  Tobacco Use   Smoking status: Never   Smokeless tobacco: Never  Vaping Use   Vaping Use: Never used  Substance and Sexual Activity   Alcohol use: No   Drug use: No   Sexual activity: Yes    Birth control/protection: Surgical  Other Topics Concern   Not on file  Social History Narrative   Not on file   Social Determinants of Health   Financial Resource Strain: Not on  file  Food Insecurity: Not on file  Transportation Needs: Not on file  Physical Activity: Not on file  Stress: Not on file  Social Connections: Not on file  Intimate Partner Violence: Not on file    Review of Systems:    Constitutional: No weight loss, fever or chills Cardiovascular: No chest pain   Respiratory: No SOB  Gastrointestinal: See HPI and otherwise negative   Physical Exam:  Vital signs: BP 126/80   Pulse 77   Ht '5\' 6"'  (1.676 m)   Wt 250 lb (113.4 kg)   BMI 40.35 kg/m   Constitutional:   Pleasant Caucasian female appears to be in NAD, Well developed, Well nourished, alert and cooperative Respiratory: Respirations even and unlabored. Lungs clear to auscultation bilaterally.   No wheezes, crackles, or rhonchi.  Cardiovascular: Normal S1, S2. No MRG. Regular rate and rhythm. No peripheral edema, cyanosis or pallor.  Gastrointestinal:  Soft, nondistended, nontender. No rebound or guarding. Normal bowel sounds. No appreciable masses or hepatomegaly. Rectal:  Not performed.  Psychiatric: Oriented to person, place and time. Demonstrates good judgement and reason without abnormal affect or behaviors.  RELEVANT LABS AND IMAGING: CBC    Component Value Date/Time   WBC 14.4 (H) 01/03/2022 0932   RBC 5.19 (H) 01/03/2022 0932   HGB 13.6 01/03/2022 0932   HGB 12.9 02/16/2013 1328   HCT 41.3 01/03/2022 0932   HCT 37.4 02/16/2013  1328   PLT 316 01/03/2022 0932   PLT 406 02/16/2013 1328   MCV 79.6 (L) 01/03/2022 0932   MCV 81 02/16/2013 1328   MCH 26.2 01/03/2022 0932   MCHC 32.9 01/03/2022 0932   RDW 21.0 (H) 01/03/2022 0932   RDW 15.3 (H) 02/16/2013 1328   LYMPHSABS 2.3 01/03/2022 0932   LYMPHSABS 3.2 10/06/2012 1348   MONOABS 0.8 01/03/2022 0932   MONOABS 0.6 10/06/2012 1348   EOSABS 0.2 01/03/2022 0932   EOSABS 0.2 10/06/2012 1348   BASOSABS 0.1 01/03/2022 0932   BASOSABS 0.1 10/06/2012 1348    CMP     Component Value Date/Time   NA 136 11/07/2021 1113   NA 141 07/06/2020 1514   NA 138 10/06/2012 1348   K 4.1 11/07/2021 1113   K 4.1 10/06/2012 1348   CL 105 11/07/2021 1113   CL 106 10/06/2012 1348   CO2 22 11/07/2021 1113   CO2 28 10/06/2012 1348   GLUCOSE 164 (H) 11/07/2021 1113   GLUCOSE 76 10/06/2012 1348   BUN 11 11/07/2021 1113   BUN 13 07/06/2020 1514   BUN 11 10/06/2012 1348   CREATININE 0.77 11/07/2021 1113   CREATININE 0.75 10/06/2012 1348   CALCIUM 8.7 (L) 11/07/2021 1113   CALCIUM 8.8 10/06/2012 1348   PROT 7.4 09/07/2021 0042   ALBUMIN 4.0 09/07/2021 0042   AST 23 09/07/2021 0042   ALT 19 09/07/2021 0042   ALKPHOS 151 (H) 09/07/2021 0042   BILITOT 0.5 09/07/2021 0042   GFRNONAA >60 11/07/2021 1113   GFRNONAA >60 10/06/2012 1348   GFRAA 88 07/06/2020 1514   GFRAA >60 10/06/2012 1348    Assessment: 1.  Iron deficiency anemia: Occurred while the patient was on Xarelto after her DVT, she is now off the Xarelto and following with hematology/oncology with recently normal CBC, iron studies and ferritin on 01/03/2022.  She did undergo EGD and colonoscopy as part of her work-up with no source for anemia found.  Recommendations were for a small bowel follow-through if anemia continued.  Patient doing well. 2.  Adenomatous polyps: Found at time of recent colonoscopy with repeat recommended in 5 years 3.  Esophagitis: Found at time of EGD, LA grade a class, told to stay on twice daily  Pantoprazole 40 mg twice daily for 6 weeks and then decrease to 40 mg daily and titrate down as she could  Plan: 1.  We discussed pathology from polyps at time of her recent colonoscopy and recommendations for repeat colonoscopy in 5 years. 2.  Discussed esophagitis and recommendations to stay on Pantoprazole 40 mg twice daily for 6 weeks and then decrease to 40 mg daily.  Discussed that she should stay on the 40 mg for at least another month or so and then she can try to come down off medication.  Told her to call if she has any problems. 3.  Discussed iron deficiency anemia and recent labs which were normal.  Likely the Xarelto was exacerbating a small bleed somewhere, but I do not think we need to evaluate further at this time given that she does not need the Xarelto and her labs look normal.  If the anemia recurred then would recommend a small bowel pill capsule endoscopy for further evaluation.  Discussed this with her. 4.  Patient to follow in clinic with [place] as needed.  Robin Newer, PA-C [facility] 01/23/2022, 11:15 AM  Cc: [name], MD

## 2022-01-23 NOTE — Patient Instructions (Signed)
If you are age 41 or younger, your body mass index should be between 19-25. Your Body mass index is 40.35 kg/m. If this is out of the aformentioned range listed, please consider follow up with your Primary Care Provider.  ________________________________________________________  The [facility] providers would like to encourage you to use [facility] to communicate with providers for non-urgent requests or questions.  Due to long hold times on the telephone, sending your provider a message by [facility] may be a faster and more efficient way to get a response.  Please allow 48 business hours for a response.  Please remember that this is for non-urgent requests.  _______________________________________________________  Robin Lopez will follow up in our office on an as needed basis.  Thank you for entrusting me with your care and choosing [facility].  Ellouise Newer, PA-C

## 2022-01-24 NOTE — Progress Notes (Signed)
Agree with assessment and plan as outlined.  Endoscopic evaluation completed without any high risk pathology, and CT scan abdomen pelvis did not show any concerning pathology of her small bowel.  She responded appropriately to iron and her anemia resolved.  If she has recurrence of iron deficiency moving forward would pursue capsule endoscopy to clear her small intestine, we will need to keep a close eye on her CBC and iron studies moving forward.

## 2022-03-28 ENCOUNTER — Encounter: Payer: Self-pay | Admitting: Physician Assistant

## 2022-04-08 ENCOUNTER — Encounter: Payer: Self-pay | Admitting: Hematology and Oncology

## 2022-04-08 ENCOUNTER — Other Ambulatory Visit: Payer: Self-pay

## 2022-04-08 ENCOUNTER — Inpatient Hospital Stay: Payer: 59

## 2022-04-08 ENCOUNTER — Inpatient Hospital Stay: Payer: 59 | Attending: Hematology and Oncology | Admitting: Hematology and Oncology

## 2022-04-08 DIAGNOSIS — D72829 Elevated white blood cell count, unspecified: Secondary | ICD-10-CM | POA: Diagnosis not present

## 2022-04-08 DIAGNOSIS — I82442 Acute embolism and thrombosis of left tibial vein: Secondary | ICD-10-CM | POA: Insufficient documentation

## 2022-04-08 DIAGNOSIS — Z86718 Personal history of other venous thrombosis and embolism: Secondary | ICD-10-CM

## 2022-04-08 DIAGNOSIS — D500 Iron deficiency anemia secondary to blood loss (chronic): Secondary | ICD-10-CM | POA: Diagnosis present

## 2022-04-08 DIAGNOSIS — Z7901 Long term (current) use of anticoagulants: Secondary | ICD-10-CM | POA: Diagnosis not present

## 2022-04-08 DIAGNOSIS — D259 Leiomyoma of uterus, unspecified: Secondary | ICD-10-CM | POA: Insufficient documentation

## 2022-04-08 LAB — CBC WITH DIFFERENTIAL/PLATELET
Abs Immature Granulocytes: 0.11 K/uL — ABNORMAL HIGH (ref 0.00–0.07)
Basophils Absolute: 0.1 K/uL (ref 0.0–0.1)
Basophils Relative: 1 %
Eosinophils Absolute: 0.2 K/uL (ref 0.0–0.5)
Eosinophils Relative: 1 %
HCT: 40.5 % (ref 36.0–46.0)
Hemoglobin: 14.4 g/dL (ref 12.0–15.0)
Immature Granulocytes: 1 %
Lymphocytes Relative: 16 %
Lymphs Abs: 2.3 K/uL (ref 0.7–4.0)
MCH: 29.8 pg (ref 26.0–34.0)
MCHC: 35.6 g/dL (ref 30.0–36.0)
MCV: 83.9 fL (ref 80.0–100.0)
Monocytes Absolute: 0.7 K/uL (ref 0.1–1.0)
Monocytes Relative: 5 %
Neutro Abs: 11 K/uL — ABNORMAL HIGH (ref 1.7–7.7)
Neutrophils Relative %: 76 %
Platelets: 302 K/uL (ref 150–400)
RBC: 4.83 MIL/uL (ref 3.87–5.11)
RDW: 13.6 % (ref 11.5–15.5)
WBC: 14.4 K/uL — ABNORMAL HIGH (ref 4.0–10.5)
nRBC: 0 % (ref 0.0–0.2)

## 2022-04-08 LAB — IRON AND IRON BINDING CAPACITY (CC-WL,HP ONLY)
Iron: 112 ug/dL (ref 28–170)
Saturation Ratios: 42 % — ABNORMAL HIGH (ref 10.4–31.8)
TIBC: 265 ug/dL (ref 250–450)
UIBC: 153 ug/dL (ref 148–442)

## 2022-04-08 LAB — FERRITIN: Ferritin: 103 ng/mL (ref 11–307)

## 2022-04-08 NOTE — Progress Notes (Signed)
[name]  Burchette, Alinda Sierras, MD  ASSESSMENT & PLAN:  Iron deficiency anemia Since discontinuation of Xarelto and intravenous iron infusion, her anemia has resolved She does not need long-term follow-up  History of DVT (deep vein thrombosis) From the anticoagulation standpoint, she has completed 6 months worth of anticoagulation therapy Repeat venous Doppler ultrasound on January 13 and March 8 showed no evidence of DVT Her anticoagulation therapy has been discontinued She does not need further work-up for long-term follow-up in this regard I will see her in the future if she needs perioperative anticoagulation management  Leukocytosis She has chronic leukocytosis intermittently Cause is unknown but could be related to body habitus She does not need long-term follow-up  No orders of the defined types were placed in this encounter.   The total time spent in the appointment was 20 minutes encounter with patients including review of chart and various tests results, discussions about plan of care and coordination of care plan   All questions were answered. The patient knows to call the clinic with any problems, questions or concerns. No barriers to learning was detected.    Robin Lark, MD 8/7/20231:57 PM  INTERVAL HISTORY: Robin Lopez 41 y.o. female returns for follow-up for history of iron deficiency anemia and DVT She is doing well She stated she is hydrating herself adequately No recent infection, fever or chills Denies recent bleed  SUMMARY OF HEMATOLOGIC HISTORY: Robin Lopez 41 y.o. female is here because of severe iron deficiency anemia secondary to menorrhagia, in the setting of anticoagulation therapy for history of DVT  She was found to have abnormal CBC from recent blood work The patient had COVID infection last year and soon after that, developed left lower extremity DVT  On May 12, 2021, she had bilateral  lower extremity venous Doppler which showed  RIGHT:  - No evidence of common femoral vein obstruction.     LEFT:  - Findings consistent with acute deep vein thrombosis involving the left posterior tibial veins.   She was anticoagulated with Xarelto The patient also have significant menorrhagia secondary to uterine fibroid to the point she needed blood transfusion in September 2022 She remained on anticoagulation therapy since then She is compliant taking her medications She had recurrent ER visit in January for GI bleed Due to severe anemia, the patient was placed on birth control pill to control menorrhagia and she has not have any menstrual cycle since then There is plan for partial hysterectomy but due to insurance situation, she has not been scheduled She had 2 other ultrasound evaluation in January and March of this year which showed complete resolution of DVT She also had CT imaging of the chest which rule out PE She had numerous surgeries in the past, 2 pregnancies and prior history of birth control pills without clotting disorders or recurrent DVT She stated 1 brother had DVT but she does not know the circumstances leading to his DVT The patient does not smoke She does have chronic immobility due to her job sitting 8 to 10 hours/day She also admits she is not drinking enough fluids  She had not noticed any recent bleeding such as hematuria but she had 1 episode of epistaxis 1 to 2 months ago.  Due to her history of passage of bright red blood per rectum, she is scheduled to see GI service and of the month The patient denies over the counter NSAID  She had no prior history or  diagnosis of cancer. Her age appropriate screening programs are up-to-date. She has pica with ice craving; she eats a variety of diet. The patient was prescribed oral iron supplements and she takes daily but that caused severe constipation and stomach upset Anticoagulation was discontinued in March 2023 She  received 2 doses of intravenous iron sucrose On 12/18/21: Colonoscopy revealed Hemorrhoids found on perianal exam. - One 2 mm polyp in the ascending colon, removed with a cold snare. Resected and retrieved. - Two 3 to 6 mm polyps in the sigmoid colon and in the descending colon, removed with a cold snare. Resected and retrieved. - Non-bleeding internal hemorrhoids. - The examined portion of the ileum was normal. EGD revealed - LA Grade A reflux esophagitis with no bleeding. - Gastroesophageal flap valve classified as Hill Grade III (minimal fold, loose to endoscope hiatal hernia likely). - Normal upper third of esophagus and middle third of esophagus. - A few gastric polyps. Resected and retrieved. - Normal incisura, antrum and pylorus. - Normal examined duodenum. Biopsied  I have reviewed the past medical history, past surgical history, social history and family history with the patient and they are unchanged from previous note.  ALLERGIES:  is allergic to prednisone.  MEDICATIONS:  No current outpatient medications on file.   No current facility-administered medications for this visit.     REVIEW OF SYSTEMS:   Constitutional: Denies fevers, chills or night sweats Eyes: Denies blurriness of vision Ears, nose, mouth, throat, and face: Denies mucositis or sore throat Respiratory: Denies cough, dyspnea or wheezes Cardiovascular: Denies palpitation, chest discomfort or lower extremity swelling Gastrointestinal:  Denies nausea, heartburn or change in bowel habits Skin: Denies abnormal skin rashes Lymphatics: Denies new lymphadenopathy or easy bruising Neurological:Denies numbness, tingling or new weaknesses Behavioral/Psych: Mood is stable, no new changes  All other systems were reviewed with the patient and are negative.  PHYSICAL EXAMINATION: ECOG PERFORMANCE STATUS: 0 - Asymptomatic  Vitals:   04/08/22 1029  BP: 137/79  Pulse: 74  Resp: 18  SpO2: 99%   Filed Weights    04/08/22 1029  Weight: 245 lb 12.8 oz (111.5 kg)    GENERAL:alert, no distress and comfortable NEURO: alert & oriented x 3 with fluent speech, no focal motor/sensory deficits  LABORATORY DATA:  I have reviewed the data as listed     Component Value Date/Time   NA 136 11/07/2021 1113   NA 141 07/06/2020 1514   NA 138 10/06/2012 1348   K 4.1 11/07/2021 1113   K 4.1 10/06/2012 1348   CL 105 11/07/2021 1113   CL 106 10/06/2012 1348   CO2 22 11/07/2021 1113   CO2 28 10/06/2012 1348   GLUCOSE 164 (H) 11/07/2021 1113   GLUCOSE 76 10/06/2012 1348   BUN 11 11/07/2021 1113   BUN 13 07/06/2020 1514   BUN 11 10/06/2012 1348   CREATININE 0.77 11/07/2021 1113   CREATININE 0.75 10/06/2012 1348   CALCIUM 8.7 (L) 11/07/2021 1113   CALCIUM 8.8 10/06/2012 1348   PROT 7.4 09/07/2021 0042   ALBUMIN 4.0 09/07/2021 0042   AST 23 09/07/2021 0042   ALT 19 09/07/2021 0042   ALKPHOS 151 (H) 09/07/2021 0042   BILITOT 0.5 09/07/2021 0042   GFRNONAA >60 11/07/2021 1113   GFRNONAA >60 10/06/2012 1348   GFRAA 88 07/06/2020 1514   GFRAA >60 10/06/2012 1348    No results found for: "SPEP", "UPEP"  Lab Results  Component Value Date   WBC 14.4 (H) 04/08/2022  NEUTROABS 11.0 (H) 04/08/2022   HGB 14.4 04/08/2022   HCT 40.5 04/08/2022   MCV 83.9 04/08/2022   PLT 302 04/08/2022      Chemistry      Component Value Date/Time   NA 136 11/07/2021 1113   NA 141 07/06/2020 1514   NA 138 10/06/2012 1348   K 4.1 11/07/2021 1113   K 4.1 10/06/2012 1348   CL 105 11/07/2021 1113   CL 106 10/06/2012 1348   CO2 22 11/07/2021 1113   CO2 28 10/06/2012 1348   BUN 11 11/07/2021 1113   BUN 13 07/06/2020 1514   BUN 11 10/06/2012 1348   CREATININE 0.77 11/07/2021 1113   CREATININE 0.75 10/06/2012 1348      Component Value Date/Time   CALCIUM 8.7 (L) 11/07/2021 1113   CALCIUM 8.8 10/06/2012 1348   ALKPHOS 151 (H) 09/07/2021 0042   AST 23 09/07/2021 0042   ALT 19 09/07/2021 0042   BILITOT 0.5  09/07/2021 0042

## 2022-04-08 NOTE — Assessment & Plan Note (Signed)
She has chronic leukocytosis intermittently Cause is unknown but could be related to body habitus She does not need long-term follow-up

## 2022-04-08 NOTE — Assessment & Plan Note (Signed)
From the anticoagulation standpoint, she has completed 6 months worth of anticoagulation therapy Repeat venous Doppler ultrasound on January 13 and March 8 showed no evidence of DVT Her anticoagulation therapy has been discontinued She does not need further work-up for long-term follow-up in this regard I will see her in the future if she needs perioperative anticoagulation management

## 2022-04-08 NOTE — Assessment & Plan Note (Signed)
Since discontinuation of Xarelto and intravenous iron infusion, her anemia has resolved She does not need long-term follow-up

## 2022-07-05 ENCOUNTER — Encounter: Payer: Self-pay | Admitting: Family Medicine

## 2022-07-05 ENCOUNTER — Telehealth: Payer: Self-pay | Admitting: Family Medicine

## 2022-07-05 ENCOUNTER — Telehealth (INDEPENDENT_AMBULATORY_CARE_PROVIDER_SITE_OTHER): Payer: 59 | Admitting: Family Medicine

## 2022-07-05 VITALS — Ht 66.0 in | Wt 245.8 lb

## 2022-07-05 DIAGNOSIS — H00012 Hordeolum externum right lower eyelid: Secondary | ICD-10-CM

## 2022-07-05 MED ORDER — POLYMYXIN B-TRIMETHOPRIM 10000-0.1 UNIT/ML-% OP SOLN: 2.0000 [drp] | OPHTHALMIC | 0 refills | Status: DC

## 2022-07-05 NOTE — Telephone Encounter (Signed)
Patient informed rx was sent 

## 2022-07-05 NOTE — Progress Notes (Addendum)
Patient ID: Robin Lopez, female   DOB: Mar 06, 1981, 41 y.o.   MRN: [id number]   Virtual Visit via Video Note  I connected with Cindra Eves on 07/05/22 at  8:30 AM EDT by a video enabled telemedicine application and verified that I am speaking with the correct person using two identifiers.  Location patient: home Location provider:work or home office Persons participating in the virtual visit: patient, provider  I discussed the limitations of evaluation and management by telemedicine and the availability of in person appointments. The patient expressed understanding and agreed to proceed.   HPI: Lashann had set up virtual with concern for possible "stye "versus conjunctivitis.  She has had some irritation for about a week now.  She has not goopy drainage from her right eye.  No significant eye pain.  She has noticed some localized swelling lower lid laterally.  Some generalized matting and drainage.  No significant eye pain.  No visual changes.  No left eye symptoms.   ROS: See pertinent positives and negatives per HPI.  Past Medical History:  Diagnosis Date   Anemia    Left leg DVT (HCC)    Menorrhagia     Past Surgical History:  Procedure Laterality Date   CESAREAN SECTION     x 2   CHOLECYSTECTOMY     TUBAL LIGATION     WISDOM TOOTH EXTRACTION      Family History  Problem Relation Age of Onset   Liver cancer Father    Hypertension Father    CAD Father    Deep vein thrombosis Brother    Stomach cancer Maternal Grandfather    Colon cancer Neg Hx     SOCIAL HX: Non-smoker  No current outpatient medications on file.  EXAM:  VITALS per patient if applicable:  GENERAL: alert, oriented, appears well and in no acute distress  HEENT: atraumatic, conjunttiva clear, no obvious abnormalities on inspection of external nose and ears  NECK: normal movements of the head and neck  LUNGS: on inspection no signs of respiratory distress, breathing rate appears normal,  no obvious gross SOB, gasping or wheezing  CV: no obvious cyanosis  MS: moves all visible extremities without noticeable abnormality  PSYCH/NEURO: pleasant and cooperative, no obvious depression or anxiety, speech and thought processing grossly intact  ASSESSMENT AND PLAN:  Discussed the following assessment and plan:  Probable stye/hordeolum right lower lid with possible bacterial conjunctivitis based on her history.  Continue warm compresses several times daily.  Start Polytrim ophthalmic drops 2 drops right eye every 4 hours while awake.  If not resolving by middle of next week recommend office follow-up to further evaluate     I discussed the assessment and treatment plan with the patient. The patient was provided an opportunity to ask questions and all were answered. The patient agreed with the plan and demonstrated an understanding of the instructions.   The patient was advised to call back or seek an in-person evaluation if the symptoms worsen or if the condition fails to improve as anticipated.     Carolann Littler, MD

## 2022-07-05 NOTE — Telephone Encounter (Signed)
Pt called to ask what is the name of the eye drops MD prescribed to her this a.m, and where did he send it?  Please call her back.  Ok to leave a detailed message.

## 2022-07-05 NOTE — Addendum Note (Signed)
Addended by: [name] on: 07/05/2022 01:52 PM   Modules accepted: Orders

## 2022-09-23 ENCOUNTER — Emergency Department: Payer: 59

## 2022-09-23 ENCOUNTER — Emergency Department
Admission: EM | Admit: 2022-09-23 | Discharge: 2022-09-23 | Disposition: A | Discharge status: Home / Self Care | Payer: 59 | Attending: Emergency Medicine | Admitting: Emergency Medicine

## 2022-09-23 DIAGNOSIS — R109 Unspecified abdominal pain: Secondary | ICD-10-CM | POA: Diagnosis present

## 2022-09-23 DIAGNOSIS — D259 Leiomyoma of uterus, unspecified: Secondary | ICD-10-CM | POA: Insufficient documentation

## 2022-09-23 DIAGNOSIS — R1033 Periumbilical pain: Secondary | ICD-10-CM

## 2022-09-23 LAB — URINALYSIS, ROUTINE W REFLEX MICROSCOPIC
Bacteria, UA: NONE SEEN
Bilirubin Urine: NEGATIVE
Glucose, UA: NEGATIVE mg/dL
Ketones, ur: NEGATIVE mg/dL
Leukocytes,Ua: NEGATIVE
Nitrite: NEGATIVE
Protein, ur: NEGATIVE mg/dL
Specific Gravity, Urine: 1.017 (ref 1.005–1.030)
pH: 5 (ref 5.0–8.0)

## 2022-09-23 LAB — COMPREHENSIVE METABOLIC PANEL
ALT: 12 U/L (ref 0–44)
AST: 15 U/L (ref 15–41)
Albumin: 3.8 g/dL (ref 3.5–5.0)
Alkaline Phosphatase: 112 U/L (ref 38–126)
Anion gap: 8 (ref 5–15)
BUN: 16 mg/dL (ref 6–20)
CO2: 21 mmol/L — ABNORMAL LOW (ref 22–32)
Calcium: 8.5 mg/dL — ABNORMAL LOW (ref 8.9–10.3)
Chloride: 106 mmol/L (ref 98–111)
Creatinine, Ser: 0.67 mg/dL (ref 0.44–1.00)
GFR, Estimated: >60 mL/min (ref >60)
Glucose, Bld: 121 mg/dL — ABNORMAL HIGH (ref 70–99)
Potassium: 3.9 mmol/L (ref 3.5–5.1)
Sodium: 135 mmol/L (ref 135–145)
Total Bilirubin: 0.4 mg/dL (ref 0.3–1.2)
Total Protein: 7 g/dL (ref 6.5–8.1)

## 2022-09-23 LAB — CBC
HCT: 40.1 % (ref 36.0–46.0)
Hemoglobin: 13.7 g/dL (ref 12.0–15.0)
MCH: 29.3 pg (ref 26.0–34.0)
MCHC: 34.2 g/dL (ref 30.0–36.0)
MCV: 85.9 fL (ref 80.0–100.0)
Platelets: 295 10*3/uL (ref 150–400)
RBC: 4.67 MIL/uL (ref 3.87–5.11)
RDW: 13.9 % (ref 11.5–15.5)
WBC: 15.3 10*3/uL — ABNORMAL HIGH (ref 4.0–10.5)
nRBC: 0 % (ref 0.0–0.2)

## 2022-09-23 LAB — LIPASE, BLOOD: Lipase: 55 U/L — ABNORMAL HIGH (ref 11–51)

## 2022-09-23 LAB — POC URINE PREG, ED: Preg Test, Ur: NEGATIVE

## 2022-09-23 MED ORDER — IOHEXOL 350 MG/ML SOLN
100.0000 mL | Freq: Once | INTRAVENOUS | Status: AC | PRN
  Administered 2022-09-23: 100 mL via INTRAVENOUS

## 2022-09-23 MED ORDER — ONDANSETRON HCL 4 MG/2ML IJ SOLN
4.0000 mg | Freq: Once | INTRAMUSCULAR | Status: AC
  Administered 2022-09-23: 4 mg via INTRAVENOUS
  Filled 2022-09-23: qty 2

## 2022-09-23 MED ORDER — LACTATED RINGERS IV BOLUS
1000.0000 mL | Freq: Once | INTRAVENOUS | Status: AC
  Administered 2022-09-23: 1000 mL via INTRAVENOUS

## 2022-09-23 MED ORDER — ONDANSETRON 4 MG PO TBDP: 4.0000 mg | ORAL_TABLET | Freq: Three times a day (TID) | ORAL | 0 refills | Status: DC | PRN

## 2022-09-23 NOTE — ED Provider Notes (Signed)
[facility] Provider Note    Event Date/Time   First MD Initiated Contact with Patient 09/23/22 2023     (approximate)   History   Chief Complaint Abdominal Pain   HPI  Robin Lopez is a 42 y.o. female with past medical history of DVT and uterine fibroids who presents to the ED complaining of abdominal pain.  Patient reports that she has been dealing with pain emanating from around her umbilicus since yesterday morning.  She describes the pain as constant and sharp, worse with particular movements.  She feels like her abdomen has been slightly distended and she does report recent constipation, but continues to have bowel movements.  She denies any nausea, vomiting, fevers, chills, dysuria, hematuria, or flank pain.  She has not taken anything for her symptoms prior to arrival.     Physical Exam   Triage Vital Signs: ED Triage Vitals  Enc Vitals Group     BP 09/23/22 1855 (!) 183/96     Pulse Rate 09/23/22 1855 88     Resp 09/23/22 1855 18     Temp 09/23/22 1855 98 F (36.7 C)     Temp src --      SpO2 09/23/22 1855 99 %     Weight --      Height --      Head Circumference --      Peak Flow --      Pain Score 09/23/22 1856 7     Pain Loc --      Pain Edu? --      Excl. in [place]? --     Most recent vital signs: Vitals:   09/23/22 1855 09/23/22 2149  BP: (!) 183/96 134/81  Pulse: 88 66  Resp: 18 20  Temp: 98 F (36.7 C)   SpO2: 99% 100%    Constitutional: Alert and oriented. Eyes: Conjunctivae are normal. Head: Atraumatic. Nose: No congestion/rhinnorhea. Mouth/Throat: Mucous membranes are moist.  Cardiovascular: Normal rate, regular rhythm. Grossly normal heart sounds.  2+ radial pulses bilaterally. Respiratory: Normal respiratory effort.  No retractions. Lungs CTAB. Gastrointestinal: Soft and diffusely tender to palpation with no rebound or guarding. No distention. Musculoskeletal: No lower extremity tenderness nor edema.   Neurologic:  Normal speech and language. No gross focal neurologic deficits are appreciated.    ED Results / Procedures / Treatments   Labs (all labs ordered are listed, but only abnormal results are displayed) Labs Reviewed  LIPASE, BLOOD - Abnormal; Notable for the following components:      Result Value   Lipase 55 (*)    All other components within normal limits  COMPREHENSIVE METABOLIC PANEL - Abnormal; Notable for the following components:   CO2 21 (*)    Glucose, Bld 121 (*)    Calcium 8.5 (*)    All other components within normal limits  CBC - Abnormal; Notable for the following components:   WBC 15.3 (*)    All other components within normal limits  URINALYSIS, ROUTINE W REFLEX MICROSCOPIC - Abnormal; Notable for the following components:   Color, Urine YELLOW (*)    APPearance HAZY (*)    Hgb urine dipstick SMALL (*)    All other components within normal limits  POC URINE PREG, ED   RADIOLOGY CT abdomen/pelvis reviewed and interpreted by me with large fibroid uterus but no inflammatory changes, dilated bowel loops, or focal fluid collections.  PROCEDURES:  Critical Care performed: No  Procedures   MEDICATIONS ORDERED  IN ED: Medications  ondansetron [facility]) injection 4 mg (4 mg Intravenous Given 09/23/22 2146)  lactated ringers bolus 1,000 mL (1,000 mLs Intravenous New Bag/Given 09/23/22 2146)  iohexol (OMNIPAQUE) 350 MG/ML injection 100 mL (100 mLs Intravenous Contrast Given 09/23/22 2159)     IMPRESSION / MDM / ASSESSMENT AND PLAN / ED COURSE  I reviewed the triage vital signs and the nursing notes.                              42 y.o. female with past medical history of DVT and fibroids who presents to the ED complaining of pain starting around her umbilicus since yesterday morning, gradually worsening since then.  Patient's presentation is most consistent with acute presentation with potential threat to life or bodily function.  Differential  diagnosis includes, but is not limited to, appendicitis, diverticulitis, UTI, kidney stone, umbilical hernia, ventral hernia.  Patient uncomfortable appearing but nontoxic, vital signs are unremarkable.  She has diffuse tenderness to palpation of her abdomen, greatest at the umbilicus but no evidence of obvious hernia on exam.  We will further assess with CT imaging, labs remarkable for leukocytosis with no significant anemia, electrolyte abnormality, or AKI.  Lipase mildly elevated but low suspicion for pancreatitis at this time, LFTs are unremarkable.  Pregnancy testing and urinalysis are pending, will treat with IV Zofran and IV fluids, patient declines pain medication currently.  CT imaging shows large fibroid uterus with no other acute findings, and uterine fibroids are likely the source of patient's pain.  Pregnancy testing is negative and urinalysis shows no signs of infection, patient is appropriate for discharge home with OB/GYN follow-up.  She was counseled to return to the ED for new or worsening symptoms, patient agrees with plan.      FINAL CLINICAL IMPRESSION(S) / ED DIAGNOSES   Final diagnoses:  Periumbilical abdominal pain  Uterine leiomyoma, unspecified location     Rx / DC Orders   ED Discharge Orders     None        Note:  This document was prepared using Dragon voice recognition software and may include unintentional dictation errors.   Blake Divine, MD 09/23/22 [phone]

## 2022-09-23 NOTE — ED Triage Notes (Addendum)
Pt to ED via POV from home. Pt reports ongoing/intermittent abdominal pain with N/V. Pt also reports constipation. Pt reports last year had a spontaneous ruptured and hx fibroids. Pt reports hx of blood clots and could not have a hysterectomy due to that. Pt with hx anemia and left leg DVT

## 2022-10-03 ENCOUNTER — Ambulatory Visit (INDEPENDENT_AMBULATORY_CARE_PROVIDER_SITE_OTHER): Payer: 59 | Admitting: Obstetrics and Gynecology

## 2022-10-03 ENCOUNTER — Encounter: Payer: Self-pay | Admitting: Obstetrics and Gynecology

## 2022-10-03 VITALS — BP 134/86 | HR 73 | Ht 66.0 in | Wt 245.3 lb

## 2022-10-03 DIAGNOSIS — R102 Pelvic and perineal pain: Secondary | ICD-10-CM

## 2022-10-03 DIAGNOSIS — N926 Irregular menstruation, unspecified: Secondary | ICD-10-CM

## 2022-10-03 DIAGNOSIS — D259 Leiomyoma of uterus, unspecified: Secondary | ICD-10-CM

## 2022-10-03 DIAGNOSIS — R1033 Periumbilical pain: Secondary | ICD-10-CM | POA: Diagnosis not present

## 2022-10-03 DIAGNOSIS — D219 Benign neoplasm of connective and other soft tissue, unspecified: Secondary | ICD-10-CM

## 2022-10-03 DIAGNOSIS — Z7689 Persons encountering health services in other specified circumstances: Secondary | ICD-10-CM

## 2022-10-03 NOTE — Progress Notes (Signed)
HPI:      [name] is a 42 y.o. [id number] who LMP was Patient's last menstrual period was 09/09/2022 (approximate).  Subjective:   She presents today because she has large uterine fibroids.  She has had issues in the past with very heavy bleeding and cramping.  She has received multiple iron infusions in the past.  She recently had a CT and the ultrasound which showed a very large uterus with fibroids.  Patient previously had surgery scheduled (hysterectomy) for fibroids but got COVID and a blood clot in her leg.  Her surgery was then put off.  She would like to reschedule hysterectomy.  She has been off blood thinners for 5 months. Patient has not had a vaginal birth-previous C-section    Hx: The following portions of the patient's history were reviewed and updated as appropriate:             She  has a past medical history of Anemia, Left leg DVT ([place]), and Menorrhagia. She does not have any pertinent problems on file. She  has a past surgical history that includes Cholecystectomy; Cesarean section; Tubal ligation; and Wisdom tooth extraction. Her family history includes CAD in her father; Deep vein thrombosis in her brother; Hypertension in her father; Liver cancer in her father; Stomach cancer in her maternal grandfather. She  reports that she has never smoked. She has never used smokeless tobacco. She reports that she does not drink alcohol and does not use drugs. She currently has no medications in their medication list. She is allergic to prednisone.       Review of Systems:  Review of Systems  Constitutional: Denied constitutional symptoms, night sweats, recent illness, fatigue, fever, insomnia and weight loss.  Eyes: Denied eye symptoms, eye pain, photophobia, vision change and visual disturbance.  Ears/Nose/Throat/Neck: Denied ear, nose, throat or neck symptoms, hearing loss, nasal discharge, sinus congestion and sore throat.  Cardiovascular: Denied cardiovascular symptoms,  arrhythmia, chest pain/pressure, edema, exercise intolerance, orthopnea and palpitations.  Respiratory: Denied pulmonary symptoms, asthma, pleuritic pain, productive sputum, cough, dyspnea and wheezing.  Gastrointestinal: Denied, gastro-esophageal reflux, melena, nausea and vomiting.  Genitourinary: See HPI for additional information.  Musculoskeletal: Denied musculoskeletal symptoms, stiffness, swelling, muscle weakness and myalgia.  Dermatologic: Denied dermatology symptoms, rash and scar.  Neurologic: Denied neurology symptoms, dizziness, headache, neck pain and syncope.  Psychiatric: Denied psychiatric symptoms, anxiety and depression.  Endocrine: Denied endocrine symptoms including hot flashes and night sweats.   Meds:   No current outpatient medications on file prior to visit.   No current facility-administered medications on file prior to visit.      Objective:     Vitals:   10/03/22 1100 10/03/22 1107  BP: (!) 140/84 134/86  Pulse: 80 73   Filed Weights   10/03/22 1100  Weight: 245 lb 4.8 oz (111.3 kg)              Abdominal examination reveals 18 weeks size uterus.          Assessment:    G2P2002 Patient Active Problem List   Diagnosis Date Noted   Leukocytosis 04/08/2022   Iron deficiency anemia 11/20/2021   Blood loss anemia 06/21/2021   History of DVT (deep vein thrombosis) 05/16/2021   Fibroids 05/16/2021     1. Establishing care with new doctor, encounter for   2. Fibroids   3. Umbilical pain   4. Pelvic pain   5. Irregular menstrual cycle     Large uterine  fibroids, history of menorrhagia but not current.  Patient desires definitive management with hysterectomy.   Plan:            1.  We have discussed use of Mirena, uterine fibroid embolization, and hysterectomy.  Patient has affirmed her desire for hysterectomy.  Would recommend abdominal hysterectomy because of the large uterine size and history of cesarean delivery.  We have discussed  this in detail and all of her questions were answered. Patient will schedule this when she has made up her mind. Orders No orders of the defined types were placed in this encounter.   No orders of the defined types were placed in this encounter.     F/U  No follow-ups on file. I spent 31 minutes involved in the care of this patient preparing to see the patient by obtaining and reviewing her medical history (including labs, imaging tests and prior procedures), documenting clinical information in the electronic health record (EHR), counseling and coordinating care plans, writing and sending prescriptions, ordering tests or procedures and in direct communicating with the patient and medical staff discussing pertinent items from her history and physical exam.  Finis Bud, M.D. 10/03/2022 11:32 AM

## 2022-10-03 NOTE — Progress Notes (Signed)
Patient presents today after recent ED visit. She states a history of fibroids and pain relating to them. Patient has extended menstrual cycles that are typically very heavy with moderate cramping. She also reports constantly being uncomfortable along with increased back abdominal and pelvic pain along with pain and bleeding during intercourse.

## 2022-10-11 ENCOUNTER — Inpatient Hospital Stay: Payer: 59 | Admitting: Family Medicine

## 2022-10-15 ENCOUNTER — Encounter: Payer: Self-pay | Admitting: Family Medicine

## 2022-10-15 ENCOUNTER — Ambulatory Visit (INDEPENDENT_AMBULATORY_CARE_PROVIDER_SITE_OTHER): Payer: 59 | Admitting: Family Medicine

## 2022-10-15 VITALS — BP 138/84 | HR 80 | Temp 98.1°F | Ht 66.0 in | Wt 249.0 lb

## 2022-10-15 DIAGNOSIS — R739 Hyperglycemia, unspecified: Secondary | ICD-10-CM | POA: Diagnosis not present

## 2022-10-15 DIAGNOSIS — N281 Cyst of kidney, acquired: Secondary | ICD-10-CM | POA: Diagnosis not present

## 2022-10-15 DIAGNOSIS — K429 Umbilical hernia without obstruction or gangrene: Secondary | ICD-10-CM

## 2022-10-15 LAB — POCT GLYCOSYLATED HEMOGLOBIN (HGB A1C): Hemoglobin A1C: 5.8 % — AB (ref 4.0–5.6)

## 2022-10-15 NOTE — Progress Notes (Signed)
Established Patient Office Visit  Subjective   Patient ID: Robin Lopez, female    DOB: 10-Apr-1981  Age: 43 y.o. MRN: [id number]  Chief Complaint  Patient presents with   Hospitalization Follow-up    HPI   Robin Lopez seen for ER follow-up.  She has history of uterine fibroids and also past history of DVT which she experienced with COVID few years ago.  She noticed some pain and swelling right paraumbilical region.  She described the pain is relatively constant and sharp.  She pressed slightly on this area and afterwards noticed some increased vaginal bleeding.  She is waiting to get hysterectomy for severe uterine fibroids.  She is no longer taking anticoagulant.  Recent ER note reviewed.  Had multiple labs including urine pregnancy test, urinalysis, CBC, CMP, lipase.  Lipase mildly elevated 55.  CBC stable.  She has some chronic mild leukocytosis which has been discussed with hematology in the past.  Hemoglobin stable.  CT scan showed no acute process.  Mild hepatosplenomegaly.  Marked enlargement of the uterus compatible with fibroids.  Comment of small fat-containing umbilical and periumbilical hernias.  She also had, meant of benign renal cyst 1.6 cm on the left kidney.  Her blood pressure was up initially there but came down some after rest.  Likewise, up today but did improve some after rest.  She does have family history of type 2 diabetes.  She did have slightly elevated blood sugar with recent labs in the ER but this was nonfasting.  No recent polyuria or polydipsia.  Past Medical History:  Diagnosis Date   Anemia    Left leg DVT ([place])    Menorrhagia    Past Surgical History:  Procedure Laterality Date   CESAREAN SECTION     x 2   CHOLECYSTECTOMY     TUBAL LIGATION     WISDOM TOOTH EXTRACTION      reports that she has never smoked. She has never used smokeless tobacco. She reports that she does not drink alcohol and does not use drugs. family history includes CAD in her  father; Deep vein thrombosis in her brother; Hypertension in her father; Liver cancer in her father; Stomach cancer in her maternal grandfather. Allergies  Allergen Reactions   Prednisone Itching and Rash    Review of Systems  Constitutional:  Negative for chills, fever and malaise/fatigue.  Eyes:  Negative for blurred vision.  Respiratory:  Negative for shortness of breath.   Cardiovascular:  Negative for chest pain.  Gastrointestinal:  Positive for abdominal pain. Negative for nausea and vomiting.  Genitourinary:  Negative for dysuria.  Neurological:  Negative for dizziness, weakness and headaches.      Objective:     BP 138/84 (BP Location: Left Arm, Cuff Size: Normal)   Pulse 80   Temp 98.1 F (36.7 C) (Oral)   Ht 5' 6"$  (1.676 m)   Wt 249 lb (112.9 kg)   LMP 09/09/2022 (Approximate)   SpO2 99%   BMI 40.19 kg/m  BP Readings from Last 3 Encounters:  10/15/22 138/84  10/03/22 134/86  09/23/22 136/71   Wt Readings from Last 3 Encounters:  10/15/22 249 lb (112.9 kg)  10/03/22 245 lb 4.8 oz (111.3 kg)  07/05/22 245 lb 12.8 oz (111.5 kg)      Physical Exam Vitals reviewed.  Constitutional:      Appearance: She is well-developed.  Eyes:     Pupils: Pupils are equal, round, and reactive to light.  Neck:  Thyroid: No thyromegaly.     Vascular: No JVD.  Cardiovascular:     Rate and Rhythm: Normal rate and regular rhythm.     Heart sounds:     No gallop.  Pulmonary:     Effort: Pulmonary effort is normal. No respiratory distress.     Breath sounds: Normal breath sounds. No wheezing or rales.  Abdominal:     Comments: She has small palpated right periumbilical hernia.  Nontender.  Musculoskeletal:     Cervical back: Neck supple.  Neurological:     Mental Status: She is alert.      Results for orders placed or performed in visit on 10/15/22  POC HgB A1c  Result Value Ref Range   Hemoglobin A1C 5.8 (A) 4.0 - 5.6 %   HbA1c POC (<> result, manual entry)      HbA1c, POC (prediabetic range)     HbA1c, POC (controlled diabetic range)      Last CBC Lab Results  Component Value Date   WBC 15.3 (H) 09/23/2022   HGB 13.7 09/23/2022   HCT 40.1 09/23/2022   MCV 85.9 09/23/2022   MCH 29.3 09/23/2022   RDW 13.9 09/23/2022   PLT 295 [id number]   Last metabolic panel Lab Results  Component Value Date   GLUCOSE 121 (H) 09/23/2022   NA 135 09/23/2022   K 3.9 09/23/2022   CL 106 09/23/2022   CO2 21 (L) 09/23/2022   BUN 16 09/23/2022   CREATININE 0.67 09/23/2022   GFRNONAA >60 09/23/2022   CALCIUM 8.5 (L) 09/23/2022   PROT 7.0 09/23/2022   ALBUMIN 3.8 09/23/2022   BILITOT 0.4 09/23/2022   ALKPHOS 112 09/23/2022   AST 15 09/23/2022   ALT 12 09/23/2022   ANIONGAP 8 09/23/2022   Last hemoglobin A1c Lab Results  Component Value Date   HGBA1C 5.8 (A) 10/15/2022   Last thyroid functions Lab Results  Component Value Date   TSH 2.898 06/05/2018      The ASCVD Risk score (Arnett DK, et al., 2019) failed to calculate for the following reasons:   Cannot find a previous HDL lab   Cannot find a previous total cholesterol lab    Assessment & Plan:   #1 intermittent periumbilical pain.  She has evidence for right paraumbilical hernia and also apparently small umbilical hernia by recent CT.  She is looking at upcoming abdominal hysterectomy soon and would like to explore whether hernia could be repaired possibly concomitantly.  Will set up referral to general surgeon to discuss.  She does not have any evidence for acute strangulation at this time.  Reviewed signs and symptoms  #2 elevated blood pressure without diagnosis of hypertension.  Did come down some with rest.  We discussed nonpharmacologic management with weight loss, sodium reduction, exercise.  We recommend close monitoring.  She has home cuff and bring in to compare with ours at some point soon.  Be in touch if consistent readings over [id number] systolic or 90 diastolic  #3 recent mild  hyperglycemia on nonfasting lab.  A1c today 5.8%.  Work on weight loss and low glycemic diet.  Consider repeat A1c within the next year  #4 benign appearing left kidney cyst.  No atypical features.  Reassurance.  #5 uterine fibroids with history of heavy vaginal bleeding.  Recent hemoglobin stable.  Patient waiting on total abdominal hysterectomy soon   No follow-ups on file.    Carolann Littler, MD

## 2022-10-15 NOTE — Patient Instructions (Signed)
Monitor blood pressure at home and be in touch if consistently > 140/90.    I will set up general surgery appointment regarding the umbilical hernia.

## 2022-12-04 ENCOUNTER — Encounter: Payer: Self-pay | Admitting: Family Medicine

## 2023-01-12 IMAGING — CT CT ANGIO CHEST
2 of 7 series · 18 of 46 positions shown · IV contrast (APPLIED)
Comparison: Chest CTA 05/24/2021

CLINICAL DATA: Pulmonary embolism (PE) suspected, high prob

Chest pain.  History of DVT.
EXAM:
CT ANGIOGRAPHY CHEST WITH CONTRAST
TECHNIQUE: Multidetector CT imaging of the chest was performed using the
standard protocol during bolus administration of intravenous
contrast. Multiplanar CT image reconstructions and MIPs were
obtained to evaluate the vascular anatomy.

[Series 5: thins · axial · 0.98mm/px · z∈[+1045,+1366]mm · 15 of 516 slices shown]
[im 29/516  lung]
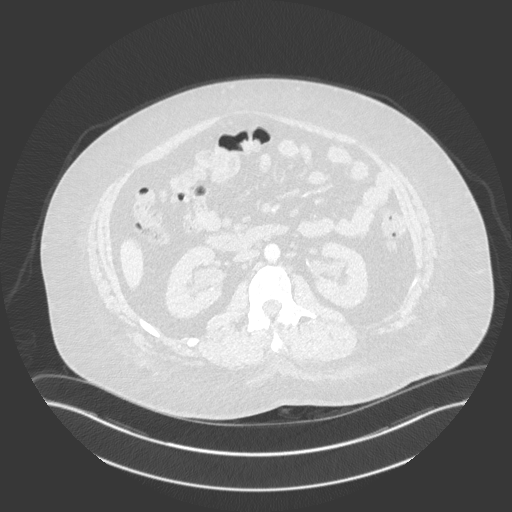
[im 58/516  soft-tissue]
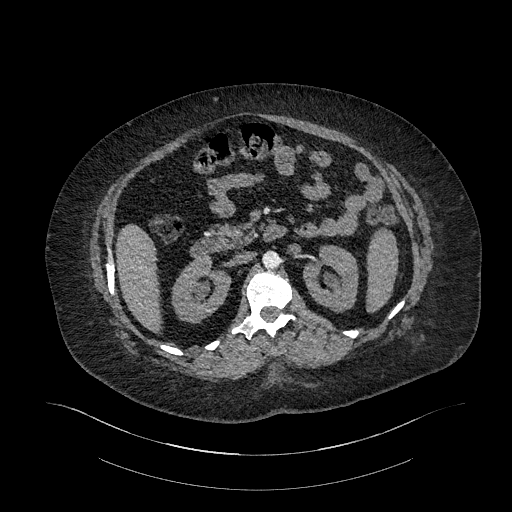
[im 86/516  lung]
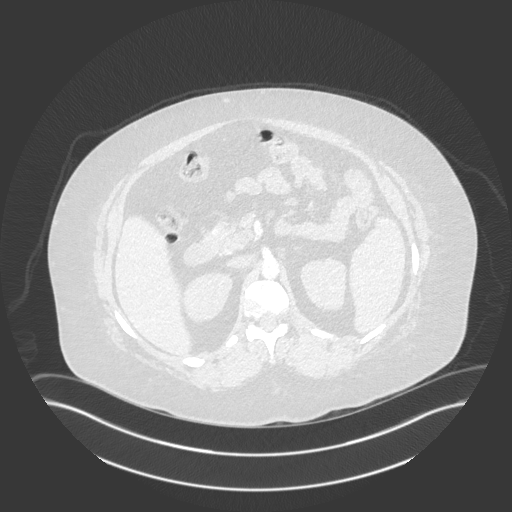
[im 115/516  soft-tissue]
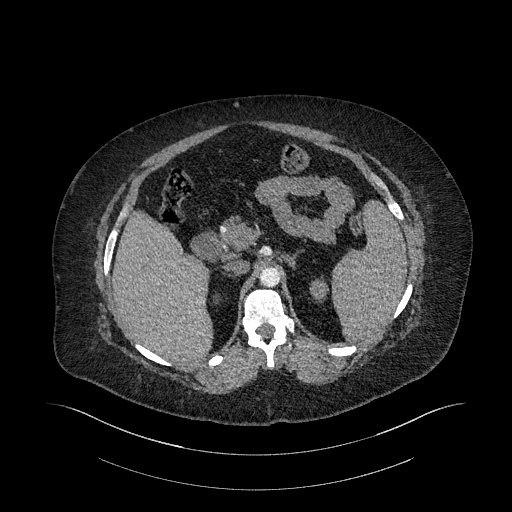
[im 172/516  lung]
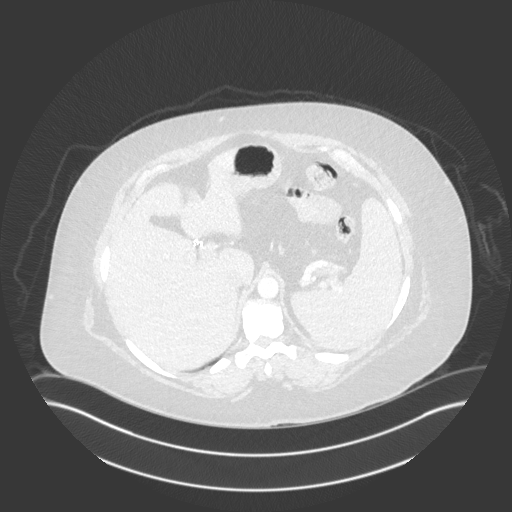
[im 201/516  soft-tissue]
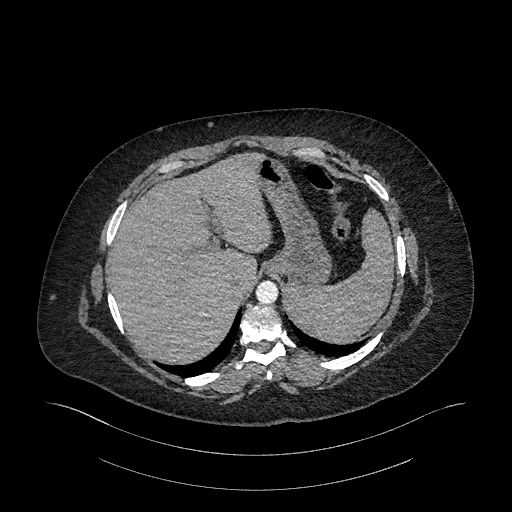
[im 229/516  lung]
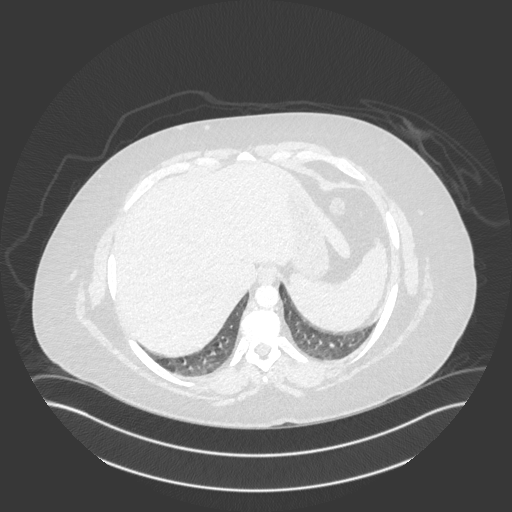
[im 258/516  soft-tissue]
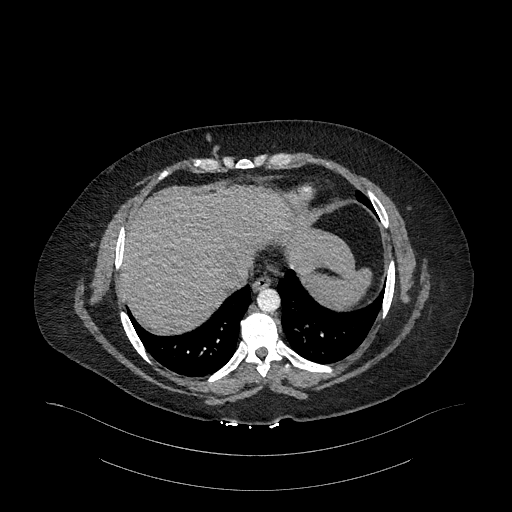
[im 287/516  lung]
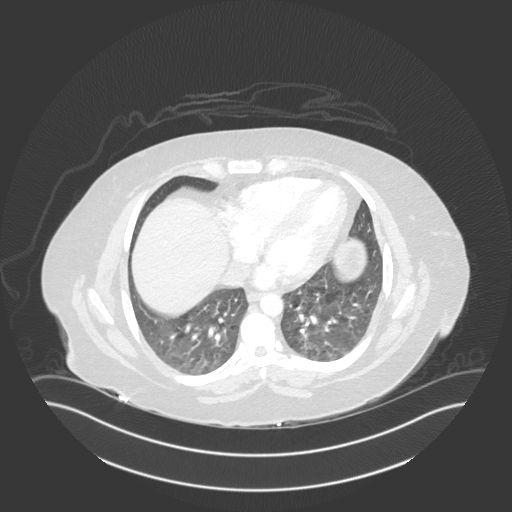
[im 315/516  soft-tissue]
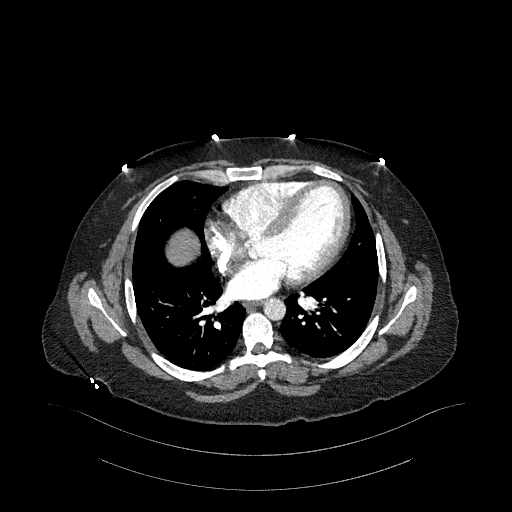
[im 344/516  lung]
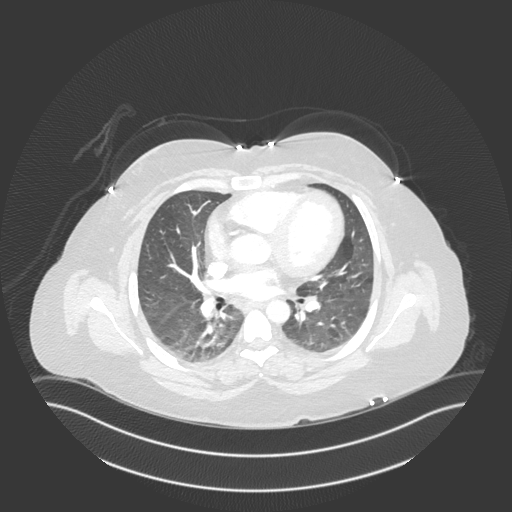
[im 401/516  soft-tissue]
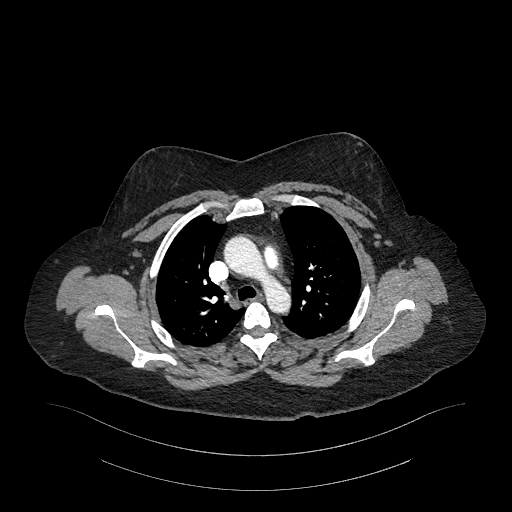
[im 430/516  lung]
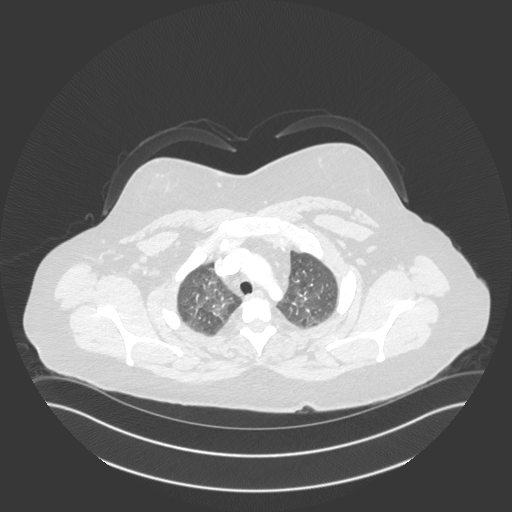
[im 458/516  soft-tissue]
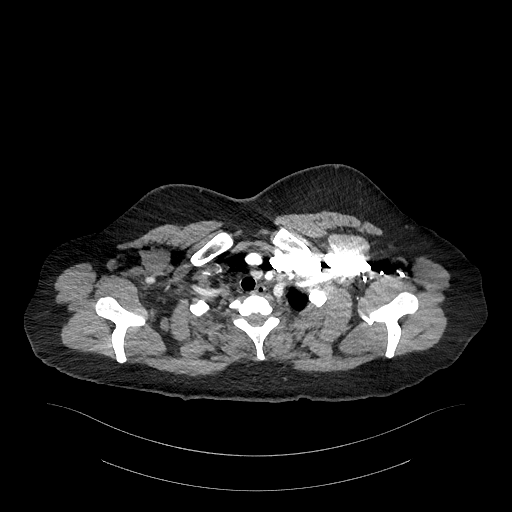
[im 487/516  lung]
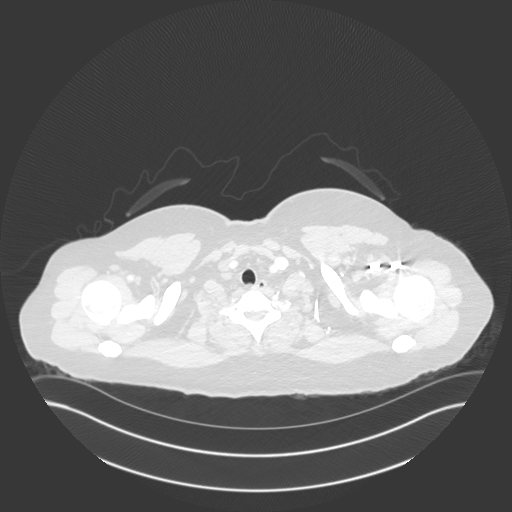

[Series 6: cor · coronal · 0.70mm/px · 3 of 171 slices shown]
[im 43/171  soft-tissue]
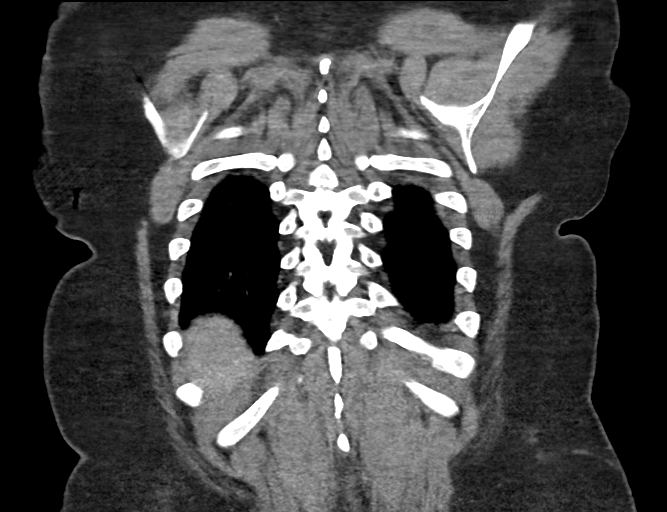
[im 86/171  soft-tissue]
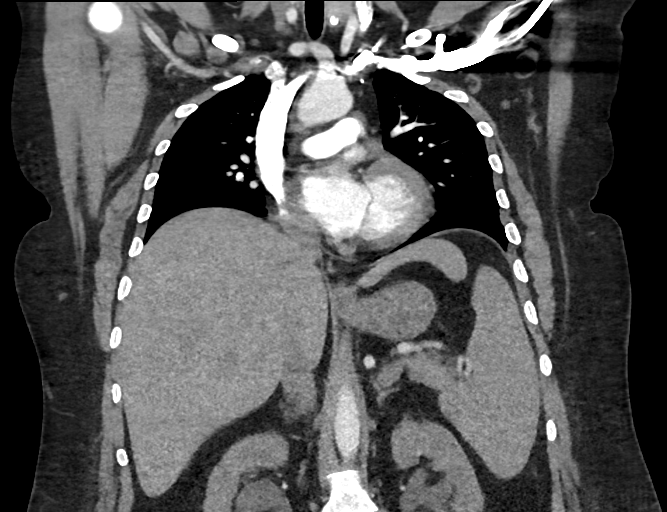
[im 128/171  soft-tissue]
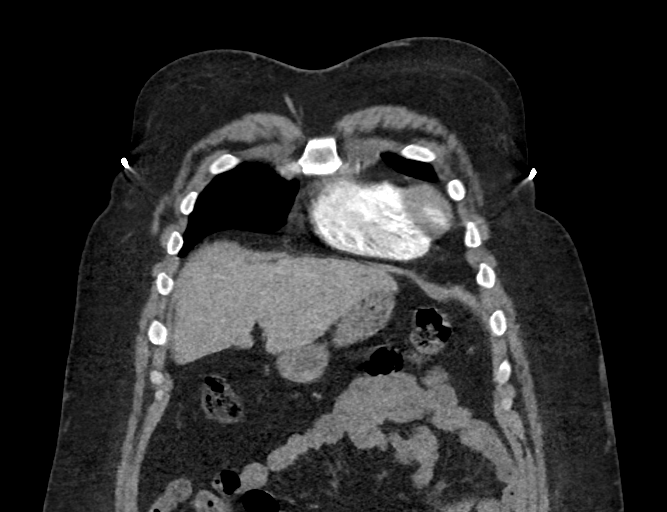

[18 of 46 positions shown; findings below may reference images not displayed]

RADIATION DOSE REDUCTION: This exam was performed according to the
departmental dose-optimization program which includes automated
exposure control, adjustment of the mA and/or kV according to
patient size and/or use of iterative reconstruction technique.

CONTRAST:  100mL OMNIPAQUE IOHEXOL 350 MG/ML SOLN
FINDINGS: Cardiovascular: There are no filling defects within the pulmonary
arteries to suggest pulmonary embolus. The thoracic aorta is normal
in caliber. No evidence of aortic dissection or acute aortic
findings. Conventional branching pattern from the aortic arch. The
heart is normal in size. There is no pericardial effusion.

Mediastinum/Nodes: No enlarged mediastinal or hilar lymph nodes. The
esophagus is decompressed. Heterogeneous left lobe of the thyroid
gland, no discrete nodule is demonstrated on the current exam.

Lungs/Pleura: Heterogeneous pulmonary parenchyma. No focal or
confluent airspace disease. No pleural effusion. Mild central
bronchial thickening. No pulmonary nodule or mass.

Upper Abdomen: Symmetric bilateral mild hydronephrosis.
Cholecystectomy. Hepatosplenomegaly which was seen on prior
abdominopelvic CT 09/07/2021.

Musculoskeletal: There are no acute or suspicious osseous
abnormalities. Occasional Schmorl's nodes in the thoracic spine. No
chest wall soft tissue abnormalities.

Review of the MIP images confirms the above findings.
IMPRESSION: 1. No pulmonary embolus.
2. Heterogeneous pulmonary parenchyma with mild central bronchial
thickening, can be seen with small airways disease.
3. Symmetric bilateral mild hydronephrosis in the upper abdomen, of
unknown etiology. Recommend correlation for any urinary symptoms.

## 2023-01-14 ENCOUNTER — Ambulatory Visit: Payer: 59 | Admitting: Obstetrics and Gynecology

## 2023-02-04 ENCOUNTER — Ambulatory Visit (INDEPENDENT_AMBULATORY_CARE_PROVIDER_SITE_OTHER): Payer: 59 | Admitting: Obstetrics and Gynecology

## 2023-02-04 ENCOUNTER — Encounter: Payer: Self-pay | Admitting: Obstetrics and Gynecology

## 2023-02-04 VITALS — BP 122/73 | HR 81 | Ht 66.0 in | Wt 243.7 lb

## 2023-02-04 DIAGNOSIS — Z01818 Encounter for other preprocedural examination: Secondary | ICD-10-CM

## 2023-02-04 DIAGNOSIS — D259 Leiomyoma of uterus, unspecified: Secondary | ICD-10-CM | POA: Diagnosis not present

## 2023-02-04 DIAGNOSIS — D219 Benign neoplasm of connective and other soft tissue, unspecified: Secondary | ICD-10-CM

## 2023-02-04 NOTE — Progress Notes (Signed)
Patient presents today for a pre-op exam prior to hysterectomy. She states no additional concerns today.

## 2023-02-04 NOTE — Progress Notes (Signed)
PRE-OPERATIVE HISTORY AND PHYSICAL EXAM  PCP:  Kristian Covey, MD Subjective:   HPI:  Robin Lopez is a 42 y.o. [id number].  Patient's last menstrual period was 01/23/2023.  She presents today for a pre-op discussion and PE.  She has the following symptoms: Large symptomatic uterine fibroids often resulting in very heavy menses and subsequent anemia. Of significant note patient has had a prior cesarean delivery She is trying to schedule umbilical hernia repair surgery concurrently. Patient has a prior history of DVT -has been off anticoagulation for greater than 6 months.  Review of Systems:   Constitutional: Denied constitutional symptoms, night sweats, recent illness, fatigue, fever, insomnia and weight loss.  Eyes: Denied eye symptoms, eye pain, photophobia, vision change and visual disturbance.  Ears/Nose/Throat/Neck: Denied ear, nose, throat or neck symptoms, hearing loss, nasal discharge, sinus congestion and sore throat.  Cardiovascular: Denied cardiovascular symptoms, arrhythmia, chest pain/pressure, edema, exercise intolerance, orthopnea and palpitations.  Respiratory: Denied pulmonary symptoms, asthma, pleuritic pain, productive sputum, cough, dyspnea and wheezing.  Gastrointestinal: Denied, gastro-esophageal reflux, melena, nausea and vomiting.  Genitourinary: See HPI for additional information.  Musculoskeletal: Denied musculoskeletal symptoms, stiffness, swelling, muscle weakness and myalgia.  Dermatologic: Denied dermatology symptoms, rash and scar.  Neurologic: Denied neurology symptoms, dizziness, headache, neck pain and syncope.  Psychiatric: Denied psychiatric symptoms, anxiety and depression.  Endocrine: Denied endocrine symptoms including hot flashes and night sweats.   OB History  Gravida Para Term Preterm AB Living  2 2 2     2   SAB IAB Ectopic Multiple Live Births          2    # Outcome Date GA Lbr Len/2nd Weight Sex Delivery Anes PTL Lv  2  Term 2003     CS-Unspec   LIV  1 Term 2001     CS-Unspec   LIV    Past Medical History:  Diagnosis Date   Anemia    Left leg DVT (HCC)    Menorrhagia     Past Surgical History:  Procedure Laterality Date   CESAREAN SECTION     x 2   CHOLECYSTECTOMY     TUBAL LIGATION     WISDOM TOOTH EXTRACTION        SOCIAL HISTORY:  Social History   Tobacco Use  Smoking Status Never  Smokeless Tobacco Never   Social History   Substance and Sexual Activity  Alcohol Use No    Social History   Substance and Sexual Activity  Drug Use No    Family History  Problem Relation Age of Onset   Liver cancer Father    Hypertension Father    CAD Father    Deep vein thrombosis Brother    Stomach cancer Maternal Grandfather    Colon cancer Neg Hx     ALLERGIES:  Prednisone  MEDS:   No current outpatient medications on file prior to visit.   No current facility-administered medications on file prior to visit.    No orders of the defined types were placed in this encounter.    Physical examination BP 122/73   Pulse 81   Ht 5\' 6"  (1.676 m)   Wt 243 lb 11.2 oz (110.5 kg)   LMP 01/23/2023   BMI 39.33 kg/m   General NAD, Conversant  HEENT Atraumatic; Op clear with mmm.  Normo-cephalic. Pupils reactive. Anicteric sclerae  Thyroid/Neck Smooth without nodularity or enlargement. Normal ROM.  Neck Supple.  Skin No rashes,  lesions or ulceration. Normal palpated skin turgor. No nodularity.  Breasts: No masses or discharge.  Symmetric.  No axillary adenopathy.  Lungs: Clear to auscultation.No rales or wheezes. Normal Respiratory effort, no retractions.  Heart: NSR.  No murmurs or rubs appreciated. No peripheral edema  Abdomen: Soft.  Non-tender.  Large uterine fibroids/uterine mass above the umbilicus no HSM.  Extremities: Moves all appropriately.  Normal ROM for age. No lymphadenopathy.  Neuro: Oriented to PPT.  Normal mood. Normal affect.     Pelvic:   Vulva: Normal  appearance.  No lesions.  Vagina: No lesions or abnormalities noted.  Support: Normal pelvic support.  Urethra No masses tenderness or scarring.  Meatus Normal size without lesions or prolapse.  Cervix: Normal ectropion.  No lesions.  Anus: Normal exam.  No lesions.  Perineum: Normal exam.  No lesions.        Bimanual   Uterus: Consistent with large uterine fibroids (see ultrasound)  Adnexae: No masses.  Non-tender to palpation.  Cul-de-sac: Negative for abnormality.   Assessment:   G2P2002 Patient Active Problem List   Diagnosis Date Noted   Leukocytosis 04/08/2022   Iron deficiency anemia 11/20/2021   Blood loss anemia 06/21/2021   History of DVT (deep vein thrombosis) 05/16/2021   Fibroids 05/16/2021    1. Pre-op exam   2. Fibroids     Fibroids are too large to deliver vaginally.  Abdominal surgery necessary.  Possible vertical incision based on the size of the fibroids, the patient's anatomy and especially in light of umbilical hernia surgery.  Plan:   Orders: No orders of the defined types were placed in this encounter.    1.  TAH bilateral salpingectomy possible concurrent umbilical hernia surgery  Pre-op discussions regarding Risks and Benefits of her scheduled surgery.  TAH The procedure of Total Abdominal Hysterectomy was described to the patient in detail.  We reviewed the rationale for Hysterectomy and the patient was again informed of other non-surgical management possibilities for her condition.  She has considered these other options, and desires a Hysterectomy.  We have reviewed the fact that Hysterectomy is permanent and that following the procedure she will not be able to become pregnant or bear children.  We have discussed the following risk factors specifically, and the patient has also been informed that additional complications not mentioned may develop;  damage to bowel, bladder, ureters, or to other internal organs, bleeding, infection and the risk from  anesthesia.  We have discussed the procedure itself in detail and she has an informed understanding of this surgery.  We have also discussed the recovery period in which physical and sexual activity will be restricted for a varying degree of time, often 3 - 6 weeks.  I have answered all of her questions and I believe she has an informed understanding of Abdominal Hysterectomy.  I spent 38 minutes involved in the care of this patient preparing to see the patient by obtaining and reviewing her medical history (including labs, imaging tests and prior procedures), documenting clinical information in the electronic health record (EHR), counseling and coordinating care plans, writing and sending prescriptions, ordering tests or procedures and in direct communicating with the patient and medical staff discussing pertinent items from her history and physical exam.  Elonda Husky, M.D. 02/04/2023 12:30 PM

## 2023-02-04 NOTE — H&P (Signed)
PRE-OPERATIVE HISTORY AND PHYSICAL EXAM  PCP:  Kristian Covey, MD Subjective:   HPI:  Robin Lopez is a 42 y.o. [id number].  Patient's last menstrual period was 01/23/2023.  She presents today for a pre-op discussion and PE.  She has the following symptoms: Large symptomatic uterine fibroids often resulting in very heavy menses and subsequent anemia. Of significant note patient has had a prior cesarean delivery She is trying to schedule umbilical hernia repair surgery concurrently. Patient has a prior history of DVT -has been off anticoagulation for greater than 6 months.  Review of Systems:   Constitutional: Denied constitutional symptoms, night sweats, recent illness, fatigue, fever, insomnia and weight loss.  Eyes: Denied eye symptoms, eye pain, photophobia, vision change and visual disturbance.  Ears/Nose/Throat/Neck: Denied ear, nose, throat or neck symptoms, hearing loss, nasal discharge, sinus congestion and sore throat.  Cardiovascular: Denied cardiovascular symptoms, arrhythmia, chest pain/pressure, edema, exercise intolerance, orthopnea and palpitations.  Respiratory: Denied pulmonary symptoms, asthma, pleuritic pain, productive sputum, cough, dyspnea and wheezing.  Gastrointestinal: Denied, gastro-esophageal reflux, melena, nausea and vomiting.  Genitourinary: See HPI for additional information.  Musculoskeletal: Denied musculoskeletal symptoms, stiffness, swelling, muscle weakness and myalgia.  Dermatologic: Denied dermatology symptoms, rash and scar.  Neurologic: Denied neurology symptoms, dizziness, headache, neck pain and syncope.  Psychiatric: Denied psychiatric symptoms, anxiety and depression.  Endocrine: Denied endocrine symptoms including hot flashes and night sweats.   OB History  Gravida Para Term Preterm AB Living  2 2 2     2   SAB IAB Ectopic Multiple Live Births          2    # Outcome Date GA Lbr Len/2nd Weight Sex Delivery Anes PTL Lv  2  Term 2003     CS-Unspec   LIV  1 Term 2001     CS-Unspec   LIV    Past Medical History:  Diagnosis Date   Anemia    Left leg DVT (HCC)    Menorrhagia     Past Surgical History:  Procedure Laterality Date   CESAREAN SECTION     x 2   CHOLECYSTECTOMY     TUBAL LIGATION     WISDOM TOOTH EXTRACTION        SOCIAL HISTORY:  Social History   Tobacco Use  Smoking Status Never  Smokeless Tobacco Never   Social History   Substance and Sexual Activity  Alcohol Use No    Social History   Substance and Sexual Activity  Drug Use No    Family History  Problem Relation Age of Onset   Liver cancer Father    Hypertension Father    CAD Father    Deep vein thrombosis Brother    Stomach cancer Maternal Grandfather    Colon cancer Neg Hx     ALLERGIES:  Prednisone  MEDS:   No current outpatient medications on file prior to visit.   No current facility-administered medications on file prior to visit.    No orders of the defined types were placed in this encounter.    Physical examination BP 122/73   Pulse 81   Ht 5\' 6"  (1.676 m)   Wt 243 lb 11.2 oz (110.5 kg)   LMP 01/23/2023   BMI 39.33 kg/m   General NAD, Conversant  HEENT Atraumatic; Op clear with mmm.  Normo-cephalic. Pupils reactive. Anicteric sclerae  Thyroid/Neck Smooth without nodularity or enlargement. Normal ROM.  Neck Supple.  Skin No rashes,  lesions or ulceration. Normal palpated skin turgor. No nodularity.  Breasts: No masses or discharge.  Symmetric.  No axillary adenopathy.  Lungs: Clear to auscultation.No rales or wheezes. Normal Respiratory effort, no retractions.  Heart: NSR.  No murmurs or rubs appreciated. No peripheral edema  Abdomen: Soft.  Non-tender.  Large uterine fibroids/uterine mass above the umbilicus no HSM.  Extremities: Moves all appropriately.  Normal ROM for age. No lymphadenopathy.  Neuro: Oriented to PPT.  Normal mood. Normal affect.     Pelvic:   Vulva: Normal  appearance.  No lesions.  Vagina: No lesions or abnormalities noted.  Support: Normal pelvic support.  Urethra No masses tenderness or scarring.  Meatus Normal size without lesions or prolapse.  Cervix: Normal ectropion.  No lesions.  Anus: Normal exam.  No lesions.  Perineum: Normal exam.  No lesions.        Bimanual   Uterus: Consistent with large uterine fibroids (see ultrasound)  Adnexae: No masses.  Non-tender to palpation.  Cul-de-sac: Negative for abnormality.   Assessment:   G2P2002 Patient Active Problem List   Diagnosis Date Noted   Leukocytosis 04/08/2022   Iron deficiency anemia 11/20/2021   Blood loss anemia 06/21/2021   History of DVT (deep vein thrombosis) 05/16/2021   Fibroids 05/16/2021    1. Pre-op exam   2. Fibroids     Fibroids are too large to deliver vaginally.  Abdominal surgery necessary.  Possible vertical incision based on the size of the fibroids, the patient's anatomy and especially in light of umbilical hernia surgery.  Plan:   Orders: No orders of the defined types were placed in this encounter.    1.  TAH bilateral salpingectomy possible concurrent umbilical hernia surgery  Pre-op discussions regarding Risks and Benefits of her scheduled surgery.  TAH The procedure of Total Abdominal Hysterectomy was described to the patient in detail.  We reviewed the rationale for Hysterectomy and the patient was again informed of other non-surgical management possibilities for her condition.  She has considered these other options, and desires a Hysterectomy.  We have reviewed the fact that Hysterectomy is permanent and that following the procedure she will not be able to become pregnant or bear children.  We have discussed the following risk factors specifically, and the patient has also been informed that additional complications not mentioned may develop;  damage to bowel, bladder, ureters, or to other internal organs, bleeding, infection and the risk from  anesthesia.  We have discussed the procedure itself in detail and she has an informed understanding of this surgery.  We have also discussed the recovery period in which physical and sexual activity will be restricted for a varying degree of time, often 3 - 6 weeks.  I have answered all of her questions and I believe she has an informed understanding of Abdominal Hysterectomy.

## 2023-03-04 ENCOUNTER — Ambulatory Visit: Payer: Self-pay | Admitting: Surgery

## 2023-03-04 NOTE — H&P (View-Only) (Signed)
 Subjective:   CC: Recurrent incisional hernia with incarceration [K43.0]  HPI:  Robin Lopez is a 42 y.o. female who was referred by Jarrett Soho, MD for evaluation of above. Symptoms were first noted after lap chole.  Noticeable lump with discomfort.  Scheduled for open hysterctomy in a couple weeks, requesting incisional hernia at umbilicus repaired at same time.   Past Medical History:  has a past medical history of Anemia, Chicken pox, DVT (deep venous thrombosis) (CMS/HHS-HCC), and GERD (gastroesophageal reflux disease).  Past Surgical History:  Past Surgical History:  Procedure Laterality Date   CESAREAN SECTION     CHOLECYSTECTOMY     TUBAL LIGATION     WISDOM TEETH      Family History: family history includes Heart disease in her father; High blood pressure (Hypertension) in her father and mother; Rheum arthritis in her brother.  Social History:  reports that she has never smoked. She has never used smokeless tobacco. She reports that she does not drink alcohol and does not use drugs.  Current Medications: has a current medication list which includes the following prescription(s): albuterol mdi (proventil, ventolin, proair) hfa and ibuprofen.  Allergies:  Allergies as of 03/04/2023 - Reviewed 03/04/2023  Allergen Reaction Noted   Prednisone Itching and Rash 02/26/2013    ROS:  A 15 point review of systems was performed and pertinent positives and negatives noted in HPI   Objective:     BP (!) 146/85   Pulse 74   Ht 167.6 cm (5' 5.98")   Wt (!) 111.6 kg (246 lb 0.5 oz)   LMP 03/02/2023   BMI 39.73 kg/m   Constitutional :  Alert, cooperative, no distress  Lymphatics/Throat:  Supple, no lymphadenopathy  Respiratory:  clear to auscultation bilaterally  Cardiovascular:  regular rate and rhythm  Gastrointestinal: soft, non-tender; bowel sounds normal; no masses,  no organomegaly. incisional hernia noted.  moderate, incarcerated, no overlying skin changes,  and at umbilicus  Musculoskeletal: Steady gait and movement  Skin: Cool and moist  Psychiatric: Normal affect, non-agitated, not confused       LABS:  N/a   RADS: N/a Assessment:       Recurrent incisional hernia with incarceration [K43.0]  Plan:     1. Recurrent incisional hernia with incarceration [K43.0]   Discussed the risk of surgery including recurrence, which can be up to 50% in the case of incisional or complex hernias, possible use of prosthetic materials (mesh) and the increased risk of mesh infxn if used, bleeding, chronic pain, post-op infxn, post-op SBO or ileus, and possible re-operation to address said risks. The risks of general anesthetic, if used, includes MI, CVA, sudden death or even reaction to anesthetic medications also discussed. Alternatives include continued observation.  Benefits include possible symptom relief, prevention of incarceration, strangulation, enlargement in size over time, and the risk of emergency surgery in the face of strangulation.   Typical post-op recovery time of 3-5 days with 2 weeks of activity restrictions were also discussed.  ED return precautions given for sudden increase in pain, size of hernia with accompanying fever, nausea, and/or vomiting.  The patient verbalized understanding and all questions were answered to the patient's satisfaction.   2. Patient has elected to proceed with surgical treatment. Procedure will be scheduled, immediately to follow open hysterectomy with Dr. Logan Bores.  Pt understands increased risk associated with combined procedure, as well as increased opt  labs/images/medications/previous chart entries reviewed personally and relevant changes/updates noted above.

## 2023-03-04 NOTE — H&P (Signed)
Subjective:   CC: Recurrent incisional hernia with incarceration [K43.0]  HPI:  Robin Lopez is a 42 y.o. female who was referred by Jarrett Soho, MD for evaluation of above. Symptoms were first noted after lap chole.  Noticeable lump with discomfort.  Scheduled for open hysterctomy in a couple weeks, requesting incisional hernia at umbilicus repaired at same time.   Past Medical History:  has a past medical history of Anemia, Chicken pox, DVT (deep venous thrombosis) (CMS/HHS-HCC), and GERD (gastroesophageal reflux disease).  Past Surgical History:  Past Surgical History:  Procedure Laterality Date   CESAREAN SECTION     CHOLECYSTECTOMY     TUBAL LIGATION     WISDOM TEETH      Family History: family history includes Heart disease in her father; High blood pressure (Hypertension) in her father and mother; Rheum arthritis in her brother.  Social History:  reports that she has never smoked. She has never used smokeless tobacco. She reports that she does not drink alcohol and does not use drugs.  Current Medications: has a current medication list which includes the following prescription(s): albuterol mdi (proventil, ventolin, proair) hfa and ibuprofen.  Allergies:  Allergies as of 03/04/2023 - Reviewed 03/04/2023  Allergen Reaction Noted   Prednisone Itching and Rash 02/26/2013    ROS:  A 15 point review of systems was performed and pertinent positives and negatives noted in HPI   Objective:     BP (!) 146/85   Pulse 74   Ht 167.6 cm (5' 5.98")   Wt (!) 111.6 kg (246 lb 0.5 oz)   LMP 03/02/2023   BMI 39.73 kg/m   Constitutional :  Alert, cooperative, no distress  Lymphatics/Throat:  Supple, no lymphadenopathy  Respiratory:  clear to auscultation bilaterally  Cardiovascular:  regular rate and rhythm  Gastrointestinal: soft, non-tender; bowel sounds normal; no masses,  no organomegaly. incisional hernia noted.  moderate, incarcerated, no overlying skin changes,  and at umbilicus  Musculoskeletal: Steady gait and movement  Skin: Cool and moist  Psychiatric: Normal affect, non-agitated, not confused       LABS:  N/a   RADS: N/a Assessment:       Recurrent incisional hernia with incarceration [K43.0]  Plan:     1. Recurrent incisional hernia with incarceration [K43.0]   Discussed the risk of surgery including recurrence, which can be up to 50% in the case of incisional or complex hernias, possible use of prosthetic materials (mesh) and the increased risk of mesh infxn if used, bleeding, chronic pain, post-op infxn, post-op SBO or ileus, and possible re-operation to address said risks. The risks of general anesthetic, if used, includes MI, CVA, sudden death or even reaction to anesthetic medications also discussed. Alternatives include continued observation.  Benefits include possible symptom relief, prevention of incarceration, strangulation, enlargement in size over time, and the risk of emergency surgery in the face of strangulation.   Typical post-op recovery time of 3-5 days with 2 weeks of activity restrictions were also discussed.  ED return precautions given for sudden increase in pain, size of hernia with accompanying fever, nausea, and/or vomiting.  The patient verbalized understanding and all questions were answered to the patient's satisfaction.   2. Patient has elected to proceed with surgical treatment. Procedure will be scheduled, immediately to follow open hysterectomy with Dr. Logan Bores.  Pt understands increased risk associated with combined procedure, as well as increased opt  labs/images/medications/previous chart entries reviewed personally and relevant changes/updates noted above.

## 2023-03-07 ENCOUNTER — Other Ambulatory Visit: Payer: Self-pay

## 2023-03-07 ENCOUNTER — Encounter
Admission: RE | Admit: 2023-03-07 | Discharge: 2023-03-07 | Disposition: A | Discharge status: Home / Self Care | Payer: 59 | Source: Ambulatory Visit | Attending: Obstetrics and Gynecology | Admitting: Obstetrics and Gynecology

## 2023-03-07 DIAGNOSIS — D500 Iron deficiency anemia secondary to blood loss (chronic): Secondary | ICD-10-CM

## 2023-03-07 DIAGNOSIS — Z01812 Encounter for preprocedural laboratory examination: Secondary | ICD-10-CM

## 2023-03-07 HISTORY — DX: Pneumonia, unspecified organism: J18.9

## 2023-03-07 HISTORY — DX: Nausea with vomiting, unspecified: Z98.890

## 2023-03-07 HISTORY — DX: Other specified postprocedural states: R11.2

## 2023-03-07 NOTE — Patient Instructions (Addendum)
Your procedure is scheduled on: 03/17/23 - Monday Report to the Registration Desk on the 1st floor of the [facility].  To find out your arrival time, please call [phone] between 1PM - 3PM on: 03/14/23 - Friday If your arrival time is 6:00 am, do not arrive before that time as the [facility] entrance doors do not open until 6:00 am.  REMEMBER: Instructions that are not followed completely may result in serious medical risk, up to and including death; or upon the discretion of your surgeon and anesthesiologist your surgery may need to be rescheduled.  Do not eat food or drink any liquids after midnight the night before surgery.  No gum chewing or hard candies.   One week prior to surgery: Stop Anti-inflammatories (NSAIDS) such as Advil, Aleve, Ibuprofen, Motrin, Naproxen, Naprosyn and Aspirin based products such as Excedrin, Goody's Powder, BC Powder. You may take Tylenol if needed for pain up until the day of surgery.  Stop ANY OVER THE COUNTER supplements until after surgery.   TAKE ONLY THESE MEDICATIONS THE MORNING OF SURGERY WITH A SIP OF WATER:  NONE   No Alcohol for 24 hours before or after surgery.  No Smoking including e-cigarettes for 24 hours before surgery.  No chewable tobacco products for at least 6 hours before surgery.  No nicotine patches on the day of surgery.  Do not use any "recreational" drugs for at least a week (preferably 2 weeks) before your surgery.  Please be advised that the combination of cocaine and anesthesia may have negative outcomes, up to and including death. If you test positive for cocaine, your surgery will be cancelled.  On the morning of surgery brush your teeth with toothpaste and water, you may rinse your mouth with mouthwash if you wish. Do not swallow any toothpaste or mouthwash.  Use CHG Soap or wipes as directed on instruction sheet.  Do not wear jewelry, make-up, hairpins, clips or nail polish.  Do not wear lotions,  powders, or perfumes.   Do not shave body hair from the neck down 48 hours before surgery.  Contact lenses, hearing aids and dentures may not be worn into surgery.  Do not bring valuables to the hospital. [facility] is not responsible for any missing/lost belongings or valuables.   Notify your doctor if there is any change in your medical condition (cold, fever, infection).  Wear comfortable clothing (specific to your surgery type) to the hospital.  After surgery, you can help prevent lung complications by doing breathing exercises.  Take deep breaths and cough every 1-2 hours. Your doctor may order a device called an Incentive Spirometer to help you take deep breaths. When coughing or sneezing, hold a pillow firmly against your incision with both hands. This is called "splinting." Doing this helps protect your incision. It also decreases belly discomfort.  If you are being admitted to the hospital overnight, leave your suitcase in the car. After surgery it may be brought to your room.  In case of increased patient census, it may be necessary for you, the patient, to continue your postoperative care in the Same Day Surgery department.  If you are being discharged the day of surgery, you will not be allowed to drive home. You will need a responsible individual to drive you home and stay with you for 24 hours after surgery.   If you are taking public transportation, you will need to have a responsible individual with you.  Please call the Pre-admissions Testing Dept.  at [phone] if you have any questions about these instructions.  Surgery Visitation Policy:  Patients having surgery or a procedure may have two visitors.  Children under the age of 4 must have an adult with them who is not the patient.  Inpatient Visitation:    Visiting hours are 7 a.m. to 8 p.m. Up to four visitors are allowed at one time in a patient room. The visitors may rotate out with other people during  the day.  One visitor age 61 or older may stay with the patient overnight and must be in the room by 8 p.m.    Preparing for Surgery with CHLORHEXIDINE GLUCONATE (CHG) Soap  Chlorhexidine Gluconate (CHG) Soap  o An antiseptic cleaner that kills germs and bonds with the skin to continue killing germs even after washing  o Used for showering the night before surgery and morning of surgery  Before surgery, you can play an important role by reducing the number of germs on your skin.  CHG (Chlorhexidine gluconate) soap is an antiseptic cleanser which kills germs and bonds with the skin to continue killing germs even after washing.  Please do not use if you have an allergy to CHG or antibacterial soaps. If your skin becomes reddened/irritated stop using the CHG.  1. Shower the NIGHT BEFORE SURGERY and the MORNING OF SURGERY with CHG soap.  2. If you choose to wash your hair, wash your hair first as usual with your normal shampoo.  3. After shampooing, rinse your hair and body thoroughly to remove the shampoo.  4. Use CHG as you would any other liquid soap. You can apply CHG directly to the skin and wash gently with a scrungie or a clean washcloth.  5. Apply the CHG soap to your body only from the neck down. Do not use on open wounds or open sores. Avoid contact with your eyes, ears, mouth, and genitals (private parts). Wash face and genitals (private parts) with your normal soap.  6. Wash thoroughly, paying special attention to the area where your surgery will be performed.  7. Thoroughly rinse your body with warm water.  8. Do not shower/wash with your normal soap after using and rinsing off the CHG soap.  9. Pat yourself dry with a clean towel.  10. Wear clean pajamas to bed the night before surgery.  12. Place clean sheets on your bed the night of your first shower and do not sleep with pets.  13. Shower again with the CHG soap on the day of surgery prior to arriving at the  hospital.  14. Do not apply any deodorants/lotions/powders.  15. Please wear clean clothes to the hospital.

## 2023-03-13 ENCOUNTER — Encounter
Admission: RE | Admit: 2023-03-13 | Discharge: 2023-03-13 | Disposition: A | Discharge status: Home / Self Care | Payer: 59 | Source: Ambulatory Visit | Attending: Obstetrics and Gynecology | Admitting: Obstetrics and Gynecology

## 2023-03-13 NOTE — Patient Instructions (Signed)
Your procedure is scheduled on: 03/17/23 - Monday Report to the Registration Desk on the 1st floor of the [facility]. To find out your arrival time, please call [phone] between 1PM - 3PM on: 03/14/23 - Friday If your arrival time is 6:00 am, do not arrive before that time as the [facility] entrance doors do not open until 6:00 am.  REMEMBER: Instructions that are not followed completely may result in serious medical risk, up to and including death; or upon the discretion of your surgeon and anesthesiologist your surgery may need to be rescheduled.  Do not eat food or drink any liquids after midnight the night before surgery.  No gum chewing or hard candies.   One week prior to surgery: Stop Anti-inflammatories (NSAIDS) such as Advil, Aleve, Ibuprofen, Motrin, Naproxen, Naprosyn and Aspirin based products such as Excedrin, Goody's Powder, BC Powder. You may take Tylenol if needed for pain up until the day of surgery.  Stop ANY OVER THE COUNTER supplements until after surgery.   TAKE ONLY THESE MEDICATIONS THE MORNING OF SURGERY WITH A SIP OF WATER:    No Alcohol for 24 hours before or after surgery.  No Smoking including e-cigarettes for 24 hours before surgery.  No chewable tobacco products for at least 6 hours before surgery.  No nicotine patches on the day of surgery.  Do not use any "recreational" drugs for at least a week (preferably 2 weeks) before your surgery.  Please be advised that the combination of cocaine and anesthesia may have negative outcomes, up to and including death. If you test positive for cocaine, your surgery will be cancelled.  On the morning of surgery brush your teeth with toothpaste and water, you may rinse your mouth with mouthwash if you wish. Do not swallow any toothpaste or mouthwash.  Use CHG Soap or wipes as directed on instruction sheet.  Do not wear jewelry, make-up, hairpins, clips or nail polish.  Do not wear lotions, powders, or  perfumes.   Do not shave body hair from the neck down 48 hours before surgery.  Contact lenses, hearing aids and dentures may not be worn into surgery.  Do not bring valuables to the hospital. [facility] is not responsible for any missing/lost belongings or valuables.   Notify your doctor if there is any change in your medical condition (cold, fever, infection).  Wear comfortable clothing (specific to your surgery type) to the hospital.  After surgery, you can help prevent lung complications by doing breathing exercises.  Take deep breaths and cough every 1-2 hours. Your doctor may order a device called an Incentive Spirometer to help you take deep breaths. When coughing or sneezing, hold a pillow firmly against your incision with both hands. This is called "splinting." Doing this helps protect your incision. It also decreases belly discomfort.  If you are being admitted to the hospital overnight, leave your suitcase in the car. After surgery it may be brought to your room.  In case of increased patient census, it may be necessary for you, the patient, to continue your postoperative care in the Same Day Surgery department.  If you are being discharged the day of surgery, you will not be allowed to drive home. You will need a responsible individual to drive you home and stay with you for 24 hours after surgery.   If you are taking public transportation, you will need to have a responsible individual with you.  Please call the Pre-admissions Testing Dept. at [phone]  [phone] if you have any questions about these instructions.  Surgery Visitation Policy:  Patients having surgery or a procedure may have two visitors.  Children under the age of 79 must have an adult with them who is not the patient.  Inpatient Visitation:    Visiting hours are 7 a.m. to 8 p.m. Up to four visitors are allowed at one time in a patient room. The visitors may rotate out with other people during the day.   One visitor age 85 or older may stay with the patient overnight and must be in the room by 8 p.m.    Preparing for Surgery with CHLORHEXIDINE GLUCONATE (CHG) Soap  Chlorhexidine Gluconate (CHG) Soap  o An antiseptic cleaner that kills germs and bonds with the skin to continue killing germs even after washing  o Used for showering the night before surgery and morning of surgery  Before surgery, you can play an important role by reducing the number of germs on your skin.  CHG (Chlorhexidine gluconate) soap is an antiseptic cleanser which kills germs and bonds with the skin to continue killing germs even after washing.  Please do not use if you have an allergy to CHG or antibacterial soaps. If your skin becomes reddened/irritated stop using the CHG.  1. Shower the NIGHT BEFORE SURGERY and the MORNING OF SURGERY with CHG soap.  2. If you choose to wash your hair, wash your hair first as usual with your normal shampoo.  3. After shampooing, rinse your hair and body thoroughly to remove the shampoo.  4. Use CHG as you would any other liquid soap. You can apply CHG directly to the skin and wash gently with a scrungie or a clean washcloth.  5. Apply the CHG soap to your body only from the neck down. Do not use on open wounds or open sores. Avoid contact with your eyes, ears, mouth, and genitals (private parts). Wash face and genitals (private parts) with your normal soap.  6. Wash thoroughly, paying special attention to the area where your surgery will be performed.  7. Thoroughly rinse your body with warm water.  8. Do not shower/wash with your normal soap after using and rinsing off the CHG soap.  9. Pat yourself dry with a clean towel.  10. Wear clean pajamas to bed the night before surgery.  12. Place clean sheets on your bed the night of your first shower and do not sleep with pets.  13. Shower again with the CHG soap on the day of surgery prior to arriving at the  hospital.  14. Do not apply any deodorants/lotions/powders.  15. Please wear clean clothes to the hospital.

## 2023-03-14 ENCOUNTER — Encounter
Admission: RE | Admit: 2023-03-14 | Discharge: 2023-03-14 | Disposition: A | Discharge status: Home / Self Care | Payer: 59 | Source: Ambulatory Visit | Attending: Obstetrics and Gynecology | Admitting: Obstetrics and Gynecology

## 2023-03-14 DIAGNOSIS — D509 Iron deficiency anemia, unspecified: Secondary | ICD-10-CM | POA: Insufficient documentation

## 2023-03-14 DIAGNOSIS — Z01812 Encounter for preprocedural laboratory examination: Secondary | ICD-10-CM | POA: Insufficient documentation

## 2023-03-14 DIAGNOSIS — Z01818 Encounter for other preprocedural examination: Secondary | ICD-10-CM

## 2023-03-14 DIAGNOSIS — D500 Iron deficiency anemia secondary to blood loss (chronic): Secondary | ICD-10-CM

## 2023-03-14 LAB — CBC
HCT: 38.7 % (ref 36.0–46.0)
Hemoglobin: 13.6 g/dL (ref 12.0–15.0)
MCH: 29.1 pg (ref 26.0–34.0)
MCHC: 35.1 g/dL (ref 30.0–36.0)
MCV: 82.9 fL (ref 80.0–100.0)
Platelets: 322 10*3/uL (ref 150–400)
RBC: 4.67 MIL/uL (ref 3.87–5.11)
RDW: 13.6 % (ref 11.5–15.5)
WBC: 16 10*3/uL — ABNORMAL HIGH (ref 4.0–10.5)
nRBC: 0 % (ref 0.0–0.2)

## 2023-03-14 LAB — TYPE AND SCREEN
ABO/RH(D): A POS
Antibody Screen: NEGATIVE

## 2023-03-17 ENCOUNTER — Inpatient Hospital Stay: Payer: 59 | Admitting: Anesthesiology

## 2023-03-17 ENCOUNTER — Inpatient Hospital Stay: Payer: 59 | Admitting: Urgent Care

## 2023-03-17 ENCOUNTER — Encounter
Admission: RE | Disposition: A | Discharge status: Home / Self Care | Payer: Self-pay | Source: Home / Self Care | Attending: Obstetrics and Gynecology

## 2023-03-17 ENCOUNTER — Other Ambulatory Visit: Payer: Self-pay

## 2023-03-17 ENCOUNTER — Inpatient Hospital Stay
Admission: RE | Admit: 2023-03-17 | Discharge: 2023-03-19 | DRG: 742 | Disposition: A | Discharge status: Home / Self Care | Payer: 59 | Attending: Obstetrics and Gynecology | Admitting: Obstetrics and Gynecology

## 2023-03-17 ENCOUNTER — Encounter: Payer: Self-pay | Admitting: Obstetrics and Gynecology

## 2023-03-17 DIAGNOSIS — Z01812 Encounter for preprocedural laboratory examination: Secondary | ICD-10-CM

## 2023-03-17 DIAGNOSIS — D259 Leiomyoma of uterus, unspecified: Principal | ICD-10-CM

## 2023-03-17 DIAGNOSIS — K430 Incisional hernia with obstruction, without gangrene: Secondary | ICD-10-CM | POA: Diagnosis present

## 2023-03-17 DIAGNOSIS — N920 Excessive and frequent menstruation with regular cycle: Secondary | ICD-10-CM | POA: Diagnosis present

## 2023-03-17 DIAGNOSIS — N938 Other specified abnormal uterine and vaginal bleeding: Secondary | ICD-10-CM

## 2023-03-17 DIAGNOSIS — Z6839 Body mass index (BMI) 39.0-39.9, adult: Secondary | ICD-10-CM

## 2023-03-17 DIAGNOSIS — Z9889 Other specified postprocedural states: Principal | ICD-10-CM

## 2023-03-17 DIAGNOSIS — D649 Anemia, unspecified: Secondary | ICD-10-CM | POA: Diagnosis present

## 2023-03-17 DIAGNOSIS — Z86718 Personal history of other venous thrombosis and embolism: Secondary | ICD-10-CM

## 2023-03-17 HISTORY — PX: HYSTERECTOMY ABDOMINAL WITH SALPINGECTOMY: SHX6725

## 2023-03-17 HISTORY — PX: VENTRAL HERNIA REPAIR: SHX424

## 2023-03-17 LAB — POCT PREGNANCY, URINE: Preg Test, Ur: NEGATIVE

## 2023-03-17 SURGERY — HYSTERECTOMY, TOTAL, ABDOMINAL, WITH SALPINGECTOMY
Anesthesia: General

## 2023-03-17 SURGERY — REPAIR, HERNIA, VENTRAL
Anesthesia: General

## 2023-03-17 MED ORDER — FENTANYL CITRATE (PF) 100 MCG/2ML IJ SOLN
INTRAMUSCULAR | Status: AC
  Filled 2023-03-17: qty 2

## 2023-03-17 MED ORDER — 0.9 % SODIUM CHLORIDE (POUR BTL) OPTIME
TOPICAL | Status: DC | PRN
  Administered 2023-03-17: 1000 mL

## 2023-03-17 MED ORDER — CHLORHEXIDINE GLUCONATE CLOTH 2 % EX PADS: 6.0000 | MEDICATED_PAD | Freq: Once | CUTANEOUS | Status: DC

## 2023-03-17 MED ORDER — LACTATED RINGERS IV SOLN: INTRAVENOUS | Status: DC

## 2023-03-17 MED ORDER — CELECOXIB 200 MG PO CAPS
ORAL_CAPSULE | ORAL | Status: AC
  Filled 2023-03-17: qty 1

## 2023-03-17 MED ORDER — CHLORHEXIDINE GLUCONATE 0.12 % MT SOLN
OROMUCOSAL | Status: AC
  Filled 2023-03-17: qty 15

## 2023-03-17 MED ORDER — OXYCODONE HCL 5 MG PO TABS
ORAL_TABLET | ORAL | Status: AC
  Filled 2023-03-17: qty 1

## 2023-03-17 MED ORDER — ALBUTEROL SULFATE HFA 108 (90 BASE) MCG/ACT IN AERS
INHALATION_SPRAY | RESPIRATORY_TRACT | Status: AC
  Filled 2023-03-17: qty 6.7

## 2023-03-17 MED ORDER — FAMOTIDINE 20 MG PO TABS
20.0000 mg | ORAL_TABLET | Freq: Once | ORAL | Status: AC
  Administered 2023-03-17: 20 mg via ORAL

## 2023-03-17 MED ORDER — BUPIVACAINE HCL (PF) 0.5 % IJ SOLN
INTRAMUSCULAR | Status: AC
  Filled 2023-03-17: qty 30

## 2023-03-17 MED ORDER — STERILE WATER FOR IRRIGATION IR SOLN
Status: DC | PRN
  Administered 2023-03-17: 1000 mL

## 2023-03-17 MED ORDER — ROCURONIUM BROMIDE 100 MG/10ML IV SOLN
INTRAVENOUS | Status: DC | PRN
  Administered 2023-03-17: 50 mg via INTRAVENOUS
  Administered 2023-03-17: 20 mg via INTRAVENOUS
  Administered 2023-03-17: 10 mg via INTRAVENOUS
  Administered 2023-03-17 (×2): 20 mg via INTRAVENOUS

## 2023-03-17 MED ORDER — CHLORHEXIDINE GLUCONATE 0.12 % MT SOLN
15.0000 mL | Freq: Once | OROMUCOSAL | Status: AC
  Administered 2023-03-17: 15 mL via OROMUCOSAL

## 2023-03-17 MED ORDER — IBUPROFEN 600 MG PO TABS
600.0000 mg | ORAL_TABLET | Freq: Four times a day (QID) | ORAL | Status: DC
  Administered 2023-03-17 – 2023-03-19 (×8): 600 mg via ORAL
  Filled 2023-03-17 (×8): qty 1

## 2023-03-17 MED ORDER — FAMOTIDINE 20 MG PO TABS
ORAL_TABLET | ORAL | Status: AC
  Filled 2023-03-17: qty 1

## 2023-03-17 MED ORDER — ROCURONIUM BROMIDE 10 MG/ML (PF) SYRINGE
PREFILLED_SYRINGE | INTRAVENOUS | Status: AC
  Filled 2023-03-17: qty 10

## 2023-03-17 MED ORDER — ONDANSETRON HCL 4 MG/2ML IJ SOLN: 4.0000 mg | Freq: Once | INTRAMUSCULAR | Status: DC | PRN

## 2023-03-17 MED ORDER — ORAL CARE MOUTH RINSE: 15.0000 mL | Freq: Once | OROMUCOSAL | Status: AC

## 2023-03-17 MED ORDER — ACETAMINOPHEN 10 MG/ML IV SOLN
INTRAVENOUS | Status: AC
  Filled 2023-03-17: qty 100

## 2023-03-17 MED ORDER — ALBUMIN HUMAN 5 % IV SOLN
INTRAVENOUS | Status: AC
  Filled 2023-03-17: qty 250

## 2023-03-17 MED ORDER — CELECOXIB 200 MG PO CAPS
200.0000 mg | ORAL_CAPSULE | ORAL | Status: AC
  Administered 2023-03-17: 200 mg via ORAL

## 2023-03-17 MED ORDER — OXYCODONE HCL 5 MG/5ML PO SOLN: 5.0000 mg | Freq: Once | ORAL | Status: AC | PRN

## 2023-03-17 MED ORDER — PROPOFOL 10 MG/ML IV BOLUS
INTRAVENOUS | Status: DC | PRN
  Administered 2023-03-17: 100 mg via INTRAVENOUS

## 2023-03-17 MED ORDER — LIDOCAINE HCL (PF) 2 % IJ SOLN
INTRAMUSCULAR | Status: AC
  Filled 2023-03-17: qty 5

## 2023-03-17 MED ORDER — MIDAZOLAM HCL 2 MG/2ML IJ SOLN
INTRAMUSCULAR | Status: DC | PRN
  Administered 2023-03-17: 2 mg via INTRAVENOUS

## 2023-03-17 MED ORDER — ONDANSETRON HCL 4 MG/2ML IJ SOLN
INTRAMUSCULAR | Status: DC | PRN
Start: 2023-03-17 — End: 2023-03-17
  Administered 2023-03-17: 4 mg via INTRAVENOUS

## 2023-03-17 MED ORDER — KETOROLAC TROMETHAMINE 30 MG/ML IJ SOLN
INTRAMUSCULAR | Status: AC
  Filled 2023-03-17: qty 1

## 2023-03-17 MED ORDER — PROPOFOL 500 MG/50ML IV EMUL
INTRAVENOUS | Status: DC | PRN
  Administered 2023-03-17: 150 ug/kg/min via INTRAVENOUS

## 2023-03-17 MED ORDER — CEFAZOLIN SODIUM-DEXTROSE 2-4 GM/100ML-% IV SOLN
INTRAVENOUS | Status: AC
  Filled 2023-03-17: qty 100

## 2023-03-17 MED ORDER — ENOXAPARIN SODIUM 40 MG/0.4ML IJ SOSY
PREFILLED_SYRINGE | INTRAMUSCULAR | Status: AC
  Filled 2023-03-17: qty 0.4

## 2023-03-17 MED ORDER — CEFAZOLIN SODIUM-DEXTROSE 2-4 GM/100ML-% IV SOLN
2.0000 g | INTRAVENOUS | Status: AC
  Administered 2023-03-17: 2 g via INTRAVENOUS

## 2023-03-17 MED ORDER — GABAPENTIN 300 MG PO CAPS
ORAL_CAPSULE | ORAL | Status: AC
  Filled 2023-03-17: qty 1

## 2023-03-17 MED ORDER — BUPIVACAINE-MELOXICAM ER 200-6 MG/7ML IJ SOLN
INTRAMUSCULAR | Status: DC | PRN
  Administered 2023-03-17: 7 mL

## 2023-03-17 MED ORDER — LIDOCAINE HCL (CARDIAC) PF 100 MG/5ML IV SOSY
PREFILLED_SYRINGE | INTRAVENOUS | Status: DC | PRN
  Administered 2023-03-17: 100 mg via INTRAVENOUS

## 2023-03-17 MED ORDER — ENOXAPARIN SODIUM 60 MG/0.6ML IJ SOSY
50.0000 mg | PREFILLED_SYRINGE | INTRAMUSCULAR | Status: AC
  Administered 2023-03-17: 50 mg via SUBCUTANEOUS
  Filled 2023-03-17: qty 0.6

## 2023-03-17 MED ORDER — GABAPENTIN 300 MG PO CAPS
300.0000 mg | ORAL_CAPSULE | ORAL | Status: AC
  Administered 2023-03-17: 300 mg via ORAL

## 2023-03-17 MED ORDER — OXYCODONE HCL 5 MG PO TABS
5.0000 mg | ORAL_TABLET | Freq: Once | ORAL | Status: AC | PRN
  Administered 2023-03-17: 5 mg via ORAL

## 2023-03-17 MED ORDER — FENTANYL CITRATE (PF) 100 MCG/2ML IJ SOLN
25.0000 ug | INTRAMUSCULAR | Status: DC | PRN
  Administered 2023-03-17 (×2): 25 ug via INTRAVENOUS

## 2023-03-17 MED ORDER — SUGAMMADEX SODIUM 500 MG/5ML IV SOLN
INTRAVENOUS | Status: DC | PRN
  Administered 2023-03-17: 200 mg via INTRAVENOUS

## 2023-03-17 MED ORDER — EPINEPHRINE PF 1 MG/ML IJ SOLN
INTRAMUSCULAR | Status: AC
  Filled 2023-03-17: qty 1

## 2023-03-17 MED ORDER — ACETAMINOPHEN 10 MG/ML IV SOLN: 1000.0000 mg | Freq: Once | INTRAVENOUS | Status: DC | PRN

## 2023-03-17 MED ORDER — PROPOFOL 1000 MG/100ML IV EMUL
INTRAVENOUS | Status: AC
  Filled 2023-03-17: qty 100

## 2023-03-17 MED ORDER — MIDAZOLAM HCL 2 MG/2ML IJ SOLN
INTRAMUSCULAR | Status: AC
  Filled 2023-03-17: qty 2

## 2023-03-17 MED ORDER — DEXTROSE IN LACTATED RINGERS 5 % IV SOLN: INTRAVENOUS | Status: DC

## 2023-03-17 MED ORDER — BUPIVACAINE-MELOXICAM ER 200-6 MG/7ML IJ SOLN
INTRAMUSCULAR | Status: AC
  Filled 2023-03-17: qty 1

## 2023-03-17 MED ORDER — BUPIVACAINE LIPOSOME 1.3 % IJ SUSP
INTRAMUSCULAR | Status: AC
  Filled 2023-03-17: qty 20

## 2023-03-17 MED ORDER — ALBUTEROL SULFATE HFA 108 (90 BASE) MCG/ACT IN AERS
INHALATION_SPRAY | RESPIRATORY_TRACT | Status: DC | PRN
  Administered 2023-03-17 (×2): 6 via RESPIRATORY_TRACT

## 2023-03-17 MED ORDER — FENTANYL CITRATE (PF) 100 MCG/2ML IJ SOLN
INTRAMUSCULAR | Status: DC | PRN
  Administered 2023-03-17 (×2): 50 ug via INTRAVENOUS

## 2023-03-17 MED ORDER — ONDANSETRON HCL 4 MG/2ML IJ SOLN
INTRAMUSCULAR | Status: AC
  Filled 2023-03-17: qty 2

## 2023-03-17 MED ORDER — POVIDONE-IODINE 10 % EX SWAB: 2.0000 | Freq: Once | CUTANEOUS | Status: DC

## 2023-03-17 MED ORDER — ALBUMIN HUMAN 5 % IV SOLN: INTRAVENOUS | Status: DC | PRN

## 2023-03-17 MED ORDER — PROPOFOL 10 MG/ML IV BOLUS
INTRAVENOUS | Status: AC
  Filled 2023-03-17: qty 20

## 2023-03-17 MED ORDER — SIMETHICONE 80 MG PO CHEW
80.0000 mg | CHEWABLE_TABLET | Freq: Four times a day (QID) | ORAL | Status: DC | PRN
  Administered 2023-03-18: 80 mg via ORAL
  Filled 2023-03-17: qty 1

## 2023-03-17 MED ORDER — ACETAMINOPHEN 500 MG PO TABS
ORAL_TABLET | ORAL | Status: AC
  Filled 2023-03-17: qty 2

## 2023-03-17 MED ORDER — OXYCODONE-ACETAMINOPHEN 5-325 MG PO TABS
1.0000 | ORAL_TABLET | ORAL | Status: DC | PRN
  Administered 2023-03-18 – 2023-03-19 (×4): 1 via ORAL
  Filled 2023-03-17: qty 1
  Filled 2023-03-17: qty 2
  Filled 2023-03-17 (×3): qty 1

## 2023-03-17 MED ORDER — ACETAMINOPHEN 500 MG PO TABS
1000.0000 mg | ORAL_TABLET | ORAL | Status: AC
  Administered 2023-03-17: 1000 mg via ORAL

## 2023-03-17 SURGICAL SUPPLY — 71 items
ADH LQ OCL WTPRF AMP STRL LF (MISCELLANEOUS)
ADH SKN CLS APL DERMABOND .7 (GAUZE/BANDAGES/DRESSINGS) ×4
ADHESIVE MASTISOL STRL (MISCELLANEOUS) ×2 IMPLANT
APL PRP STRL LF DISP 70% ISPRP (MISCELLANEOUS) ×4
APL SKNCLS STERI-STRIP NONHPOA (GAUZE/BANDAGES/DRESSINGS) ×2
BENZOIN TINCTURE PRP APPL 2/3 (GAUZE/BANDAGES/DRESSINGS) IMPLANT
BLADE SURG 15 STRL LF DISP TIS (BLADE) ×2 IMPLANT
BLADE SURG 15 STRL SS (BLADE) ×2
BLADE SURG SZ10 CARB STEEL (BLADE) ×2 IMPLANT
CHLORAPREP W/TINT 26 (MISCELLANEOUS) ×4 IMPLANT
DERMABOND ADVANCED .7 DNX12 (GAUZE/BANDAGES/DRESSINGS) ×2 IMPLANT
DRAPE LAPAROTOMY 100X77 ABD (DRAPES) ×2 IMPLANT
DRAPE LAPAROTOMY TRNSV 106X77 (MISCELLANEOUS) IMPLANT
DRAPE UTILITY W/TAPE 26X15 (DRAPES) IMPLANT
DRSG TELFA 3X8 NADH STRL (GAUZE/BANDAGES/DRESSINGS) ×2 IMPLANT
ELECT BLADE 6 FLAT ULTRCLN (ELECTRODE) ×2 IMPLANT
ELECT BLADE 6.5 EXT (BLADE) ×2 IMPLANT
ELECT CAUTERY BLADE 6.4 (BLADE) ×4 IMPLANT
ELECT REM PT RETURN 9FT ADLT (ELECTROSURGICAL) ×2
ELECTRODE REM PT RTRN 9FT ADLT (ELECTROSURGICAL) ×4 IMPLANT
GAUZE 4X4 16PLY ~~LOC~~+RFID DBL (SPONGE) ×6 IMPLANT
GAUZE SPONGE 4X4 12PLY STRL (GAUZE/BANDAGES/DRESSINGS) ×2 IMPLANT
GLOVE BIOGEL PI IND STRL 7.0 (GLOVE) ×2 IMPLANT
GLOVE PI ORTHO PRO STRL 7.5 (GLOVE) ×4 IMPLANT
GLOVE SURG SYN 6.5 ES PF (GLOVE) ×2 IMPLANT
GLOVE SURG SYN 6.5 PF PI (GLOVE) ×2 IMPLANT
GOWN STRL REUS W/ TWL LRG LVL3 (GOWN DISPOSABLE) ×4 IMPLANT
GOWN STRL REUS W/ TWL XL LVL3 (GOWN DISPOSABLE) ×4 IMPLANT
GOWN STRL REUS W/TWL LRG LVL3 (GOWN DISPOSABLE) ×4
GOWN STRL REUS W/TWL XL LVL3 (GOWN DISPOSABLE) ×4
KIT TURNOVER KIT A (KITS) ×4 IMPLANT
LABEL OR SOLS (LABEL) ×4 IMPLANT
LIGASURE IMPACT 36 18CM CVD LR (INSTRUMENTS) IMPLANT
MANIFOLD NEPTUNE II (INSTRUMENTS) ×4 IMPLANT
MESH VENTRALEX ST 8CM LRG (Mesh General) IMPLANT
NDL HYPO 22X1.5 SAFETY MO (MISCELLANEOUS) ×4 IMPLANT
NEEDLE HYPO 22X1.5 SAFETY MO (MISCELLANEOUS) ×4 IMPLANT
NS IRRIG 1000ML POUR BTL (IV SOLUTION) ×2 IMPLANT
NS IRRIG 500ML POUR BTL (IV SOLUTION) ×2 IMPLANT
PACK BASIN MAJOR ARMC (MISCELLANEOUS) ×2 IMPLANT
PACK BASIN MINOR ARMC (MISCELLANEOUS) ×2 IMPLANT
PAD OB MATERNITY 4.3X12.25 (PERSONAL CARE ITEMS) ×2 IMPLANT
PENCIL SMOKE EVACUATOR (MISCELLANEOUS) IMPLANT
RETRACTOR WND ALEXIS-O 25 LRG (MISCELLANEOUS) IMPLANT
RTRCTR C-SECT PINK 34CM XLRG (MISCELLANEOUS) IMPLANT
RTRCTR WOUND ALEXIS O 25CM LRG (MISCELLANEOUS) ×2
SCRUB CHG 4% DYNA-HEX 4OZ (MISCELLANEOUS) ×2 IMPLANT
SPONGE T-LAP 18X18 ~~LOC~~+RFID (SPONGE) ×6 IMPLANT
STAPLER SKIN PROX 35W (STAPLE) IMPLANT
STRIP CLOSURE SKIN 1/2X4 (GAUZE/BANDAGES/DRESSINGS) ×2 IMPLANT
SUT ETHIBOND NAB MO 7 #0 18IN (SUTURE) ×2 IMPLANT
SUT MNCRL 4-0 (SUTURE) ×2
SUT MNCRL 4-0 27XMFL (SUTURE) ×2
SUT VIC AB 0 CT1 27 (SUTURE)
SUT VIC AB 0 CT1 27XCR 8 STRN (SUTURE) ×4 IMPLANT
SUT VIC AB 1 CT1 18XCR BRD 8 (SUTURE) ×2 IMPLANT
SUT VIC AB 1 CT1 36 (SUTURE) ×4 IMPLANT
SUT VIC AB 1 CT1 8-18 (SUTURE) ×4
SUT VIC AB 3-0 SH 27 (SUTURE) ×4
SUT VIC AB 3-0 SH 27X BRD (SUTURE) ×4 IMPLANT
SUT VICRYL AB 3-0 FS1 BRD 27IN (SUTURE) ×4 IMPLANT
SUT VICRYL+ 3-0 36IN CT-1 (SUTURE) ×2 IMPLANT
SUTURE MNCRL 4-0 27XMF (SUTURE) ×2 IMPLANT
SYR 10ML LL (SYRINGE) ×4 IMPLANT
SYR 20ML LL LF (SYRINGE) ×2 IMPLANT
TAPE TRANSPORE STRL 2 31045 (GAUZE/BANDAGES/DRESSINGS) IMPLANT
TOWEL OR 17X26 4PK STRL BLUE (TOWEL DISPOSABLE) ×2 IMPLANT
TRAP FLUID SMOKE EVACUATOR (MISCELLANEOUS) ×4 IMPLANT
TRAY FOLEY MTR SLVR 16FR STAT (SET/KITS/TRAYS/PACK) ×2 IMPLANT
WATER STERILE IRR 1000ML POUR (IV SOLUTION) ×2 IMPLANT
WATER STERILE IRR 500ML POUR (IV SOLUTION) ×4 IMPLANT

## 2023-03-17 NOTE — Anesthesia Preprocedure Evaluation (Addendum)
Anesthesia Evaluation  Patient identified by MRN, date of birth, ID band Patient awake    Reviewed: Allergy & Precautions, H&P , NPO status , Patient's Chart, lab work & pertinent test results, reviewed documented beta blocker date and time   History of Anesthesia Complications (+) PONV and history of anesthetic complications  Airway Mallampati: II  TM Distance: >3 FB Neck ROM: full    Dental  (+) Dental Advidsory Given, Missing, Teeth Intact   Pulmonary neg pulmonary ROS, Continuous Positive Airway Pressure Ventilation    Pulmonary exam normal breath sounds clear to auscultation       Cardiovascular Exercise Tolerance: Good negative cardio ROS Normal cardiovascular exam Rhythm:regular Rate:Normal  LLE DVT on Xarelto   Neuro/Psych negative neurological ROS  negative psych ROS   GI/Hepatic Neg liver ROS,GERD  ,,  Endo/Other  neg diabetes  Morbid obesity  Renal/GU negative Renal ROS  negative genitourinary   Musculoskeletal   Abdominal   Peds  Hematology  (+) Blood dyscrasia, anemia   Anesthesia Other Findings Past Medical History: No date: Anemia No date: Left leg DVT (HCC) No date: Menorrhagia No date: Pneumonia No date: PONV (postoperative nausea and vomiting)   Reproductive/Obstetrics negative OB ROS Fibroids                              Anesthesia Physical Anesthesia Plan  ASA: 3  Anesthesia Plan: General   Post-op Pain Management:    Induction: Intravenous  PONV Risk Score and Plan: 4 or greater and Propofol infusion, TIVA, Midazolam, Treatment may vary due to age or medical condition and Ondansetron  Airway Management Planned: Oral ETT  Additional Equipment:   Intra-op Plan:   Post-operative Plan: Extubation in OR  Informed Consent: I have reviewed the patients History and Physical, chart, labs and discussed the procedure including the risks, benefits and  alternatives for the proposed anesthesia with the patient or authorized representative who has indicated his/her understanding and acceptance.     Dental Advisory Given  Plan Discussed with: Anesthesiologist, CRNA and Surgeon  Anesthesia Plan Comments:         Anesthesia Quick Evaluation

## 2023-03-17 NOTE — Op Note (Signed)
Preoperative diagnosis: incisional hernia, initial, reducible Postoperative diagnosis: same  Procedure:  Open umbilical hernia repair with mesh  Anesthesia: LMA  Surgeon: Sung Amabile  Wound Classification: Clean  Specimen: none  Complications: None  Estimated Blood Loss: minimal  Indications:see HPI  Findings: 3.1cm x 1.5 cm incsional hernia 4. Tension free repair achieved with mesh and suture 5. Adequate hemostasis  Description of procedure: The patient was brought to the operating room and general anesthesia was induced. A time-out was completed verifying correct patient, procedure, site, positioning, and implant(s) and/or special equipment prior to beginning this procedure. Antibiotics were administered prior to making the incision. SCDs placed. The anterior abdominal wall was prepped and draped in the standard sterile fashion, over the previous prep and draping from Dr. Michel Harrow portion of the procedure.  An infraumbilical incision was made after infusing the preplanned incision with half percent Marcaine.  Dissection carried down to fascia where the umbilical stalk was noted.  The stalk was transected and there was noted to be a 3cm x 1.5cm umbilical hernia.  The preperitoneal fat contents were dissected off the surrounding structures and reduced.  Hemostasis was confirmed prior to reducing the actual contents. Due to size, decision was made to proceed with a mesh repair.    A 8.6 cm umbilical mesh was placed within the preperitoneal cavity through the present defect and secured in place on the abdominal wall using interrupted 0 Ethibond sutures.  Once the mesh was noted to be laying flat and secured to the abdominal wall the defect itself was primary closed using 0 Ethibond in a figure-of-eight fashion.  The fascia as well as the skin incision was then infused with zynrelef.  After confirming hemostasis, wound irrigated and closed in a multilayer fashion, using 3-0 Vicryl for the deep  dermal layer in an interrupted fashion and running 4-0 Monocryl in a subcuticular fashion.  Wound was then dressed with Dermabond. Small burn noted at edge of skin incision but viable.  Patient was then successfully awakened and transferred to PACU in stable condition.  At the end of the procedure sponge and instrument counts were correct

## 2023-03-17 NOTE — Anesthesia Procedure Notes (Signed)
Procedure Name: Intubation Date/Time: 03/17/2023 10:22 AM  Performed by: Elisabeth Pigeon, CRNAPre-anesthesia Checklist: Patient identified, Emergency Drugs available, Suction available, Patient being monitored and Timeout performed Patient Re-evaluated:Patient Re-evaluated prior to induction Oxygen Delivery Method: Circle system utilized Preoxygenation: Pre-oxygenation with 100% oxygen Induction Type: IV induction Ventilation: Mask ventilation without difficulty Laryngoscope Size: McGraph and 4 (DL X1 with MAC3 Grade 1 view, esophageal intubation. Proceded with McGrath) Grade View: Grade I Tube type: Oral Tube size: 7.0 mm Number of attempts: 2 Airway Equipment and Method: Stylet Placement Confirmation: ETT inserted through vocal cords under direct vision, positive ETCO2 and breath sounds checked- equal and bilateral Secured at: 22 cm Tube secured with: Tape Dental Injury: Teeth and Oropharynx as per pre-operative assessment  Difficulty Due To: Difficult Airway- due to immobile epiglottis

## 2023-03-17 NOTE — H&P (Signed)
Expand All Collapse All                                                      PRE-OPERATIVE HISTORY AND PHYSICAL EXAM   PCP:  Kristian Covey, MD Subjective:    HPI:  Robin Lopez is a 42 y.o. [id number].  Patient's last menstrual period was 01/23/2023.  She presents today for a pre-op discussion and PE.   She has the following symptoms: Large symptomatic uterine fibroids often resulting in very heavy menses and subsequent anemia. Of significant note patient has had a prior cesarean delivery She is trying to schedule umbilical hernia repair surgery concurrently. Patient has a prior history of DVT -has been off anticoagulation for greater than 6 months.   Review of Systems:    Constitutional: Denied constitutional symptoms, night sweats, recent illness, fatigue, fever, insomnia and weight loss.  Eyes: Denied eye symptoms, eye pain, photophobia, vision change and visual disturbance.  Ears/Nose/Throat/Neck: Denied ear, nose, throat or neck symptoms, hearing loss, nasal discharge, sinus congestion and sore throat.  Cardiovascular: Denied cardiovascular symptoms, arrhythmia, chest pain/pressure, edema, exercise intolerance, orthopnea and palpitations.  Respiratory: Denied pulmonary symptoms, asthma, pleuritic pain, productive sputum, cough, dyspnea and wheezing.  Gastrointestinal: Denied, gastro-esophageal reflux, melena, nausea and vomiting.  Genitourinary: See HPI for additional information.  Musculoskeletal: Denied musculoskeletal symptoms, stiffness, swelling, muscle weakness and myalgia.  Dermatologic: Denied dermatology symptoms, rash and scar.  Neurologic: Denied neurology symptoms, dizziness, headache, neck pain and syncope.  Psychiatric: Denied psychiatric symptoms, anxiety and depression.  Endocrine: Denied endocrine symptoms including hot flashes and night sweats.                    OB History  Gravida Para Term Preterm AB Living  2 2 2     2   SAB IAB Ectopic Multiple  Live Births             2        # Outcome Date GA Lbr Len/2nd Weight Sex Delivery Anes PTL Lv  2 Term 2003         CS-Unspec     LIV  1 Term 2001         CS-Unspec     LIV          Past Medical History:  Diagnosis Date   Anemia     Left leg DVT (HCC)     Menorrhagia                 Past Surgical History:  Procedure Laterality Date   CESAREAN SECTION        x 2   CHOLECYSTECTOMY       TUBAL LIGATION       WISDOM TOOTH EXTRACTION               SOCIAL HISTORY:   Tobacco Use History  Social History       Tobacco Use  Smoking Status Never  Smokeless Tobacco Never      Social History       Substance and Sexual Activity  Alcohol Use No     Social History       Substance and Sexual Activity  Drug Use No           Family History  Problem Relation Age of  Onset   Liver cancer Father     Hypertension Father     CAD Father     Deep vein thrombosis Brother     Stomach cancer Maternal Grandfather     Colon cancer Neg Hx            ALLERGIES:  Prednisone   MEDS:              Medications Ordered Prior to Encounter  No current outpatient medications on file prior to visit.    No current facility-administered medications on file prior to visit.        No orders of the defined types were placed in this encounter.     Physical examination BP 122/73   Pulse 81   Ht 5\' 6"  (1.676 m)   Wt 243 lb 11.2 oz (110.5 kg)   LMP 01/23/2023   BMI 39.33 kg/m    General NAD, Conversant  HEENT Atraumatic; Op clear with mmm.  Normo-cephalic. Pupils reactive. Anicteric sclerae  Thyroid/Neck Smooth without nodularity or enlargement. Normal ROM.  Neck Supple.  Skin No rashes, lesions or ulceration. Normal palpated skin turgor. No nodularity.  Breasts: No masses or discharge.  Symmetric.  No axillary adenopathy.  Lungs: Clear to auscultation.No rales or wheezes. Normal Respiratory effort, no retractions.  Heart: NSR.  No murmurs or rubs appreciated. No  peripheral edema  Abdomen: Soft.  Non-tender.  Large uterine fibroids/uterine mass above the umbilicus no HSM.  Extremities: Moves all appropriately.  Normal ROM for age. No lymphadenopathy.  Neuro: Oriented to PPT.  Normal mood. Normal affect.              Pelvic:    Vulva: Normal appearance.  No lesions.   Vagina: No lesions or abnormalities noted.   Support: Normal pelvic support.   Urethra No masses tenderness or scarring.   Meatus Normal size without lesions or prolapse.   Cervix: Normal ectropion.  No lesions.   Anus: Normal exam.  No lesions.   Perineum: Normal exam.  No lesions.         Bimanual    Uterus: Consistent with large uterine fibroids (see ultrasound)    Adnexae: No masses.  Non-tender to palpation.    Cul-de-sac: Negative for abnormality.      Assessment:    G2P2002     Patient Active Problem List    Diagnosis Date Noted   Leukocytosis 04/08/2022   Iron deficiency anemia 11/20/2021   Blood loss anemia 06/21/2021   History of DVT (deep vein thrombosis) 05/16/2021   Fibroids 05/16/2021      1. Pre-op exam   2. Fibroids                Fibroids are too large to deliver vaginally.  Abdominal surgery necessary.  Possible vertical incision based on the size of the fibroids, the patient's anatomy and especially in light of umbilical hernia surgery.   Plan:    Orders: No orders of the defined types were placed in this encounter.     1.  TAH bilateral salpingectomy possible concurrent umbilical hernia surgery

## 2023-03-17 NOTE — Interval H&P Note (Signed)
History and Physical Interval Note:  03/17/2023 9:55 AM  Robin Lopez  has presented today for surgery, with the diagnosis of Uterine Fibroids recurrent incisional hernia w/ incarceration K43.0.  The various methods of treatment have been discussed with the patient and family. After consideration of risks, benefits and other options for treatment, the patient has consented to  Procedure(s): TOTAL ABDOMINAL HYSTERECTOMY  WITH BILATERAL SALPINGECTOMY (Bilateral) HERNIA REPAIR VENTRAL ADULT (N/A) as a surgical intervention.  The patient's history has been reviewed, patient examined, no change in status, stable for surgery.  I have reviewed the patient's chart and labs.  Questions were answered to the patient's satisfaction.     Kaleab Frasier Tonna Boehringer

## 2023-03-17 NOTE — Transfer of Care (Signed)
Immediate Anesthesia Transfer of Care Note  Patient: Robin Lopez  Procedure(s) Performed: TOTAL ABDOMINAL HYSTERECTOMY  WITH BILATERAL SALPINGECTOMY (Bilateral) HERNIA REPAIR VENTRAL ADULT  Patient Location: PACU  Anesthesia Type:General  Level of Consciousness: awake  Airway & Oxygen Therapy: Patient Spontanous Breathing and Patient connected to face mask oxygen  Post-op Assessment: Report given to RN  Post vital signs: Reviewed and stable  Last Vitals:  Vitals Value Taken Time  BP 118/69 03/17/23 1408  Temp    Pulse 68 03/17/23 1415  Resp 20 03/17/23 1415  SpO2 96 % 03/17/23 1415  Vitals shown include unfiled device data.  Last Pain:  Vitals:   03/17/23 0837  TempSrc: Temporal  PainSc: 0-No pain         Complications: No notable events documented.

## 2023-03-17 NOTE — Op Note (Signed)
    OPERATIVE NOTE 03/17/2023 1:58 PM  PRE-OPERATIVE DIAGNOSIS:  1) Uterine Fibroids recurrent; incisional hernia w/ incarceration K43.0  POST-OPERATIVE DIAGNOSIS:  Uterine Fibroids recurrent; incisional hernia w/ incarceration K43.0  OPERATION: Total Abdominal Hysterectomy with Bilateral Salpingectomy  SURGEON(S): Surgeons and Role: Panel 1:    Linzie Collin, MD - Primary    * Hildred Laser, MD - Assisting Panel 2:    Sung Amabile, DO - Primary   ASSISTANT:  DeFrancesco   Valentino Saxon  ANESTHESIA: Choice  ESTIMATED BLOOD LOSS: 420 mL  OPERATIVE FINDINGS: Large uterine fibroids and abdominal scar adhesions.  SPECIMEN:  ID Type Source Tests Collected by Time Destination  1 : cervix, uterus, bilateral fallopian tubes GYN Uterus, Cervix, and Bilateral Fallopian Tubes SURGICAL PATHOLOGY Linzie Collin, MD 03/17/2023 1140   2 : hernia sac Tissue Hernia Sac, Ventral SURGICAL PATHOLOGY Sung Amabile, DO 03/17/2023 1259     COMPLICATIONS: None  DRAINS: Foley to gravity  DISPOSITION: Stable to recovery room  DESCRIPTION OF PROCEDURE: The patient was prepped and draped in the supine position and placed under spinal anesthesia.  A transverse incision was made across the abdomen in a Pfannenstiel manner. We carried the dissection down to the level of the fascia.  The fascia was incised in a curvilinear manner.  The fascia was then elevated from the rectus muscles with blunt and sharp dissection.  The rectus muscles were separated laterally exposing the peritoneum.  The peritoneum was carefully entered with care being taken to avoid bowel and bladder.  A self-retaining retractor was placed. The uterus was grasped and after lysing adhesions, coagulating and dividing the utero-ovarian ligaments and dividing the mesenteric side of the fallopian tubes it was able to be elevated through the abdominal wall incision.  The left and right round ligaments were identified and fulgurated.  The  anterior portion of the broad ligament was then incised and the bladder was taken down along the cervix mobilizing the bladder from the cervix.  The uterine arteries were skeletonized. The uterine arteries were then clamped the pedicle was coagulated and divided. The uterine arteries were  coagulated and divided. We proceeded down the lateral aspects of the cervix clamping coagulating and dividing all pedicles to the level of the external cervical os. The uterosacral ligaments were clamped coagulated and divided. A stab incision was made into the vagina and the cervix was cut free of the vaginal mucosa in a circumferential manner using the angled Jergensen scissors. The vaginal cuff was identified on Kocher clamps. Richardson angle sutures were placed in the usual manner and the vaginal cuff was closed with interrupted sutures of Vicryl. Hemostasis was noted. The remainder of the pedicles were carefully inspected and found to be hemostatic. The self-retaining retaining retractor was removed and the sponges were removed from the abdomen and pelvis. Hemostasis was again noted. The fascia was then closed with a running suture of #1 Vicryl.  Hemostasis of the subcutaneous tissues was obtained using the Bovie.  The subcutaneous tissues were closed with a running suture of 000 Vicryl.  A subcuticular suture was placed.  Steri-Strips were applied in the usual manner.  A pressure dressing was placed.  Dr. Tonna Boehringer scrubbed and began the hernia repair at this point.  Dr. Valentino Saxon provided exposure, dissection, suctioning, retraction, and general support and assistance during the procedure.   Elonda Husky, M.D. 03/17/2023 1:58 PM

## 2023-03-17 NOTE — Discharge Instructions (Signed)

## 2023-03-18 ENCOUNTER — Encounter: Payer: Self-pay | Admitting: Obstetrics and Gynecology

## 2023-03-18 NOTE — Progress Notes (Signed)
      POST-OP NOTE - DAY # 1  Subjective:   The patient does not have complaints.  She is ambulating well. She is taking PO well. Her pain is well controlled with her current medications, mostly using ibuprofen with only occasional Percocet.  She is urinating without difficulty.  Currently using nasal cannula oxygen.   Objective:  BP (!) 97/53 (BP Location: Left Arm)   Pulse 61   Temp (!) 97.5 F (36.4 C) (Oral)   Resp 18   Ht 5\' 6"  (1.676 m)   Wt 111.1 kg   LMP 03/02/2023 (Exact Date)   SpO2 93%   BMI 39.54 kg/m     O2 sats 92% on 2 L    Abdomen:                          Abdomen soft and nontender without distention or masses      Pfannenstiel dressing intact.    Assessment:   Doing well.  Normal progress as expected.     Plan:        Advance diet Encourage ambulation Continue incentive spirometry.  Consider discontinuation of nasal cannula O2 later today.   Elonda Husky, M.D. 03/18/2023 8:19 AM

## 2023-03-18 NOTE — Progress Notes (Signed)
Subjective:  CC: Robin Lopez is a 42 y.o. female  Hospital stay day 1, 1 Day Post-Op umbilical hernia repair with mesh  HPI: No issues reported.  No pain around umbilicus.  ROS:  General: Denies weight loss, weight gain, fatigue, fevers, chills, and night sweats. Heart: Denies chest pain, palpitations, racing heart, irregular heartbeat, leg pain or swelling, and decreased activity tolerance. Respiratory: Denies breathing difficulty, shortness of breath, wheezing, cough, and sputum. GI: Denies change in appetite, heartburn, nausea, vomiting, constipation, diarrhea, and blood in stool. GU: Denies difficulty urinating, pain with urinating, urgency, frequency, blood in urine.   Objective:   Temp:  [97.5 F (36.4 C)-98.8 F (37.1 C)] 98.8 F (37.1 C) (07/16 1139) Pulse Rate:  [59-75] 61 (07/16 0814) Resp:  [12-20] 20 (07/16 1139) BP: (95-124)/(53-77) 97/66 (07/16 1139) SpO2:  [90 %-96 %] 95 % (07/16 1139)     Height: 5\' 6"  (167.6 cm) Weight: 111.1 kg BMI (Calculated): 39.56   Intake/Output this shift:   Intake/Output Summary (Last 24 hours) at 03/18/2023 1447 Last data filed at 03/18/2023 1439 Gross per 24 hour  Intake 1997.19 ml  Output 2500 ml  Net -502.81 ml    Constitutional :  alert, cooperative, appears stated age, and no distress  Respiratory:  clear to auscultation bilaterally  Cardiovascular:  regular rate and rhythm  Gastrointestinal: soft, non-tender; bowel sounds normal; no masses,  no organomegaly.   Skin: Cool and moist. Incision c/d/i  Psychiatric: Normal affect, non-agitated, not confused       LABS:     Latest Ref Rng & Units 09/23/2022    6:52 PM 11/07/2021   11:13 AM 09/07/2021   12:42 AM  CMP  Glucose 70 - 99 mg/dL 657  846  962   BUN 6 - 20 mg/dL 16  11  15    Creatinine 0.44 - 1.00 mg/dL 9.52  8.41  3.24   Sodium 135 - 145 mmol/L 135  136  131   Potassium 3.5 - 5.1 mmol/L 3.9  4.1  3.7   Chloride 98 - 111 mmol/L 106  105  103   CO2 22 - 32 mmol/L  21  22  18    Calcium 8.9 - 10.3 mg/dL 8.5  8.7  8.9   Total Protein 6.5 - 8.1 g/dL 7.0   7.4   Total Bilirubin 0.3 - 1.2 mg/dL 0.4   0.5   Alkaline Phos 38 - 126 U/L 112   151   AST 15 - 41 U/L 15   23   ALT 0 - 44 U/L 12   19       Latest Ref Rng & Units 03/14/2023   12:07 PM 09/23/2022    6:52 PM 04/08/2022   10:14 AM  CBC  WBC 4.0 - 10.5 K/uL 16.0  15.3  14.4   Hemoglobin 12.0 - 15.0 g/dL 40.1  02.7  25.3   Hematocrit 36.0 - 46.0 % 38.7  40.1  40.5   Platelets 150 - 400 K/uL 322  295  302     RADS: Na/ Assessment:   S/p open umbilical hernia repair with mesh.  No concerns.  Ok to d/c from gen surg standpoint.  labs/images/medications/previous chart entries reviewed personally and relevant changes/updates noted above.

## 2023-03-19 MED ORDER — IBUPROFEN 800 MG PO TABS: 800.0000 mg | ORAL_TABLET | Freq: Three times a day (TID) | ORAL | 0 refills | Status: DC | PRN

## 2023-03-19 MED ORDER — OXYCODONE-ACETAMINOPHEN 5-325 MG PO TABS: 1.0000 | ORAL_TABLET | ORAL | 0 refills | Status: DC | PRN

## 2023-03-19 NOTE — Progress Notes (Signed)
Patient discharged. Discharge instructions given. Patient verbalizes understanding. Transported by axillary. 

## 2023-03-19 NOTE — Discharge Summary (Signed)
   Discharge Summary  Admit date: 03/17/2023  Discharge Date and Time:03/19/2023  11:42 AM  Discharge to:  Home  Admission Diagnosis: Present on Admission:  Uterine leiomyoma                     Discharge  Diagnoses: Principal Problem:   Post-operative state Active Problems:   Uterine leiomyoma   DUB (dysfunctional uterine bleeding)   OR Procedures:   Procedure(s): TOTAL ABDOMINAL HYSTERECTOMY  WITH BILATERAL SALPINGECTOMY HERNIA REPAIR VENTRAL ADULT Date ------------------- **Canceled**  Procedure(s): TOTAL ABDOMINAL HYSTERECTOMY  WITH BILATERAL SALPINGECTOMY HERNIA REPAIR VENTRAL ADULT Date -------------------                              Discharge Day Progress Note:   Subjective:   The patient does not have complaints.  She is ambulating well. She is taking PO well. Her pain is well controlled with her current medications. She is urinating without difficulty and is passing flatus.   Objective:  BP 135/84 (BP Location: Left Arm)   Pulse 72   Temp 98.4 F (36.9 C) (Oral)   Resp 19   Ht 5\' 6"  (1.676 m)   Wt 111.1 kg   LMP 03/02/2023 (Exact Date)   SpO2 94%   BMI 39.54 kg/m     Abdomen:                         Honeycomb in place    Assessment:   Doing well.  Normal progress as expected.     Plan:        Discharge home.                       Medications as directed.  Hospital Course:  No notes on file   Condition at Discharge:  good Discharge Medications:  Allergies as of 03/19/2023       Reactions   Prednisone Itching, Rash        Medication List     TAKE these medications    ibuprofen 800 MG tablet Commonly known as: ADVIL Take 1 tablet (800 mg total) by mouth every 8 (eight) hours as needed.   oxyCODONE-acetaminophen 5-325 MG tablet Commonly known as: PERCOCET/ROXICET Take 1-2 tablets by mouth every 4 (four) hours as needed for moderate pain.               Discharge Care Instructions  (From admission, onward)            Start     Ordered   03/19/23 0000  No dressing needed       Comments: Keep wound area clean and dry as directed   03/19/23 1141             Follow Up:    Follow-up Information     Tonna Boehringer, Isami, DO Follow up in 2 week(s).   Specialties: General Surgery, Surgery Why: post op umbilical hernia repair Contact information: [address] [place] [address] [phone]         Linzie Collin, MD Follow up in 1 week(s).   Specialties: Obstetrics and Gynecology, Radiology Contact information: [address] [place] [address] [phone]                 Elonda Husky, M.D. 03/19/2023 11:42 AM

## 2023-03-20 ENCOUNTER — Telehealth: Payer: Self-pay

## 2023-03-20 NOTE — Transitions of Care (Post Inpatient/ED Visit) (Unsigned)
   03/20/2023  Name: Pierce Biagini MRN: [id number] DOB: 10-19-1980  Today's TOC FU Call Status: Today's TOC FU Call Status:: Unsuccessul Call (1st Attempt) Unsuccessful Call (1st Attempt) Date: 03/20/23  Attempted to reach the patient regarding the most recent Inpatient/ED visit.  Follow Up Plan: Additional outreach attempts will be made to reach the patient to complete the Transitions of Care (Post Inpatient/ED visit) call.   Signature   Woodfin Ganja LPN [facility] Nurse Health Advisor Direct Dial [phone]

## 2023-03-24 NOTE — Transitions of Care (Post Inpatient/ED Visit) (Signed)
   03/24/2023  Name: Robin Lopez MRN: [id number] DOB: [birth year]  Today's TOC FU Call Status: Today's TOC FU Call Status:: Successful TOC FU Call Competed Unsuccessful Call (1st Attempt) Date: 03/20/23 Unsuccessful Call (2nd Attempt) Date: 03/24/23 [facility] FU Call Complete Date: 03/24/23  Attempted to reach the patient regarding the most recent Inpatient/ED visit.  Follow Up Plan: No further outreach attempts will be made at this time. We have been unable to contact the patient.  Signature   Woodfin Ganja LPN [facility] Nurse Health Advisor Direct Dial [phone]

## 2023-03-25 ENCOUNTER — Encounter: Payer: Self-pay | Admitting: Obstetrics and Gynecology

## 2023-03-25 ENCOUNTER — Ambulatory Visit (INDEPENDENT_AMBULATORY_CARE_PROVIDER_SITE_OTHER): Payer: 59 | Admitting: Obstetrics and Gynecology

## 2023-03-25 VITALS — BP 149/91 | HR 62 | Ht 66.0 in | Wt 258.8 lb

## 2023-03-25 DIAGNOSIS — Z0289 Encounter for other administrative examinations: Secondary | ICD-10-CM

## 2023-03-25 DIAGNOSIS — Z4889 Encounter for other specified surgical aftercare: Secondary | ICD-10-CM

## 2023-03-25 DIAGNOSIS — Z9889 Other specified postprocedural states: Secondary | ICD-10-CM

## 2023-03-25 NOTE — Progress Notes (Signed)
HPI:      [name] is a 42 y.o. [id number] who LMP was Patient's last menstrual period was 03/02/2023 (exact date).  Subjective:   She presents today 1 week from abdominal hysterectomy for large uterine fibroids and abdominal wall hernia repair.  She reports she is doing well.  Ambulating voiding having bowel movements daily without issue.  She is eating and drinking without problem.  She reports that her pain is controlled well with medications.  She says she takes 1 Percocet per day.  She reports that her honeycomb dressing has started to come off.    Hx: The following portions of the patient's history were reviewed and updated as appropriate:             She  has a past medical history of Anemia, Left leg DVT (HCC), Menorrhagia, Pneumonia, and PONV (postoperative nausea and vomiting). She does not have any pertinent problems on file. She  has a past surgical history that includes Cholecystectomy; Cesarean section; Tubal ligation; Wisdom tooth extraction; Hysterectomy abdominal with salpingectomy (Bilateral, 03/17/2023); and Ventral hernia repair (N/A, 03/17/2023). Her family history includes CAD in her father; Deep vein thrombosis in her brother; Hypertension in her father; Liver cancer in her father; Stomach cancer in her maternal grandfather. She  reports that she has never smoked. She has never used smokeless tobacco. She reports that she does not drink alcohol and does not use drugs. She has a current medication list which includes the following prescription(s): ibuprofen and oxycodone-acetaminophen. She is allergic to prednisone.       Review of Systems:  Review of Systems  Constitutional: Denied constitutional symptoms, night sweats, recent illness, fatigue, fever, insomnia and weight loss.  Eyes: Denied eye symptoms, eye pain, photophobia, vision change and visual disturbance.  Ears/Nose/Throat/Neck: Denied ear, nose, throat or neck symptoms, hearing loss, nasal discharge, sinus  congestion and sore throat.  Cardiovascular: Denied cardiovascular symptoms, arrhythmia, chest pain/pressure, edema, exercise intolerance, orthopnea and palpitations.  Respiratory: Denied pulmonary symptoms, asthma, pleuritic pain, productive sputum, cough, dyspnea and wheezing.  Gastrointestinal: Denied, gastro-esophageal reflux, melena, nausea and vomiting.  Genitourinary: Denied genitourinary symptoms including symptomatic vaginal discharge, pelvic relaxation issues, and urinary complaints.  Musculoskeletal: Denied musculoskeletal symptoms, stiffness, swelling, muscle weakness and myalgia.  Dermatologic: Denied dermatology symptoms, rash and scar.  Neurologic: Denied neurology symptoms, dizziness, headache, neck pain and syncope.  Psychiatric: Denied psychiatric symptoms, anxiety and depression.  Endocrine: Denied endocrine symptoms including hot flashes and night sweats.   Meds:   Current Outpatient Medications on File Prior to Visit  Medication Sig Dispense Refill   ibuprofen (ADVIL) 800 MG tablet Take 1 tablet (800 mg total) by mouth every 8 (eight) hours as needed. 50 tablet 0   oxyCODONE-acetaminophen (PERCOCET/ROXICET) 5-325 MG tablet Take 1-2 tablets by mouth every 4 (four) hours as needed for moderate pain. 20 tablet 0   No current facility-administered medications on file prior to visit.      Objective:     Vitals:   03/25/23 0931 03/25/23 1003  BP: (!) 165/84 (!) 149/91  Pulse: 62    Filed Weights   03/25/23 0931  Weight: 258 lb 12.8 oz (117.4 kg)   Honeycomb dressing removed-Steri-Strips removed.            Abdomen: Soft.  Non-tender.  No masses.  No HSM.  Incision/s: Intact.  Healing well.  Erythema only along skin edge.  No drainage.    Somewhat moist under the honeycomb.  Malodorous.  Assessment:    [id number] Patient Active Problem List   Diagnosis Date Noted   Post-operative state 03/17/2023   DUB (dysfunctional uterine bleeding) 03/17/2023    Leukocytosis 04/08/2022   Iron deficiency anemia 11/20/2021   Blood loss anemia 06/21/2021   History of DVT (deep vein thrombosis) 05/16/2021   Uterine leiomyoma 05/16/2021     1. Post-operative state     Patient doing well.  Wound slightly erythematous and malodorous but intact and only appears as skin edge.  No evidence of cellulitis.   Plan:            1.  Wound care discussed in detail.  Patient to keep area clean.  Keep area dry with clean washcloth or sanitary pad.  Fever or worsening erythema and pain patient to contact us immediately. Orders No orders of the defined types were placed in this encounter.   No orders of the defined types were placed in this encounter.     F/U  Return in about 5 weeks (around 04/29/2023).  Robin Lopez, M.D. 03/25/2023 10:37 AM

## 2023-03-25 NOTE — Progress Notes (Signed)
Patient presents for 1 week postop follow-up following TAH with bilateral salpingectomy. She states discomfort when changing positions, taking ibuprofen and 1 percocet a day. She denies any bleeding or urinary concerns.

## 2023-03-26 NOTE — Anesthesia Postprocedure Evaluation (Addendum)
Anesthesia Post Note  Patient: Robin Lopez  Procedure(s) Performed: TOTAL ABDOMINAL HYSTERECTOMY  WITH BILATERAL SALPINGECTOMY (Bilateral) HERNIA REPAIR VENTRAL ADULT  Patient location during evaluation: PACU Anesthesia Type: General Level of consciousness: awake and alert Pain management: pain level controlled Vital Signs Assessment: post-procedure vital signs reviewed and stable Respiratory status: spontaneous breathing, nonlabored ventilation, respiratory function stable and patient connected to nasal cannula oxygen Cardiovascular status: blood pressure returned to baseline and stable Postop Assessment: no apparent nausea or vomiting Anesthetic complications: no   No notable events documented.   Last Vitals:  Vitals:   03/19/23 0803 03/19/23 1121  BP: 111/74 135/84  Pulse:  72  Resp: 18 19  Temp: 36.9 C 36.9 C  SpO2: 94%     Last Pain:  Vitals:   03/19/23 1121  TempSrc: Oral  PainSc:                  Lenard Simmer

## 2023-03-27 ENCOUNTER — Other Ambulatory Visit: Payer: Self-pay

## 2023-03-27 ENCOUNTER — Emergency Department: Payer: 59

## 2023-03-27 ENCOUNTER — Emergency Department
Admission: EM | Admit: 2023-03-27 | Discharge: 2023-03-27 | Disposition: A | Discharge status: Home / Self Care | Payer: 59 | Attending: Emergency Medicine | Admitting: Emergency Medicine

## 2023-03-27 DIAGNOSIS — J9811 Atelectasis: Secondary | ICD-10-CM | POA: Diagnosis not present

## 2023-03-27 DIAGNOSIS — R2242 Localized swelling, mass and lump, left lower limb: Secondary | ICD-10-CM | POA: Insufficient documentation

## 2023-03-27 DIAGNOSIS — R059 Cough, unspecified: Secondary | ICD-10-CM

## 2023-03-27 LAB — CBC
HCT: 32 % — ABNORMAL LOW (ref 36.0–46.0)
Hemoglobin: 11.2 g/dL — ABNORMAL LOW (ref 12.0–15.0)
MCH: 28.9 pg (ref 26.0–34.0)
MCHC: 35 g/dL (ref 30.0–36.0)
MCV: 82.7 fL (ref 80.0–100.0)
Platelets: 357 10*3/uL (ref 150–400)
RBC: 3.87 MIL/uL (ref 3.87–5.11)
RDW: 13.2 % (ref 11.5–15.5)
WBC: 9.6 10*3/uL (ref 4.0–10.5)
nRBC: 0 % (ref 0.0–0.2)

## 2023-03-27 LAB — BASIC METABOLIC PANEL
Anion gap: 7 (ref 5–15)
BUN: 10 mg/dL (ref 6–20)
CO2: 25 mmol/L (ref 22–32)
Calcium: 8.4 mg/dL — ABNORMAL LOW (ref 8.9–10.3)
Chloride: 106 mmol/L (ref 98–111)
Creatinine, Ser: 0.83 mg/dL (ref 0.44–1.00)
GFR, Estimated: >60 mL/min (ref >60)
Glucose, Bld: 165 mg/dL — ABNORMAL HIGH (ref 70–99)
Potassium: 4 mmol/L (ref 3.5–5.1)
Sodium: 138 mmol/L (ref 135–145)

## 2023-03-27 LAB — TROPONIN I (HIGH SENSITIVITY): Troponin I (High Sensitivity): 6 ng/L (ref <18)

## 2023-03-27 MED ORDER — AZITHROMYCIN 250 MG PO TABS: ORAL_TABLET | ORAL | 0 refills | Status: DC

## 2023-03-27 MED ORDER — AZITHROMYCIN 500 MG PO TABS
500.0000 mg | ORAL_TABLET | Freq: Once | ORAL | Status: AC
  Administered 2023-03-27: 500 mg via ORAL
  Filled 2023-03-27: qty 1

## 2023-03-27 MED ORDER — IOHEXOL 350 MG/ML SOLN
100.0000 mL | Freq: Once | INTRAVENOUS | Status: AC | PRN
  Administered 2023-03-27: 100 mL via INTRAVENOUS

## 2023-03-27 NOTE — ED Provider Notes (Signed)
[place] EMERGENCY DEPARTMENT AT [facility] Provider Note   CSN: [id number] Arrival date & time: 03/27/23  1354     History  Chief Complaint  Patient presents with   Weakness    Robin Lopez is a 42 y.o. female presents to the emergency department for evaluation of left leg swelling and dry cough.  Patient has a history of DVT 1 year ago that resolved.  She is status post partial hysterectomy on 03/17/2023.  She denies any fevers, chills, chest pain, shortness of breath but does have a dry nonproductive cough this been present for the last few days as well as noticeable swelling in the left leg. HPI     Home Medications Prior to Admission medications   Medication Sig Start Date End Date Taking? Authorizing Provider  azithromycin (ZITHROMAX Z-PAK) 250 MG tablet Take 2 tablets (500 mg) on  Day 1,  followed by 1 tablet (250 mg) once daily on Days 2 through 5. 03/27/23  Yes Evon Slack, PA-C  ibuprofen (ADVIL) 800 MG tablet Take 1 tablet (800 mg total) by mouth every 8 (eight) hours as needed. 03/19/23   Linzie Collin, MD  oxyCODONE-acetaminophen (PERCOCET/ROXICET) 5-325 MG tablet Take 1-2 tablets by mouth every 4 (four) hours as needed for moderate pain. 03/19/23   Linzie Collin, MD      Allergies    Prednisone    Review of Systems   Review of Systems  Physical Exam Updated Vital Signs BP (!) 158/85 (BP Location: Left Arm)   Pulse 67   Temp 98.2 F (36.8 C) (Oral)   Resp (!) 21   Ht 5\' 6"  (1.676 m)   Wt 111.1 kg   LMP 03/02/2023 (Exact Date)   SpO2 99%   BMI 39.54 kg/m  Physical Exam Constitutional:      Appearance: She is well-developed.  HENT:     Head: Normocephalic and atraumatic.     Nose: Nose normal.  Eyes:     Conjunctiva/sclera: Conjunctivae normal.  Cardiovascular:     Rate and Rhythm: Normal rate.     Pulses: Normal pulses.     Heart sounds: Normal heart sounds.  Pulmonary:     Effort: Pulmonary effort is normal. No  respiratory distress.     Breath sounds: No stridor. No wheezing, rhonchi or rales.  Abdominal:     General: Abdomen is flat. There is no distension.     Tenderness: There is no abdominal tenderness. There is no guarding.  Musculoskeletal:        General: Normal range of motion.     Cervical back: Normal range of motion.     Comments: Mild swelling left lower extremity, no significant swelling of the right lower extremity.  2+ dorsalis pedis pulses bilaterally.  Skin:    General: Skin is warm.     Findings: No rash.  Neurological:     General: No focal deficit present.     Mental Status: She is alert and oriented to person, place, and time. Mental status is at baseline.  Psychiatric:        Behavior: Behavior normal.        Thought Content: Thought content normal.     ED Results / Procedures / Treatments   Labs (all labs ordered are listed, but only abnormal results are displayed) Labs Reviewed  BASIC METABOLIC PANEL - Abnormal; Notable for the following components:      Result Value   Glucose, Bld 165 (*)  Calcium 8.4 (*)    All other components within normal limits  CBC - Abnormal; Notable for the following components:   Hemoglobin 11.2 (*)    HCT 32.0 (*)    All other components within normal limits  TROPONIN I (HIGH SENSITIVITY)  TROPONIN I (HIGH SENSITIVITY)    EKG None  Radiology CT Angio Chest Pulmonary Embolism (PE) W or WO Contrast  Result Date: 03/27/2023 CLINICAL DATA:  History of DVT, recent surgery, cough, chest pain, PE suspected EXAM: CT ANGIOGRAPHY CHEST WITH CONTRAST TECHNIQUE: Multidetector CT imaging of the chest was performed using the standard protocol during bolus administration of intravenous contrast. Multiplanar CT image reconstructions and MIPs were obtained to evaluate the vascular anatomy. RADIATION DOSE REDUCTION: This exam was performed according to the departmental dose-optimization program which includes automated exposure control,  adjustment of the mA and/or kV according to patient size and/or use of iterative reconstruction technique. CONTRAST:  OMNIPAQUE IOHEXOL 350 MG/ML SOLN COMPARISON:  Chest radiographs 03/27/2023 and CTA chest 11/07/2021 FINDINGS: Cardiovascular: Negative for acute pulmonary embolism. Normal caliber thoracic aorta. No pericardial effusion. Mediastinum/Nodes: Trachea and esophagus are unremarkable. No thoracic adenopathy Lungs/Pleura: Interlobular septal thickening and patchy ground-glass opacities bilaterally greatest in the right lower lobe. Scattered areas of linear bandlike atelectasis. No pleural effusion or pneumothorax. Upper Abdomen: No acute abnormality. Musculoskeletal: No acute fracture. Review of the MIP images confirms the above findings. IMPRESSION: 1. Negative for acute pulmonary embolism. 2. Interlobular septal thickening and patchy ground-glass opacities bilaterally greatest in the right lower lobe. Findings favored to represent atypical infection. Electronically Signed   By: Minerva Fester M.D.   On: 03/27/2023 18:45   US Venous Img Lower Unilateral Left  Result Date: 03/27/2023 CLINICAL DATA:  post op leg swelling EXAM: LEFT LOWER EXTREMITY VENOUS DOPPLER ULTRASOUND TECHNIQUE: Gray-scale sonography with compression, as well as color and duplex ultrasound, were performed to evaluate the deep venous system(s) from the level of the common femoral vein through the popliteal and proximal calf veins. COMPARISON:  CT AP 09/23/2022 FINDINGS: VENOUS Normal compressibility of the common femoral, superficial femoral, and popliteal veins, as well as the visualized calf veins. Visualized portions of profunda femoral vein and great saphenous vein unremarkable. No filling defects to suggest DVT on grayscale or color Doppler imaging. Doppler waveforms show normal direction of venous flow, normal respiratory plasticity and response to augmentation. Limited views of the contralateral common femoral vein are  unremarkable. OTHER No evidence of superficial thrombophlebitis or abnormal fluid collection. Limitations: none IMPRESSION: No evidence of femoropopliteal DVT or superficial thrombophlebitis within the LEFT lower extremity. Roanna Banning, MD [facility] Radiology Electronically Signed   By: Roanna Banning M.D.   On: 03/27/2023 18:11   DG Chest 2 View  Result Date: 03/27/2023 CLINICAL DATA:  Shortness of breath. Lightheadedness since last evening. EXAM: CHEST - 2 VIEW COMPARISON:  05/24/2021 FINDINGS: Examination is degraded due to patient body habitus Unchanged cardiac silhouette and mediastinal contours. Interval development of left mid lung linear heterogeneous opacities. There is mild diffuse slightly nodular thickening of the pulmonary interstitium. No pleural effusion or pneumothorax. No evidence of edema. No acute osseous abnormalities. Postcholecystectomy. IMPRESSION: Interval development of left mid lung linear heterogeneous opacities, favored to represent atelectasis though developing infection could have a similar appearance. Clinical correlation is advised. Electronically Signed   By: Simonne Come M.D.   On: 03/27/2023 15:26    Procedures Procedures    Medications Ordered in ED Medications  azithromycin (ZITHROMAX) tablet 500 mg (has no administration in time range)  iohexol (OMNIPAQUE) 350 MG/ML injection 100 mL (100 mLs Intravenous Contrast Given 03/27/23 1827)    ED Course/ Medical Decision Making/ A&P                             Medical Decision Making Amount and/or Complexity of Data Reviewed Labs: ordered. Radiology: ordered.  42 year old female with history of DVT presents with left lower leg swelling and cough 10 days out from hysterectomy.  She describes a dry cough, no fevers chills or bodyaches.  Vital signs are stable, afebrile.  O2 sats and heart rate within normal limits.  She is concerned about DVTs possible PE.  Due to  high risk based on history and recent surgery ultrasound left lower extremity performed and showed no DVT.  CT chest with contrast showed no pulmonary embolism.  Patient did have signs of atelectasis or atypical infection.  She is placed on azithromycin.  She is encouraged to take good deep breaths.  She will continue to monitor symptoms and if no improvement 1 week recommend follow-up with PCP. Final Clinical Impression(s) / ED Diagnoses Final diagnoses:  Atelectasis  Localized swelling of left lower extremity  Cough in adult    Rx / DC Orders ED Discharge Orders          Ordered    azithromycin (ZITHROMAX Z-PAK) 250 MG tablet        03/27/23 1918              Ronnette Juniper 03/27/23 1920    Merwyn Katos, MD 03/27/23 1946

## 2023-03-27 NOTE — Discharge Instructions (Signed)
Please take medication as prescribed.  Persistent cough after 1 week please follow-up with PCP.  Return to the ER as for any chest pain shortness of breath or for any worsening symptoms or any urgent changes in health

## 2023-03-27 NOTE — ED Triage Notes (Signed)
Pt sts that on the 15th of July she had a open hyst. Pt sts that she has a hx of DVT.

## 2023-03-29 ENCOUNTER — Encounter: Payer: Self-pay | Admitting: Obstetrics and Gynecology

## 2023-04-08 ENCOUNTER — Encounter: Payer: Self-pay | Admitting: Obstetrics and Gynecology

## 2023-04-16 ENCOUNTER — Encounter: Payer: Self-pay | Admitting: Obstetrics and Gynecology

## 2023-04-17 ENCOUNTER — Encounter: Payer: Self-pay | Admitting: Obstetrics and Gynecology

## 2023-04-17 ENCOUNTER — Telehealth: Payer: Self-pay | Admitting: Obstetrics and Gynecology

## 2023-04-17 ENCOUNTER — Ambulatory Visit (INDEPENDENT_AMBULATORY_CARE_PROVIDER_SITE_OTHER): Payer: 59 | Admitting: Obstetrics and Gynecology

## 2023-04-17 VITALS — BP 131/85 | HR 80 | Ht 66.0 in | Wt 238.8 lb

## 2023-04-17 DIAGNOSIS — Z9889 Other specified postprocedural states: Secondary | ICD-10-CM

## 2023-04-17 NOTE — Progress Notes (Signed)
HPI:      [name] is a 42 y.o. [id number] who LMP was Patient's last menstrual period was 03/02/2023 (exact date).  Subjective:   She presents today approximately 2 weeks from abdominal hysterectomy and hernia repair.  She reports that she is generally doing well having no issues with bowel movements urination or eating.  She says her pain is minimal.  She is having some issues with her hernia repair surgery and her skin incision.  She has followed up for this and it continues to be somewhat irritated and the skin incision is slow to heal. She reports that her hysterectomy incision has a spot that has opened up and is draining a small amount of yellowish discharge and she can see a small stitch there.    Hx: The following portions of the patient's history were reviewed and updated as appropriate:             She  has a past medical history of Anemia, Left leg DVT (HCC), Menorrhagia, Pneumonia, and PONV (postoperative nausea and vomiting). She does not have any pertinent problems on file. She  has a past surgical history that includes Cholecystectomy; Cesarean section; Tubal ligation; Wisdom tooth extraction; Hysterectomy abdominal with salpingectomy (Bilateral, 03/17/2023); and Ventral hernia repair (N/A, 03/17/2023). Her family history includes CAD in her father; Deep vein thrombosis in her brother; Hypertension in her father; Liver cancer in her father; Stomach cancer in her maternal grandfather. She  reports that she has never smoked. She has never used smokeless tobacco. She reports that she does not drink alcohol and does not use drugs. She has a current medication list which includes the following prescription(s): azithromycin and ibuprofen. She is allergic to prednisone.       Review of Systems:  Review of Systems  Constitutional: Denied constitutional symptoms, night sweats, recent illness, fatigue, fever, insomnia and weight loss.  Eyes: Denied eye symptoms, eye pain,  photophobia, vision change and visual disturbance.  Ears/Nose/Throat/Neck: Denied ear, nose, throat or neck symptoms, hearing loss, nasal discharge, sinus congestion and sore throat.  Cardiovascular: Denied cardiovascular symptoms, arrhythmia, chest pain/pressure, edema, exercise intolerance, orthopnea and palpitations.  Respiratory: Denied pulmonary symptoms, asthma, pleuritic pain, productive sputum, cough, dyspnea and wheezing.  Gastrointestinal: Denied, gastro-esophageal reflux, melena, nausea and vomiting.  Genitourinary: Denied genitourinary symptoms including symptomatic vaginal discharge, pelvic relaxation issues, and urinary complaints.  Musculoskeletal: Denied musculoskeletal symptoms, stiffness, swelling, muscle weakness and myalgia.  Dermatologic: Denied dermatology symptoms, rash and scar.  Neurologic: Denied neurology symptoms, dizziness, headache, neck pain and syncope.  Psychiatric: Denied psychiatric symptoms, anxiety and depression.  Endocrine: Denied endocrine symptoms including hot flashes and night sweats.   Meds:   Current Outpatient Medications on File Prior to Visit  Medication Sig Dispense Refill   azithromycin (ZITHROMAX Z-PAK) 250 MG tablet Take 2 tablets (500 mg) on  Day 1,  followed by 1 tablet (250 mg) once daily on Days 2 through 5. 6 each 0   ibuprofen (ADVIL) 800 MG tablet Take 1 tablet (800 mg total) by mouth every 8 (eight) hours as needed. 50 tablet 0   No current facility-administered medications on file prior to visit.      Objective:     Vitals:   04/17/23 0843  BP: 131/85  Pulse: 80   Filed Weights   04/17/23 0843  Weight: 238 lb 12.8 oz (108.3 kg)   Pfannenstiel incision:            Abdomen: Soft.  Non-tender.  No masses.  No HSM.  Incision Intact.  Healing well.  No erythema.  Scant yellow drainage from a pinpoint hole.  Suture extrusion noted and cut.             Assessment:    G2P2002 Patient Active Problem List   Diagnosis  Date Noted   Post-operative state 03/17/2023   DUB (dysfunctional uterine bleeding) 03/17/2023   Leukocytosis 04/08/2022   Iron deficiency anemia 11/20/2021   Blood loss anemia 06/21/2021   History of DVT (deep vein thrombosis) 05/16/2021   Uterine leiomyoma 05/16/2021     1. Post-operative state     Patient with excellent recovery postop.  TAH incision doing well.   Plan:            1.  Wound care discussed.  Plan follow-up in 4 weeks. Orders No orders of the defined types were placed in this encounter.   No orders of the defined types were placed in this encounter.     F/U  No follow-ups on file.  Elonda Husky, M.D. 04/17/2023 9:05 AM

## 2023-04-17 NOTE — Progress Notes (Signed)
Patient presents for 4 week postop follow-up following TAH. She states noticing an opening in her incision 2 nights ago, states a yellow/clear like discharge with a slight odor. Patient denies any pain, fever or chills.

## 2023-04-17 NOTE — Telephone Encounter (Signed)
I contacted the patient via phone. I left voicemail about an change in future return date for 8/27. Dr. Logan Bores recall for the patient is Return in about 4 weeks (around 05/15/2023).  The patient didn't stop at check to update the patient new return date.

## 2023-04-29 ENCOUNTER — Encounter: Payer: 59 | Admitting: Obstetrics and Gynecology

## 2023-05-06 ENCOUNTER — Encounter: Payer: Self-pay | Admitting: Obstetrics and Gynecology

## 2023-05-15 ENCOUNTER — Encounter: Payer: 59 | Admitting: Obstetrics and Gynecology

## 2023-05-15 DIAGNOSIS — Z9889 Other specified postprocedural states: Secondary | ICD-10-CM

## 2023-05-27 ENCOUNTER — Ambulatory Visit (INDEPENDENT_AMBULATORY_CARE_PROVIDER_SITE_OTHER): Payer: 59 | Admitting: Obstetrics and Gynecology

## 2023-05-27 ENCOUNTER — Encounter: Payer: Self-pay | Admitting: Obstetrics and Gynecology

## 2023-05-27 VITALS — BP 127/87 | HR 82 | Ht 66.0 in | Wt 241.2 lb

## 2023-05-27 DIAGNOSIS — Z4889 Encounter for other specified surgical aftercare: Secondary | ICD-10-CM

## 2023-05-27 DIAGNOSIS — Z9889 Other specified postprocedural states: Secondary | ICD-10-CM

## 2023-05-27 NOTE — Progress Notes (Signed)
HPI:      [name] is a 42 y.o. [id number] who LMP was Patient's last menstrual period was 03/02/2023 (exact date).  Subjective:   She presents today approximately 2 months from abdominal hysterectomy and hernia repair concurrently.  She reports that now she is completely healed.  She feels great.  Has resumed normal activities with exception of intercourse.    Hx: The following portions of the patient's history were reviewed and updated as appropriate:             She  has a past medical history of Anemia, Left leg DVT (HCC), Menorrhagia, Pneumonia, and PONV (postoperative nausea and vomiting). She does not have any pertinent problems on file. She  has a past surgical history that includes Cholecystectomy; Cesarean section; Tubal ligation; Wisdom tooth extraction; Hysterectomy abdominal with salpingectomy (Bilateral, 03/17/2023); and Ventral hernia repair (N/A, 03/17/2023). Her family history includes CAD in her father; Deep vein thrombosis in her brother; Hypertension in her father; Liver cancer in her father; Stomach cancer in her maternal grandfather. She  reports that she has never smoked. She has never used smokeless tobacco. She reports that she does not drink alcohol and does not use drugs. She has a current medication list which includes the following prescription(s): ibuprofen. She is allergic to prednisone.       Review of Systems:  Review of Systems  Constitutional: Denied constitutional symptoms, night sweats, recent illness, fatigue, fever, insomnia and weight loss.  Eyes: Denied eye symptoms, eye pain, photophobia, vision change and visual disturbance.  Ears/Nose/Throat/Neck: Denied ear, nose, throat or neck symptoms, hearing loss, nasal discharge, sinus congestion and sore throat.  Cardiovascular: Denied cardiovascular symptoms, arrhythmia, chest pain/pressure, edema, exercise intolerance, orthopnea and palpitations.  Respiratory: Denied pulmonary symptoms, asthma,  pleuritic pain, productive sputum, cough, dyspnea and wheezing.  Gastrointestinal: Denied, gastro-esophageal reflux, melena, nausea and vomiting.  Genitourinary: Denied genitourinary symptoms including symptomatic vaginal discharge, pelvic relaxation issues, and urinary complaints.  Musculoskeletal: Denied musculoskeletal symptoms, stiffness, swelling, muscle weakness and myalgia.  Dermatologic: Denied dermatology symptoms, rash and scar.  Neurologic: Denied neurology symptoms, dizziness, headache, neck pain and syncope.  Psychiatric: Denied psychiatric symptoms, anxiety and depression.  Endocrine: Denied endocrine symptoms including hot flashes and night sweats.   Meds:   Current Outpatient Medications on File Prior to Visit  Medication Sig Dispense Refill   ibuprofen (ADVIL) 800 MG tablet Take 1 tablet (800 mg total) by mouth every 8 (eight) hours as needed. (Patient not taking: Reported on 05/27/2023) 50 tablet 0   No current facility-administered medications on file prior to visit.      Objective:     Vitals:   05/27/23 1102  BP: 127/87  Pulse: 82   Filed Weights   05/27/23 1102  Weight: 241 lb 3.2 oz (109.4 kg)               Abdomen: Soft.  Non-tender.  No masses.  No HSM.  Incision/s: Intact.  Healing well.  No erythema.  No drainage.   Physical examination   Pelvic:   Vulva: Normal appearance.  No lesions.  Vagina: No lesions or abnormalities noted.  Support: Normal pelvic support.  Urethra No masses tenderness or scarring.  Meatus Normal size without lesions or prolapse.     Anus: Normal exam.  No lesions.  Perineum: Normal exam.  No lesions.        Bimanual      Adnexae: No masses.  Non-tender to palpation.  Cul-de-sac: Negative  for abnormality.             Assessment:    G2P2002 Patient Active Problem List   Diagnosis Date Noted   Post-operative state 03/17/2023   DUB (dysfunctional uterine bleeding) 03/17/2023   Leukocytosis 04/08/2022   Iron  deficiency anemia 11/20/2021   Blood loss anemia 06/21/2021   History of DVT (deep vein thrombosis) 05/16/2021   Uterine leiomyoma 05/16/2021     1. Post-operative state     Vaginal cuff healed well.  Abdominal incisions-excellent.  Patient feels well.   Plan:            1.  May resume normal activities. Orders No orders of the defined types were placed in this encounter.   No orders of the defined types were placed in this encounter.     F/U  No follow-ups on file. Follow-up for annual exam. Elonda Husky, M.D. 05/27/2023 11:31 AM

## 2023-05-27 NOTE — Progress Notes (Signed)
Patient presents for 2 month postop follow-up following TAH. She states doing well, returned to work and has not concerns today.

## 2023-07-10 ENCOUNTER — Ambulatory Visit: Payer: 59 | Admitting: Certified Nurse Midwife

## 2023-07-10 ENCOUNTER — Other Ambulatory Visit (HOSPITAL_COMMUNITY)
Admission: RE | Admit: 2023-07-10 | Discharge: 2023-07-10 | Disposition: A | Discharge status: Home / Self Care | Payer: 59 | Source: Ambulatory Visit | Attending: Certified Nurse Midwife | Admitting: Certified Nurse Midwife

## 2023-07-10 ENCOUNTER — Encounter: Payer: Self-pay | Admitting: Certified Nurse Midwife

## 2023-07-10 VITALS — BP 123/77 | HR 84 | Ht 66.0 in | Wt 233.4 lb

## 2023-07-10 DIAGNOSIS — N898 Other specified noninflammatory disorders of vagina: Secondary | ICD-10-CM | POA: Insufficient documentation

## 2023-07-10 MED ORDER — FLUCONAZOLE 150 MG PO TABS: 150.0000 mg | ORAL_TABLET | Freq: Once | ORAL | 1 refills | Status: AC

## 2023-07-10 NOTE — Progress Notes (Signed)
GYN ENCOUNTER NOTE  Subjective:       Robin Lopez is a 42 y.o. G57P2002 female is here for gynecologic evaluation of the following issues:  1. Vagial discharge, perineal burning and itching.  Pt states she was at someone else's home and had to use there soap ( She usually uses sensitive, unscented soap). She thinks this may have caused a yeast infection.    Gynecologic History Patient's last menstrual period was 03/02/2023 (exact date). Contraception: status post hysterectomy   Obstetric History OB History  Gravida Para Term Preterm AB Living  2 2 2     2   SAB IAB Ectopic Multiple Live Births          2    # Outcome Date GA Lbr Len/2nd Weight Sex Type Anes PTL Lv  2 Term 2003     CS-Unspec   LIV  1 Term 2001     CS-Unspec   LIV    Past Medical History:  Diagnosis Date   Anemia    Left leg DVT (HCC)    Menorrhagia    Pneumonia    PONV (postoperative nausea and vomiting)     Past Surgical History:  Procedure Laterality Date   CESAREAN SECTION     x 2   CHOLECYSTECTOMY     HYSTERECTOMY ABDOMINAL WITH SALPINGECTOMY Bilateral 03/17/2023   Procedure: TOTAL ABDOMINAL HYSTERECTOMY  WITH BILATERAL SALPINGECTOMY;  Surgeon: Linzie Collin, MD;  Location: [facility] ORS;  Service: Gynecology;  Laterality: Bilateral;   TUBAL LIGATION     VENTRAL HERNIA REPAIR N/A 03/17/2023   Procedure: HERNIA REPAIR VENTRAL ADULT;  Surgeon: Sung Amabile, DO;  Location: [facility] ORS;  Service: General;  Laterality: N/A;   WISDOM TOOTH EXTRACTION      Current Outpatient Medications on File Prior to Visit  Medication Sig Dispense Refill   ibuprofen (ADVIL) 800 MG tablet Take 1 tablet (800 mg total) by mouth every 8 (eight) hours as needed. (Patient not taking: Reported on 05/27/2023) 50 tablet 0   No current facility-administered medications on file prior to visit.    Allergies  Allergen Reactions   Prednisone Itching and Rash    Social History   Socioeconomic History   Marital status:  Divorced    Spouse name: Not on file   Number of children: 1   Years of education: Not on file   Highest education level: Not on file  Occupational History   Not on file  Tobacco Use   Smoking status: Never   Smokeless tobacco: Never  Vaping Use   Vaping status: Never Used  Substance and Sexual Activity   Alcohol use: No   Drug use: No   Sexual activity: Yes    Birth control/protection: Surgical    Comment: BTL '03/ Hysterectomy 03/17/23  Other Topics Concern   Not on file  Social History Narrative   Daughter in home   Social Determinants of Health   Financial Resource Strain: Not on file  Food Insecurity: Not on file  Transportation Needs: Not on file  Physical Activity: Not on file  Stress: Not on file  Social Connections: Not on file  Intimate Partner Violence: Not on file    Family History  Problem Relation Age of Onset   Liver cancer Father    Hypertension Father    CAD Father    Deep vein thrombosis Brother    Stomach cancer Maternal Grandfather    Colon cancer Neg Hx     The following portions  of the patient's history were reviewed and updated as appropriate: allergies, current medications, past family history, past medical history, past social history, past surgical history and problem list.  Review of Systems Review of Systems - Negative except as mentioned in HPI Review of Systems - General ROS: negative for - chills, fatigue, fever, hot flashes, malaise or night sweats Hematological and Lymphatic ROS: negative for - bleeding problems or swollen lymph nodes Gastrointestinal ROS: negative for - abdominal pain, blood in stools, change in bowel habits and nausea/vomiting Musculoskeletal ROS: negative for - joint pain, muscle pain or muscular weakness Genito-Urinary ROS: negative for - change in menstrual cycle, dysmenorrhea, dyspareunia, dysuria,, genital ulcers, hematuria, incontinence, irregular/heavy menses, nocturia or pelvic pain. Positive for vaginal  discharge, itching, and burning   Objective:   BP (!) 190/107   Pulse 96   Ht 5\' 6"  (1.676 m)   Wt 233 lb 6.4 oz (105.9 kg)   LMP 03/02/2023 (Exact Date)   BMI 37.67 kg/m  CONSTITUTIONAL: Well-developed, well-nourished female in no acute distress.  HENT:  Normocephalic, atraumatic.  NECK: Normal range of motion, supple, no masses.  Normal thyroid.  SKIN: Skin is warm and dry. No rash noted. Not diaphoretic. No erythema. No pallor. NEUROLGIC: Alert and oriented to person, place, and time. PSYCHIATRIC: Normal mood and affect. Normal behavior. Normal judgment and thought content. CARDIOVASCULAR:Not Examined RESPIRATORY: Not Examined BREASTS: Not Examined ABDOMEN: Soft, non distended; Non tender.  No Organomegaly. PELVIC:  External Genitalia: Normal  BUS: Normal Perineal redness noted   Vagina: Normal , white particulate discharge no odor    MUSCULOSKELETAL: Normal range of motion. No tenderness.  No cyanosis, clubbing, or edema.     Assessment:  vaginal discharge Vaginal burning and irritation    Plan:   Vaginal swab collected. Likely yeast infection. Orders placed for treatment. Discussed she may also have BV. Will wait for results to treat. Discussed repeating diflucan dose should she also need treatment for BV. Encouraged pt to use over the counter monistat externally to irritated perineal skin. Also discussed use of ice pad to help with discomfort. She verbalizes and agrees to plan. Follow up PRN.   Doreene Burke, CNM

## 2023-07-11 LAB — CERVICOVAGINAL ANCILLARY ONLY
Bacterial Vaginitis (gardnerella): POSITIVE — AB
Candida Glabrata: NEGATIVE
Candida Vaginitis: POSITIVE — AB
Chlamydia: NEGATIVE
Comment: NEGATIVE
Comment: NEGATIVE
Comment: NEGATIVE
Comment: NEGATIVE
Comment: NEGATIVE
Comment: NORMAL
Neisseria Gonorrhea: NEGATIVE
Trichomonas: NEGATIVE

## 2023-07-15 ENCOUNTER — Encounter: Payer: Self-pay | Admitting: Certified Nurse Midwife

## 2023-07-15 ENCOUNTER — Other Ambulatory Visit: Payer: Self-pay | Admitting: Certified Nurse Midwife

## 2023-07-15 MED ORDER — FLUCONAZOLE 150 MG PO TABS: 150.0000 mg | ORAL_TABLET | Freq: Once | ORAL | 0 refills | Status: AC

## 2023-07-15 MED ORDER — METRONIDAZOLE 500 MG PO TABS: 500.0000 mg | ORAL_TABLET | Freq: Two times a day (BID) | ORAL | 0 refills | Status: AC

## 2023-08-08 ENCOUNTER — Emergency Department: Payer: 59

## 2023-08-08 ENCOUNTER — Other Ambulatory Visit: Payer: Self-pay

## 2023-08-08 ENCOUNTER — Encounter: Payer: Self-pay | Admitting: Emergency Medicine

## 2023-08-08 ENCOUNTER — Emergency Department
Admission: EM | Admit: 2023-08-08 | Discharge: 2023-08-08 | Disposition: A | Discharge status: Home / Self Care | Payer: 59 | Attending: Emergency Medicine | Admitting: Emergency Medicine

## 2023-08-08 DIAGNOSIS — X509XXA Other and unspecified overexertion or strenuous movements or postures, initial encounter: Secondary | ICD-10-CM | POA: Insufficient documentation

## 2023-08-08 DIAGNOSIS — S39012A Strain of muscle, fascia and tendon of lower back, initial encounter: Secondary | ICD-10-CM | POA: Diagnosis not present

## 2023-08-08 DIAGNOSIS — S3992XA Unspecified injury of lower back, initial encounter: Secondary | ICD-10-CM | POA: Diagnosis present

## 2023-08-08 LAB — URINALYSIS, ROUTINE W REFLEX MICROSCOPIC
Bilirubin Urine: NEGATIVE
Glucose, UA: NEGATIVE mg/dL
Hgb urine dipstick: NEGATIVE
Ketones, ur: NEGATIVE mg/dL
Leukocytes,Ua: NEGATIVE
Nitrite: NEGATIVE
Protein, ur: NEGATIVE mg/dL
Specific Gravity, Urine: 1.026 (ref 1.005–1.030)
pH: 5 (ref 5.0–8.0)

## 2023-08-08 LAB — CBC
HCT: 36.9 % (ref 36.0–46.0)
Hemoglobin: 13.2 g/dL (ref 12.0–15.0)
MCH: 29.3 pg (ref 26.0–34.0)
MCHC: 35.8 g/dL (ref 30.0–36.0)
MCV: 81.8 fL (ref 80.0–100.0)
Platelets: 335 10*3/uL (ref 150–400)
RBC: 4.51 MIL/uL (ref 3.87–5.11)
RDW: 12.8 % (ref 11.5–15.5)
WBC: 17.5 10*3/uL — ABNORMAL HIGH (ref 4.0–10.5)
nRBC: 0 % (ref 0.0–0.2)

## 2023-08-08 LAB — BASIC METABOLIC PANEL
Anion gap: 11 (ref 5–15)
BUN: 21 mg/dL — ABNORMAL HIGH (ref 6–20)
CO2: 22 mmol/L (ref 22–32)
Calcium: 8.9 mg/dL (ref 8.9–10.3)
Chloride: 102 mmol/L (ref 98–111)
Creatinine, Ser: 0.79 mg/dL (ref 0.44–1.00)
GFR, Estimated: >60 mL/min (ref >60)
Glucose, Bld: 272 mg/dL — ABNORMAL HIGH (ref 70–99)
Potassium: 4.1 mmol/L (ref 3.5–5.1)
Sodium: 135 mmol/L (ref 135–145)

## 2023-08-08 MED ORDER — DIAZEPAM 5 MG PO TABS: 5.0000 mg | ORAL_TABLET | Freq: Two times a day (BID) | ORAL | 0 refills | Status: DC | PRN

## 2023-08-08 NOTE — ED Notes (Signed)
See triage note  Presents with right lower back pain  States pain started couple of days ago  Denies any injury  ambulates well  States pain is non radiating

## 2023-08-08 NOTE — ED Triage Notes (Signed)
Patient ambulatory to triage with complaints of right lower flank pain since Sunday. Patient states that she feels that she has been punched in the back, endorses some nausea and frequency urinating. Patient states the pain has become unbearable.

## 2023-08-08 NOTE — ED Provider Notes (Signed)
[facility] Provider Note    Event Date/Time   First MD Initiated Contact with Patient 08/08/23 0730     (approximate)   History   Chief Complaint: Flank Pain   HPI  Robin Lopez is a 42 y.o. female who comes ED complaining of right low back pain for the past 5 days.  Reports that pain was present when she woke up in the morning, finding herself sideways on her bed with her legs hanging off of the side.  Worse with movement and changing position, nonradiating.  No fever dysuria vomiting diarrhea constipation.  Normal oral intake.  Denies trauma.  Pain is constant, waxing and waning, not worsening.  She has tried ibuprofen, lidocaine patch, Epsom soaks without relief.  She has noticed the most improvement with warm salt bath.          Physical Exam   Triage Vital Signs: ED Triage Vitals  Encounter Vitals Group     BP 08/08/23 0014 (!) 180/90     Systolic BP Percentile --      Diastolic BP Percentile --      Pulse Rate 08/08/23 0014 (!) 123     Resp 08/08/23 0014 18     Temp 08/08/23 0014 97.8 F (36.6 C)     Temp Source 08/08/23 0014 Oral     SpO2 08/08/23 0014 99 %     Weight 08/08/23 0016 230 lb (104.3 kg)     Height 08/08/23 0016 5\' 6"  (1.676 m)     Head Circumference --      Peak Flow --      Pain Score 08/08/23 0016 10     Pain Loc --      Pain Education --      Exclude from Growth Chart --     Most recent vital signs: Vitals:   08/08/23 0014 08/08/23 0742  BP: (!) 180/90 (!) 168/88  Pulse: (!) 123 (!) 108  Resp: 18 18  Temp: 97.8 F (36.6 C) 98 F (36.7 C)  SpO2: 99% 99%    General: Awake, no distress.  CV:  Good peripheral perfusion.  Regular rate and rhythm Resp:  Normal effort.  Abd:  No distention.  Soft, nontender.  No CVA tenderness Other:  No midline spinal tenderness.  There is tenderness over the right lower back musculature which reproduces her pain.   ED Results / Procedures / Treatments   Labs (all  labs ordered are listed, but only abnormal results are displayed) Labs Reviewed  URINALYSIS, ROUTINE W REFLEX MICROSCOPIC - Abnormal; Notable for the following components:      Result Value   Color, Urine YELLOW (*)    APPearance HAZY (*)    All other components within normal limits  BASIC METABOLIC PANEL - Abnormal; Notable for the following components:   Glucose, Bld 272 (*)    BUN 21 (*)    All other components within normal limits  CBC - Abnormal; Notable for the following components:   WBC 17.5 (*)    All other components within normal limits     EKG    RADIOLOGY CT abdomen pelvis interpreted by me, negative for ureterolithiasis.  Radiology report reviewed   PROCEDURES:  Procedures   MEDICATIONS ORDERED IN ED: Medications - No data to display   IMPRESSION / MDM / ASSESSMENT AND PLAN / ED COURSE  I reviewed the triage vital signs and the nursing notes.  DDx: Lumbar strain, ureterolithiasis, UTI, appendicitis  Patient's presentation is most consistent with acute presentation with potential threat to life or bodily function.  Patient presents with right low back pain, started after sleeping awkwardly.  History and exam are consistent with musculoskeletal source. Labs and CT are reassuring. Stable for DC with continued supportive care.    Clinical Course as of 08/08/23 5284  Fri Aug 08, 2023  0800 Izetta Dakin [PS]    Clinical Course User Index [PS] Sharman Cheek, MD     FINAL CLINICAL IMPRESSION(S) / ED DIAGNOSES   Final diagnoses:  Strain of lumbar region, initial encounter     Rx / DC Orders   ED Discharge Orders          Ordered    diazepam (VALIUM) 5 MG tablet  Every 12 hours PRN        08/08/23 1324             Note:  This document was prepared using Dragon voice recognition software and may include unintentional dictation errors.   Sharman Cheek, MD 08/08/23 [phone]

## 2024-02-11 ENCOUNTER — Ambulatory Visit: Admitting: Family Medicine

## 2024-02-11 VITALS — BP 170/104 | HR 89 | Temp 98.6°F | Wt 243.8 lb

## 2024-02-11 DIAGNOSIS — E1165 Type 2 diabetes mellitus with hyperglycemia: Secondary | ICD-10-CM

## 2024-02-11 DIAGNOSIS — R030 Elevated blood-pressure reading, without diagnosis of hypertension: Secondary | ICD-10-CM

## 2024-02-11 DIAGNOSIS — Z7984 Long term (current) use of oral hypoglycemic drugs: Secondary | ICD-10-CM | POA: Diagnosis not present

## 2024-02-11 DIAGNOSIS — J0100 Acute maxillary sinusitis, unspecified: Secondary | ICD-10-CM | POA: Diagnosis not present

## 2024-02-11 DIAGNOSIS — R739 Hyperglycemia, unspecified: Secondary | ICD-10-CM

## 2024-02-11 LAB — POCT GLYCOSYLATED HEMOGLOBIN (HGB A1C): Hemoglobin A1C: 7.9 % — AB (ref 4.0–5.6)

## 2024-02-11 MED ORDER — METFORMIN HCL 500 MG PO TABS: 500.0000 mg | ORAL_TABLET | Freq: Two times a day (BID) | ORAL | 3 refills | Status: DC

## 2024-02-11 MED ORDER — AMOXICILLIN-POT CLAVULANATE 875-125 MG PO TABS: 1.0000 | ORAL_TABLET | Freq: Two times a day (BID) | ORAL | 0 refills | Status: DC

## 2024-02-11 NOTE — Progress Notes (Signed)
 Established Patient Office Visit  Subjective   Patient ID: Robin Lopez, female    DOB: [birth year]  Age: 43 y.o. MRN: [id number]  Chief Complaint  Patient presents with   Sinusitis   Cough   Hoarse   Ear Pain    HPI   Robin Lopez is seen today with almost 3-week history of nasal congestion, hoarseness, productive cough, bilateral earache, and progressive left maxillary facial pain.  Home COVID test last week was negative.  She has been taking over-the-counter decongestant.  She has also had some recent bloody and yellowish to greenish nasal discharge.  Blood pressure was very high here today.  She does not have any history of hypertension.  Has had some recent headaches and malaise but also concomitant infection as above.  Has been taking OTC decongestant (phenylephrine)  In looking back through her chart she had ER visit back in December and had a blood sugar of 264.  Not clear if this was fasting or not.  Does not have any known history of type 2 diabetes.  Denies any polyuria or recent increased thirst.  Past Medical History:  Diagnosis Date   Anemia    Left leg DVT (HCC)    Menorrhagia    Pneumonia    PONV (postoperative nausea and vomiting)    Past Surgical History:  Procedure Laterality Date   CESAREAN SECTION     x 2   CHOLECYSTECTOMY     HYSTERECTOMY ABDOMINAL WITH SALPINGECTOMY Bilateral 03/17/2023   Procedure: TOTAL ABDOMINAL HYSTERECTOMY  WITH BILATERAL SALPINGECTOMY;  Surgeon: Zenobia Hila, MD;  Location: [facility] ORS;  Service: Gynecology;  Laterality: Bilateral;   TUBAL LIGATION     VENTRAL HERNIA REPAIR N/A 03/17/2023   Procedure: HERNIA REPAIR VENTRAL ADULT;  Surgeon: Conrado Delay, DO;  Location: [facility] ORS;  Service: General;  Laterality: N/A;   WISDOM TOOTH EXTRACTION      reports that she has never smoked. She has never used smokeless tobacco. She reports that she does not drink alcohol and does not use drugs. family history includes CAD in her father;  Deep vein thrombosis in her brother; Hypertension in her father; Liver cancer in her father; Stomach cancer in her maternal grandfather. Allergies  Allergen Reactions   Prednisone Itching and Rash    Review of Systems  Constitutional:  Positive for malaise/fatigue. Negative for chills and fever.  HENT:  Positive for congestion, ear pain and sinus pain.   Eyes:  Negative for blurred vision.  Respiratory:  Positive for cough. Negative for shortness of breath.   Cardiovascular:  Negative for chest pain.  Neurological:  Negative for dizziness, weakness and headaches.      Objective:     BP (!) 170/104 (BP Location: Left Arm, Patient Position: Sitting, Cuff Size: Normal)   Pulse 89   Temp 98.6 F (37 C) (Oral)   Wt 243 lb 12.8 oz (110.6 kg)   LMP 03/02/2023 (Exact Date)   SpO2 96%   BMI 39.35 kg/m  BP Readings from Last 3 Encounters:  02/11/24 (!) 170/104  08/08/23 (!) 168/88  07/10/23 123/77   Wt Readings from Last 3 Encounters:  02/11/24 243 lb 12.8 oz (110.6 kg)  08/08/23 230 lb (104.3 kg)  07/10/23 233 lb 6.4 oz (105.9 kg)      Physical Exam Vitals reviewed.  Constitutional:      General: She is not in acute distress.    Appearance: She is well-developed. She is not ill-appearing.  Eyes:  Pupils: Pupils are equal, round, and reactive to light.  Neck:     Thyroid : No thyromegaly.     Vascular: No JVD.  Cardiovascular:     Rate and Rhythm: Normal rate and regular rhythm.     Heart sounds:     No gallop.  Pulmonary:     Effort: Pulmonary effort is normal. No respiratory distress.     Breath sounds: Normal breath sounds. No wheezing or rales.  Musculoskeletal:     Cervical back: Neck supple.     Right lower leg: No edema.     Left lower leg: No edema.  Neurological:     Mental Status: She is alert.      No results found for any visits on 02/11/24.  Last CBC Lab Results  Component Value Date   WBC 17.5 (H) 08/08/2023   HGB 13.2 08/08/2023   HCT  36.9 08/08/2023   MCV 81.8 08/08/2023   MCH 29.3 08/08/2023   RDW 12.8 08/08/2023   PLT 335 08/08/2023   Last metabolic panel Lab Results  Component Value Date   GLUCOSE 272 (H) 08/08/2023   NA 135 08/08/2023   K 4.1 08/08/2023   CL 102 08/08/2023   CO2 22 08/08/2023   BUN 21 (H) 08/08/2023   CREATININE 0.79 08/08/2023   GFRNONAA >60 08/08/2023   CALCIUM 8.9 08/08/2023   PROT 7.0 09/23/2022   ALBUMIN  3.8 09/23/2022   BILITOT 0.4 09/23/2022   ALKPHOS 112 09/23/2022   AST 15 09/23/2022   ALT 12 09/23/2022   ANIONGAP 11 08/08/2023   Last lipids No results found for: CHOL, HDL, LDLCALC, LDLDIRECT, TRIG, CHOLHDL Last hemoglobin A1c Lab Results  Component Value Date   HGBA1C 5.8 (A) 10/15/2022      The ASCVD Risk score (Arnett DK, et al., 2019) failed to calculate for the following reasons:   Cannot find a previous HDL lab   Cannot find a previous total cholesterol lab    Assessment & Plan:   #1 acute sinusitis.  May have started off viral but she has had progressive symptoms now for 3 weeks including left maxillary sinus pain and purulent secretions.  Start Augmentin 875 mg twice daily for 10 days  #2 elevated blood pressure.  Repeat left arm seated after rest 152/108.  Leave off decongestant.  Recommend close home monitoring.  Set up follow-up in 2- 3 weeks to reassess.  If still up at that point consider initiating medication.  Also recommend try to keep daily sodium intake less than 2500 mg.  Handout on DASH diet given.  #3 hyperglycemia on multiple recent labs.  Check A1c today.  This is elevated at 7.9%  (5.8 one year ago).   Initiate Metformin 500 mg po bid. Set up 2 weeks follow up.   Check fasting glucose then Discuss at follow up:  eye exam, home glucose monitoring, ?nutrition/CDE referral, possible GLP-1 use, lipids, urine microalbumin screen, foot care.  Glean Lamy, MD

## 2024-02-11 NOTE — Patient Instructions (Addendum)
 Avoid decongestants such as phenylephrine or sudafed  Keep daily sodium intake < 2,500 mg   Set up follow up in 2-3 weeks to reassess.    SET UP 30 MINUTE FOLLOW UP IN 2 WEEKS.

## 2024-02-25 ENCOUNTER — Ambulatory Visit (INDEPENDENT_AMBULATORY_CARE_PROVIDER_SITE_OTHER): Admitting: Family Medicine

## 2024-02-25 ENCOUNTER — Ambulatory Visit: Payer: Self-pay | Admitting: Family Medicine

## 2024-02-25 VITALS — BP 132/86 | HR 80 | Temp 98.1°F | Wt 242.0 lb

## 2024-02-25 DIAGNOSIS — E1165 Type 2 diabetes mellitus with hyperglycemia: Secondary | ICD-10-CM | POA: Diagnosis not present

## 2024-02-25 DIAGNOSIS — Z13220 Encounter for screening for lipoid disorders: Secondary | ICD-10-CM | POA: Diagnosis not present

## 2024-02-25 DIAGNOSIS — R739 Hyperglycemia, unspecified: Secondary | ICD-10-CM

## 2024-02-25 DIAGNOSIS — Z7984 Long term (current) use of oral hypoglycemic drugs: Secondary | ICD-10-CM

## 2024-02-25 DIAGNOSIS — R030 Elevated blood-pressure reading, without diagnosis of hypertension: Secondary | ICD-10-CM

## 2024-02-25 LAB — LIPID PANEL
Cholesterol: 174 mg/dL (ref 0–200)
HDL: 33.9 mg/dL — ABNORMAL LOW (ref >39.00)
LDL Cholesterol: 98 mg/dL (ref 0–99)
NonHDL: 140.2
Total CHOL/HDL Ratio: 5
Triglycerides: 211 mg/dL — ABNORMAL HIGH (ref 0.0–149.0)
VLDL: 42.2 mg/dL — ABNORMAL HIGH (ref 0.0–40.0)

## 2024-02-25 LAB — POCT GLUCOSE (DEVICE FOR HOME USE): POC Glucose: 131 mg/dL — AB (ref 70–99)

## 2024-02-25 MED ORDER — DEXCOM G7 SENSOR MISC: 2 refills | Status: AC

## 2024-02-25 NOTE — Progress Notes (Signed)
 Established Patient Office Visit  Subjective   Patient ID: Robin Lopez, female    DOB: [birth year]  Age: 43 y.o. MRN: [id number]  Chief Complaint  Patient presents with   Medical Management of Chronic Issues    HPI   Robin Lopez is seen for medical follow-up.  Recently diagnosed type 2 diabetes She had A1c of 5.8 a year ago and recent A1c 7.9%.  We started metformin  500 mg twice daily.  She is tolerating well without side effects.  She is already made some dietary changes.  Her ex-husband had type 2 diabetes so she is well aware of dietary changes that need to be made.  She has good knowledge of diet with regard to diabetes.  She declines referral to diabetes educator at this time.  Currently does not have way to monitor her blood sugars.  She is interested in monitoring.  She is here fasting today to reassess glucose.  Strong family history of type 2 diabetes in her brother and grandmother.  She had recent blood pressure 170/104.  Had been taking decongestant.  Her sinusitis symptoms have improved following Augmentin .  Has not been monitoring blood pressures.  Denies any headache at this time.  Occasionally has chest tightness at night during sleep.  Generally sedentary but no exertional chest symptoms recently.  Her father had some heart history and details are somewhat vague.  Past Medical History:  Diagnosis Date   Anemia    Left leg DVT (HCC)    Menorrhagia    Pneumonia    PONV (postoperative nausea and vomiting)    Past Surgical History:  Procedure Laterality Date   CESAREAN SECTION     x 2   CHOLECYSTECTOMY     HYSTERECTOMY ABDOMINAL WITH SALPINGECTOMY Bilateral 03/17/2023   Procedure: TOTAL ABDOMINAL HYSTERECTOMY  WITH BILATERAL SALPINGECTOMY;  Surgeon: Janit Alm Agent, MD;  Location: [facility];  Service: Gynecology;  Laterality: Bilateral;   TUBAL LIGATION     VENTRAL HERNIA REPAIR N/A 03/17/2023   Procedure: HERNIA REPAIR VENTRAL ADULT;  Surgeon: Tye Millet, DO;   Location: [facility];  Service: General;  Laterality: N/A;   WISDOM TOOTH EXTRACTION      reports that she has never smoked. She has never used smokeless tobacco. She reports that she does not drink alcohol and does not use drugs. family history includes CAD in her father; Deep vein thrombosis in her brother; Hypertension in her father; Liver cancer in her father; Stomach cancer in her maternal grandfather. Allergies  Allergen Reactions   Prednisone Itching and Rash    Review of Systems  Constitutional:  Negative for malaise/fatigue.  Eyes:  Negative for blurred vision.  Respiratory:  Negative for shortness of breath.   Cardiovascular:  Negative for chest pain.  Gastrointestinal:  Negative for abdominal pain and diarrhea.  Neurological:  Negative for dizziness, weakness and headaches.      Objective:     BP 132/86 (BP Location: Left Arm, Cuff Size: Normal)   Pulse 80   Temp 98.1 F (36.7 C) (Oral)   Wt 242 lb (109.8 kg)   LMP 03/02/2023 (Exact Date)   SpO2 97%   BMI 39.06 kg/m  BP Readings from Last 3 Encounters:  02/25/24 132/86  02/11/24 (!) 170/104  08/08/23 (!) 168/88   Wt Readings from Last 3 Encounters:  02/25/24 242 lb (109.8 kg)  02/11/24 243 lb 12.8 oz (110.6 kg)  08/08/23 230 lb (104.3 kg)      Physical Exam Vitals reviewed.  Constitutional:      General: She is not in acute distress.    Appearance: She is well-developed. She is not ill-appearing.   Eyes:     Pupils: Pupils are equal, round, and reactive to light.   Neck:     Thyroid : No thyromegaly.     Vascular: No JVD.   Cardiovascular:     Rate and Rhythm: Normal rate and regular rhythm.     Heart sounds:     No gallop.  Pulmonary:     Effort: Pulmonary effort is normal. No respiratory distress.     Breath sounds: Normal breath sounds. No wheezing or rales.   Musculoskeletal:     Cervical back: Neck supple.     Right lower leg: No edema.     Left lower leg: No edema.   Neurological:      Mental Status: She is alert.      Results for orders placed or performed in visit on 02/25/24  POCT Glucose (Device for Home Use)  Result Value Ref Range   Glucose Fasting, POC     POC Glucose 131 (A) 70 - 99 mg/dl    Last CBC Lab Results  Component Value Date   WBC 17.5 (H) 08/08/2023   HGB 13.2 08/08/2023   HCT 36.9 08/08/2023   MCV 81.8 08/08/2023   MCH 29.3 08/08/2023   RDW 12.8 08/08/2023   PLT 335 08/08/2023   Last metabolic panel Lab Results  Component Value Date   GLUCOSE 272 (H) 08/08/2023   NA 135 08/08/2023   K 4.1 08/08/2023   CL 102 08/08/2023   CO2 22 08/08/2023   BUN 21 (H) 08/08/2023   CREATININE 0.79 08/08/2023   GFRNONAA >60 08/08/2023   CALCIUM 8.9 08/08/2023   PROT 7.0 09/23/2022   ALBUMIN  3.8 09/23/2022   BILITOT 0.4 09/23/2022   ALKPHOS 112 09/23/2022   AST 15 09/23/2022   ALT 12 09/23/2022   ANIONGAP 11 08/08/2023   Last hemoglobin A1c Lab Results  Component Value Date   HGBA1C 7.9 (A) 02/11/2024      The ASCVD Risk score (Arnett DK, et al., 2019) failed to calculate for the following reasons:   Cannot find a previous HDL lab   Cannot find a previous total cholesterol lab    Assessment & Plan:   #1 type 2 diabetes recently diagnosed.  A1c 7.9%.  Fasting glucose this morning 131.  Patient recently started on metformin .  We discussed several items as follows  -Discussed importance of regular exercise - Discussed diet in some detail.  Handout given.  She has good knowledge of what kind of things to avoid.  She is already making some positive changes.  Offered referral to diabetes educator but she declines - Discussed glucose monitoring.  She would like to consider continuous glucose monitor and we will try to get this covered for her. - Continue metformin  500 mg twice daily - Discussed importance of yearly diabetic eye exam - Consider baseline urine microalbumin at follow-up - Plan 63-month follow-up and recheck A1c then  #2  recent atypical chest symptoms at night at rest.  Discussed coronary calcium score especially in view of her other risk factors.  She agrees.  This was ordered.  We also discussed getting fasting lipid panel as she has not had this in several years.  Low threshold to consider treatment for elevated LDL  #3 recent elevated blood pressure.  Did improve significantly today.  We discussed goal less than 130/80.  She has made some recent positive lifestyle changes and blood pressure is much improved today compared to last visit.  Continue to monitor at follow-up.  Continue low-sodium diet.  She is trying to eat less processed food which should help  Return in about 3 months (around 05/27/2024).    Wolm Scarlet, MD

## 2024-03-04 ENCOUNTER — Encounter: Payer: Self-pay | Admitting: Family Medicine

## 2024-03-08 NOTE — Telephone Encounter (Signed)
 Noted

## 2024-03-15 ENCOUNTER — Ambulatory Visit (INDEPENDENT_AMBULATORY_CARE_PROVIDER_SITE_OTHER): Admitting: Family Medicine

## 2024-03-15 ENCOUNTER — Other Ambulatory Visit (HOSPITAL_COMMUNITY): Payer: Self-pay

## 2024-03-15 ENCOUNTER — Telehealth: Payer: Self-pay

## 2024-03-15 ENCOUNTER — Encounter: Payer: Self-pay | Admitting: Family Medicine

## 2024-03-15 VITALS — BP 138/88 | HR 86 | Temp 98.4°F | Wt 242.0 lb

## 2024-03-15 DIAGNOSIS — R030 Elevated blood-pressure reading, without diagnosis of hypertension: Secondary | ICD-10-CM | POA: Diagnosis not present

## 2024-03-15 DIAGNOSIS — E1165 Type 2 diabetes mellitus with hyperglycemia: Secondary | ICD-10-CM

## 2024-03-15 DIAGNOSIS — R233 Spontaneous ecchymoses: Secondary | ICD-10-CM | POA: Diagnosis not present

## 2024-03-15 DIAGNOSIS — Z7984 Long term (current) use of oral hypoglycemic drugs: Secondary | ICD-10-CM

## 2024-03-15 NOTE — Telephone Encounter (Signed)
 Pharmacy Patient Advocate Encounter  Received notification from [employer] that Prior Authorization for Dexcom G7 Sensor  has been DENIED.  Full denial letter will be uploaded to the media tab. See denial reason below.   PA #/Case ID/Reference #: [id number]

## 2024-03-15 NOTE — Telephone Encounter (Signed)
 Noted

## 2024-03-15 NOTE — Progress Notes (Signed)
 Established Patient Office Visit  Subjective   Patient ID: Robin Lopez, female    DOB: 1981/07/10  Age: 43 y.o. MRN: [id number]  Chief Complaint  Patient presents with   Toe Pain    HPI   Robin Lopez seen with some discoloration left great toe recently.  She noted over the weekend that this had somewhat of a reddish purplish look along the medial aspect.  Denies any injury.  Darker discoloration has faded somewhat since Saturday.  She had little bit of toe numbness.  Recent A1c 7.9%.  She is now taking metformin  500 mg twice daily.  She made some dietary changes and has greatly reduced carbohydrates.  We had prescribed continuous glucose monitor but apparently she was unable to get this covered.  Not monitoring blood sugars currently.  She has follow-up in September to reassess A1c.  Past Medical History:  Diagnosis Date   Anemia    Left leg DVT (HCC)    Menorrhagia    Pneumonia    PONV (postoperative nausea and vomiting)    Past Surgical History:  Procedure Laterality Date   CESAREAN SECTION     x 2   CHOLECYSTECTOMY     HYSTERECTOMY ABDOMINAL WITH SALPINGECTOMY Bilateral 03/17/2023   Procedure: TOTAL ABDOMINAL HYSTERECTOMY  WITH BILATERAL SALPINGECTOMY;  Surgeon: Janit Alm Agent, MD;  Location: [facility];  Service: Gynecology;  Laterality: Bilateral;   TUBAL LIGATION     VENTRAL HERNIA REPAIR N/A 03/17/2023   Procedure: HERNIA REPAIR VENTRAL ADULT;  Surgeon: Tye Millet, DO;  Location: [facility];  Service: General;  Laterality: N/A;   WISDOM TOOTH EXTRACTION      reports that she has never smoked. She has never used smokeless tobacco. She reports that she does not drink alcohol and does not use drugs. family history includes CAD in her father; Deep vein thrombosis in her brother; Hypertension in her father; Liver cancer in her father; Stomach cancer in her maternal grandfather. Allergies  Allergen Reactions   Prednisone Itching and Rash    Review of Systems   Constitutional:  Negative for chills, fever and malaise/fatigue.  Eyes:  Negative for blurred vision.  Respiratory:  Negative for shortness of breath.   Cardiovascular:  Negative for chest pain.  Neurological:  Negative for dizziness, weakness and headaches.      Objective:     BP 138/88 (BP Location: Left Arm, Cuff Size: Normal)   Pulse 86   Temp 98.4 F (36.9 C) (Oral)   Wt 242 lb (109.8 kg)   LMP 03/02/2023 (Exact Date)   SpO2 95%   BMI 39.06 kg/m  BP Readings from Last 3 Encounters:  03/15/24 138/88  02/25/24 132/86  02/11/24 (!) 170/104   Wt Readings from Last 3 Encounters:  03/15/24 242 lb (109.8 kg)  02/25/24 242 lb (109.8 kg)  02/11/24 243 lb 12.8 oz (110.6 kg)      Physical Exam Vitals reviewed.  Constitutional:      General: She is not in acute distress.    Appearance: She is not ill-appearing.  Cardiovascular:     Rate and Rhythm: Normal rate and regular rhythm.  Pulmonary:     Effort: Pulmonary effort is normal.     Breath sounds: Normal breath sounds.  Skin:    Comments: Left great toe on the medial aspect reveals near the base what appear to be small area of petechiae.  No warmth.  No localized tenderness.  No breaks in the skin noted.  No ulceration.  No necrosis.  Good capillary refill.  Feet are warm to touch with good distal pulses.  Normal sensory function to touch  Neurological:     Mental Status: She is alert.      No results found for any visits on 03/15/24.  Last CBC Lab Results  Component Value Date   WBC 17.5 (H) 08/08/2023   HGB 13.2 08/08/2023   HCT 36.9 08/08/2023   MCV 81.8 08/08/2023   MCH 29.3 08/08/2023   RDW 12.8 08/08/2023   PLT 335 08/08/2023   Last metabolic panel Lab Results  Component Value Date   GLUCOSE 272 (H) 08/08/2023   NA 135 08/08/2023   K 4.1 08/08/2023   CL 102 08/08/2023   CO2 22 08/08/2023   BUN 21 (H) 08/08/2023   CREATININE 0.79 08/08/2023   GFRNONAA >60 08/08/2023   CALCIUM 8.9 08/08/2023    PROT 7.0 09/23/2022   ALBUMIN  3.8 09/23/2022   BILITOT 0.4 09/23/2022   ALKPHOS 112 09/23/2022   AST 15 09/23/2022   ALT 12 09/23/2022   ANIONGAP 11 08/08/2023   Last lipids Lab Results  Component Value Date   CHOL 174 02/25/2024   HDL 33.90 (L) 02/25/2024   LDLCALC 98 02/25/2024   TRIG 211.0 (H) 02/25/2024   CHOLHDL 5 02/25/2024   Last hemoglobin A1c Lab Results  Component Value Date   HGBA1C 7.9 (A) 02/11/2024      The 10-year ASCVD risk score (Arnett DK, et al., 2019) is: 2.7%    Assessment & Plan:   #1 very localized petechiae left great toe.  Patient does not recall any trauma.  No evidence for circulatory issue.  No other generalized petechiae.  Recommend observation for now and follow-up for any new symptoms or other areas of petechiae  #2 type 2 diabetes with recent A1c 7.9%.  Patient now on metformin .  She made some positive dietary changes.  Has follow-up in September we plan to recheck A1c then  #3 elevated blood pressure initially 164/100 but came down substantially after rest.  She feels like this is stress related.  Continue home monitoring and be in touch if consistently greater than 130/80  Wolm Scarlet, MD

## 2024-03-15 NOTE — Telephone Encounter (Signed)
 Pharmacy Patient Advocate Encounter   Received notification from [employer] that prior authorization for Dexcom G7 Sensor is required/requested.   Insurance verification completed.   The patient is insured through [facility] .   Per test claim: PA required; PA submitted to above mentioned insurance via CoverMyMeds Key/confirmation #/EOC [id number] Status is pending

## 2024-03-19 ENCOUNTER — Ambulatory Visit (HOSPITAL_BASED_OUTPATIENT_CLINIC_OR_DEPARTMENT_OTHER)
Admission: RE | Admit: 2024-03-19 | Discharge: 2024-03-19 | Disposition: A | Discharge status: Home / Self Care | Payer: Self-pay | Source: Ambulatory Visit | Attending: Family Medicine | Admitting: Family Medicine

## 2024-03-19 ENCOUNTER — Telehealth: Payer: Self-pay

## 2024-03-19 DIAGNOSIS — R739 Hyperglycemia, unspecified: Secondary | ICD-10-CM | POA: Insufficient documentation

## 2024-03-19 NOTE — Telephone Encounter (Signed)
 Please see note from 03/15/2024. Message sent to PCP on regards to denial

## 2024-03-19 NOTE — Telephone Encounter (Signed)
 Copied from CRM [id number]. Topic: Clinical - Prescription Issue >> Mar 19, 2024  9:24 AM Berneda FALCON wrote: Reason for CRM: Pt states that the pharmacy is requiring an authorization from the provider to fill the order for the dexcom. States they sent over the request on 7/16.  [facility] [phone] - [place], [place] - [address] AT [facility] [address] [place] [address] Phone: [phone] Fax: [phone] Hours: Not open 24 hours  Please send patient message through MyChart when completed

## 2024-03-22 ENCOUNTER — Encounter: Payer: Self-pay | Admitting: Family Medicine

## 2024-03-22 NOTE — Telephone Encounter (Signed)
 Please see most recent Mychart encounter

## 2024-03-23 NOTE — Telephone Encounter (Signed)
 Appt changed to 30 mins f/u per PCP request

## 2024-03-24 ENCOUNTER — Ambulatory Visit (INDEPENDENT_AMBULATORY_CARE_PROVIDER_SITE_OTHER): Admitting: Family Medicine

## 2024-03-24 ENCOUNTER — Ambulatory Visit: Admitting: Family Medicine

## 2024-03-24 ENCOUNTER — Encounter: Payer: Self-pay | Admitting: Family Medicine

## 2024-03-24 VITALS — BP 160/100 | HR 119 | Temp 98.2°F | Wt 239.9 lb

## 2024-03-24 DIAGNOSIS — Z7984 Long term (current) use of oral hypoglycemic drugs: Secondary | ICD-10-CM

## 2024-03-24 DIAGNOSIS — I10 Essential (primary) hypertension: Secondary | ICD-10-CM

## 2024-03-24 DIAGNOSIS — E1165 Type 2 diabetes mellitus with hyperglycemia: Secondary | ICD-10-CM | POA: Diagnosis not present

## 2024-03-24 DIAGNOSIS — K760 Fatty (change of) liver, not elsewhere classified: Secondary | ICD-10-CM | POA: Diagnosis not present

## 2024-03-24 MED ORDER — TELMISARTAN 20 MG PO TABS: 20.0000 mg | ORAL_TABLET | Freq: Every day | ORAL | 5 refills | Status: AC

## 2024-03-24 NOTE — Telephone Encounter (Signed)
 Noted

## 2024-03-24 NOTE — Progress Notes (Unsigned)
 Established Patient Office Visit  Subjective   Patient ID: Robin Lopez, female    DOB: December 26, 1980  Age: 43 y.o. MRN: [id number]  Chief Complaint  Patient presents with   Medical Management of Chronic Issues    HPI  {History (Optional):23778} Robin Lopez seen back for medical follow-up.  Recently diagnosed with type 2 diabetes with A1c over 7.  She has made some very positive lifestyle changes since then.  She is been gradually losing some weight.  She has scaled back both sodium and overall carbohydrates.  Strong family history of CAD.  We obtained a coronary calcium score which came back 0.  However, study did show evidence for hepatic steatosis.  She does not use any alcohol.  Home blood pressures vary considerably but she has had several readings up to 130s and occasionally up around 140.  She has also had elevated readings here but they seem to come down significantly after rest.  She had requested FMLA papers to be completed for her time out of work for medical follow-up.  We recently started metformin  for her diabetes.  She is still getting some fasting blood sugars up around 150 or so.  She has scheduled follow-up in September to reassess A1c  Past Medical History:  Diagnosis Date   Anemia    Left leg DVT (HCC)    Menorrhagia    Pneumonia    PONV (postoperative nausea and vomiting)    Past Surgical History:  Procedure Laterality Date   CESAREAN SECTION     x 2   CHOLECYSTECTOMY     HYSTERECTOMY ABDOMINAL WITH SALPINGECTOMY Bilateral 03/17/2023   Procedure: TOTAL ABDOMINAL HYSTERECTOMY  WITH BILATERAL SALPINGECTOMY;  Surgeon: Janit Alm Agent, MD;  Location: [facility] ORS;  Service: Gynecology;  Laterality: Bilateral;   TUBAL LIGATION     VENTRAL HERNIA REPAIR N/A 03/17/2023   Procedure: HERNIA REPAIR VENTRAL ADULT;  Surgeon: Tye Millet, DO;  Location: [facility] ORS;  Service: General;  Laterality: N/A;   WISDOM TOOTH EXTRACTION      reports that she has never smoked. She  has never used smokeless tobacco. She reports that she does not drink alcohol and does not use drugs. family history includes CAD in her father; Deep vein thrombosis in her brother; Hypertension in her father; Liver cancer in her father; Stomach cancer in her maternal grandfather. Allergies  Allergen Reactions   Prednisone Itching and Rash    Review of Systems  Constitutional:  Negative for malaise/fatigue.  Eyes:  Negative for blurred vision.  Respiratory:  Negative for shortness of breath.   Cardiovascular:  Negative for chest pain.  Neurological:  Negative for dizziness, weakness and headaches.      Objective:     BP (!) 160/100 (BP Location: Left Arm, Patient Position: Sitting, Cuff Size: Normal)   Pulse (!) 119   Temp 98.2 F (36.8 C) (Oral)   Wt 239 lb 14.4 oz (108.8 kg)   LMP 03/02/2023 (Exact Date)   SpO2 96%   BMI 38.72 kg/m  BP Readings from Last 3 Encounters:  03/24/24 (!) 160/100  03/15/24 138/88  02/25/24 132/86   Wt Readings from Last 3 Encounters:  03/24/24 239 lb 14.4 oz (108.8 kg)  03/15/24 242 lb (109.8 kg)  02/25/24 242 lb (109.8 kg)      Physical Exam Vitals reviewed.  Constitutional:      Appearance: She is well-developed.  Eyes:     Pupils: Pupils are equal, round, and reactive to light.  Neck:  Thyroid : No thyromegaly.     Vascular: No JVD.  Cardiovascular:     Rate and Rhythm: Normal rate and regular rhythm.     Heart sounds:     No gallop.  Pulmonary:     Effort: Pulmonary effort is normal. No respiratory distress.     Breath sounds: Normal breath sounds. No wheezing or rales.  Musculoskeletal:     Cervical back: Neck supple.  Neurological:     Mental Status: She is alert.      No results found for any visits on 03/24/24.  {Labs (Optional):23779}  The 10-year ASCVD risk score (Arnett DK, et al., 2019) is: 3.6%    Assessment & Plan:   #1 type 2 diabetes.  Recent A1c 7.9%.  Now on metformin  500 g twice daily.  Still  having some fasting blood sugars 150.  Suspect this will start to come down soon with her lifestyle changes and weight loss.  We plan to recheck her A1c in September.  Continue lower glycemic diet.  She has a good understanding of appropriate diet.  We did offer referral to diabetes educator but she declines at this time  #2 hypertension.  Somewhat labile blood pressures.  Up quite a bit today initially 160/100 and came down to 138/88 after rest.  Recommend initiate telmisartan  20 mg daily.  Continue low-sodium diet.  Reassess at follow-up  #3 hepatic steatosis.  Discussed significance and relevance of this.  We explained main treatment is weight loss and improved glycemic control.  She does not use any alcohol.  Robin Scarlet, MD

## 2024-03-30 ENCOUNTER — Encounter: Payer: Self-pay | Admitting: Family Medicine

## 2024-03-31 ENCOUNTER — Encounter: Payer: Self-pay | Admitting: Family Medicine

## 2024-04-04 ENCOUNTER — Other Ambulatory Visit: Payer: Self-pay

## 2024-04-04 ENCOUNTER — Emergency Department (HOSPITAL_BASED_OUTPATIENT_CLINIC_OR_DEPARTMENT_OTHER)
Admission: EM | Admit: 2024-04-04 | Discharge: 2024-04-04 | Disposition: A | Discharge status: Home / Self Care | Attending: Emergency Medicine | Admitting: Emergency Medicine

## 2024-04-04 ENCOUNTER — Encounter (HOSPITAL_BASED_OUTPATIENT_CLINIC_OR_DEPARTMENT_OTHER): Payer: Self-pay

## 2024-04-04 DIAGNOSIS — Z79899 Other long term (current) drug therapy: Secondary | ICD-10-CM | POA: Insufficient documentation

## 2024-04-04 DIAGNOSIS — Z7984 Long term (current) use of oral hypoglycemic drugs: Secondary | ICD-10-CM | POA: Diagnosis not present

## 2024-04-04 DIAGNOSIS — R42 Dizziness and giddiness: Secondary | ICD-10-CM | POA: Diagnosis present

## 2024-04-04 DIAGNOSIS — M546 Pain in thoracic spine: Secondary | ICD-10-CM | POA: Insufficient documentation

## 2024-04-04 DIAGNOSIS — R0989 Other specified symptoms and signs involving the circulatory and respiratory systems: Secondary | ICD-10-CM | POA: Insufficient documentation

## 2024-04-04 DIAGNOSIS — I10 Essential (primary) hypertension: Secondary | ICD-10-CM | POA: Insufficient documentation

## 2024-04-04 DIAGNOSIS — E1165 Type 2 diabetes mellitus with hyperglycemia: Secondary | ICD-10-CM | POA: Insufficient documentation

## 2024-04-04 LAB — CBC
HCT: 36.8 % (ref 36.0–46.0)
Hemoglobin: 13.2 g/dL (ref 12.0–15.0)
MCH: 29.1 pg (ref 26.0–34.0)
MCHC: 35.9 g/dL (ref 30.0–36.0)
MCV: 81.1 fL (ref 80.0–100.0)
Platelets: 342 K/uL (ref 150–400)
RBC: 4.54 MIL/uL (ref 3.87–5.11)
RDW: 12.8 % (ref 11.5–15.5)
WBC: 12.2 K/uL — ABNORMAL HIGH (ref 4.0–10.5)
nRBC: 0 % (ref 0.0–0.2)

## 2024-04-04 LAB — URINALYSIS, ROUTINE W REFLEX MICROSCOPIC
Bilirubin Urine: NEGATIVE
Glucose, UA: NEGATIVE mg/dL
Hgb urine dipstick: NEGATIVE
Ketones, ur: NEGATIVE mg/dL
Leukocytes,Ua: NEGATIVE
Nitrite: NEGATIVE
Protein, ur: NEGATIVE mg/dL
Specific Gravity, Urine: 1.009 (ref 1.005–1.030)
pH: 5 (ref 5.0–8.0)

## 2024-04-04 LAB — CBG MONITORING, ED: Glucose-Capillary: 124 mg/dL — ABNORMAL HIGH (ref 70–99)

## 2024-04-04 LAB — LIPASE, BLOOD: Lipase: 23 U/L (ref 11–51)

## 2024-04-04 LAB — HEPATIC FUNCTION PANEL
ALT: 21 U/L (ref 0–44)
AST: 20 U/L (ref 15–41)
Albumin: 4.3 g/dL (ref 3.5–5.0)
Alkaline Phosphatase: 129 U/L — ABNORMAL HIGH (ref 38–126)
Bilirubin, Direct: 0.2 mg/dL (ref 0.0–0.2)
Indirect Bilirubin: 0.2 mg/dL — ABNORMAL LOW (ref 0.3–0.9)
Total Bilirubin: 0.3 mg/dL (ref 0.0–1.2)
Total Protein: 7.4 g/dL (ref 6.5–8.1)

## 2024-04-04 MED ORDER — SODIUM CHLORIDE 0.9 % IV BOLUS
1000.0000 mL | Freq: Once | INTRAVENOUS | Status: AC
Start: 2024-04-04 — End: 2024-04-04
  Administered 2024-04-04: 1000 mL via INTRAVENOUS

## 2024-04-04 NOTE — ED Notes (Signed)
 Reviewed AVS/discharge instruction with patient. Time allotted for and all questions answered. Patient is agreeable for d/c and escorted to ed exit by staff.

## 2024-04-04 NOTE — ED Notes (Signed)
 PA at bedside.

## 2024-04-04 NOTE — Discharge Instructions (Signed)
 STOP taking your blood pressure medications. Call your doctor to follow up  in the next 1-2 weeks.  I suspect your lightheadedness and dizziness is from your blood pressure medications. Your symptoms may improve if you discontinue your medications.  Get help right away if you develop severe chest pain, shortness of breat, bloody cough, loss of consciousness.

## 2024-04-04 NOTE — ED Triage Notes (Signed)
 Pt reports L sided flank/side pain x1 week. Pt also endorses dizziness. Pt also told to monitor blood pressure due to HTN, DM, and fatty liver. Pt reports her readings are abnormal. Pt denies any urinary s/s.

## 2024-04-04 NOTE — ED Provider Notes (Signed)
 [place] EMERGENCY DEPARTMENT AT [facility] Provider Note   CSN: [id number] Arrival date & time: 04/04/24  9042     Patient presents with: Flank Pain (/)   Robin Lopez is a 43 y.o. female who presents with multiple complaints. 1) patient complains of dizziness.  She reports feeling lightheaded but also feeling like she is spinning.  She states that symptoms come and go and can occur when she sitting or standing.  She is able to walk.  She does not have any balance issues.  Symptoms are not made worse with head movement or I have movement.  The denies near syncope.  She denies palpitations.  She denies chest pain shortness of breath.  She reports that she has been taking blood pressures serially at home and they are frequently low with systolics of about 107 and diastolics in the 60s.  She denies ringing in her ears or vomiting.  She does have a slight headache on the left side.  She denies neck pain.  She denies vision changes.  She feels that she is having trouble concentrating. 2) patient also reports pain on the left side of her posterior rib cage.  She states that the pain is worse when she tries to sit up or move from lying down.  She also reports that it seems worse after she eats.  She denies vomiting.  She has had had some nausea.  She denies pain with breathing. Patient reports that she was recently diagnosed with diabetes fatty liver and high blood pressure and placed on telmisartan  and metformin .      Flank Pain       Prior to Admission medications   Medication Sig Start Date End Date Taking? Authorizing Provider  Continuous Glucose Sensor (DEXCOM G7 SENSOR) MISC Use as directed to check blood sugar continuously 02/25/24   Burchette, Wolm ORN, MD  metFORMIN  (GLUCOPHAGE ) 500 MG tablet Take 1 tablet (500 mg total) by mouth 2 (two) times daily with a meal. 02/11/24   Burchette, Wolm ORN, MD  telmisartan  (MICARDIS ) 20 MG tablet Take 1 tablet (20 mg total) by mouth  daily. 03/24/24   Burchette, Wolm ORN, MD    Allergies: Prednisone    Review of Systems  Genitourinary:  Positive for flank pain.    Updated Vital Signs BP 98/71   Pulse 79   Temp 98.7 F (37.1 C) (Oral)   Resp 16   Ht 5' 6 (1.676 m)   Wt 108.4 kg   LMP 03/02/2023 (Exact Date)   SpO2 98%   BMI 38.58 kg/m   Physical Exam Vitals and nursing note reviewed.  Constitutional:      General: She is not in acute distress.    Appearance: She is well-developed. She is not diaphoretic.  HENT:     Head: Normocephalic and atraumatic.     Right Ear: External ear normal.     Left Ear: External ear normal.     Nose: Nose normal.     Mouth/Throat:     Mouth: Mucous membranes are moist.  Eyes:     General: No scleral icterus.    Extraocular Movements: Extraocular movements intact.     Conjunctiva/sclera: Conjunctivae normal.     Pupils: Pupils are equal, round, and reactive to light.  Cardiovascular:     Rate and Rhythm: Normal rate and regular rhythm.     Heart sounds: Normal heart sounds. No murmur heard.    No friction rub. No gallop.  Pulmonary:  Effort: Pulmonary effort is normal. No respiratory distress.     Breath sounds: Normal breath sounds.  Abdominal:     General: Bowel sounds are normal. There is no distension.     Palpations: Abdomen is soft. There is no mass.     Tenderness: There is no abdominal tenderness. There is no right CVA tenderness, left CVA tenderness or guarding.  Musculoskeletal:     Cervical back: Normal range of motion.     Thoracic back: Spasms and tenderness present.       Back:  Skin:    General: Skin is warm and dry.  Neurological:     General: No focal deficit present.     Mental Status: She is alert and oriented to person, place, and time.     Cranial Nerves: No cranial nerve deficit.     Sensory: No sensory deficit.     Motor: No weakness.     Coordination: Coordination normal.     Gait: Gait normal.     Deep Tendon Reflexes: Reflexes  normal.  Psychiatric:        Mood and Affect: Mood is anxious.        Behavior: Behavior normal.     (all labs ordered are listed, but only abnormal results are displayed) Labs Reviewed  CBC - Abnormal; Notable for the following components:      Result Value   WBC 12.2 (*)    All other components within normal limits  HEPATIC FUNCTION PANEL - Abnormal; Notable for the following components:   Alkaline Phosphatase 129 (*)    Indirect Bilirubin 0.2 (*)    All other components within normal limits  CBG MONITORING, ED - Abnormal; Notable for the following components:   Glucose-Capillary 124 (*)    All other components within normal limits  URINALYSIS, ROUTINE W REFLEX MICROSCOPIC  LIPASE, BLOOD    EKG: EKG Interpretation Date/Time:  Sunday April 04 2024 10:47:42 EDT Ventricular Rate:  80 PR Interval:  161 QRS Duration:  93 QT Interval:  367 QTC Calculation: 424 R Axis:   49  Text Interpretation: Sinus rhythm Low voltage, precordial leads Confirmed by Curatolo, Adam ([id number]) on 04/04/2024 10:51:50 AM  Radiology: No results found.   Procedures   Medications Ordered in the ED  sodium chloride 0.9 % bolus 1,000 mL (0 mLs Intravenous Stopped 04/04/24 1248)    02/26/2013 01/21/2014 05/09/2014 04/30/2015 10/22/2015 08/21/2016 08/28/2016  Vitals with BMI         Systolic 123  108  142  117  153  122  125   Systolic 149  165  126  166 !    142   Systolic       144   Diastolic 75  60  78  73  91  82  61   Diastolic 98  81  83  101 (H)    90   Diastolic       98   Pulse 80  60  74  74  94  83  66   Pulse 113  83  95  119   89  96   Pulse       95      03/21/2018 06/05/2018 06/06/2018 09/04/2018 09/17/2018 11/15/2018 11/25/2018  Vitals with BMI         Systolic 149 !  122  115  148 !  132 !  119 !  150 !   Systolic  126  162 !   Systolic  146 !        Diastolic 88 !  72  71  93 (H)  96 (H)  96 (H)  81 !   Diastolic  82      76 !   Diastolic  92 (H)        Pulse 74  74  70  86  102 !   80  62   Pulse  93      67   Pulse  94          06/09/2019 02/07/2020 02/11/2020 04/21/2020  Vitals with BMI      Systolic 139  160 !  134  185 !   Diastolic 75  83 !  77  104 (H)   Pulse 75  92  68  120 !     Legend: ! Abnormal (H) High Clinical Course as of 04/04/24 1252  Sun Apr 04, 2024  1135 Orthostatic Lying BP- Lying: 128/93 (!)Pulse- Lying: 87 Orthostatic Sitting BP- Sitting: 147/99 (!)Pulse- Sitting: 103 Orthostatic Standing at 0 minutes BP- Standing at 0 minutes: 154/108 (!)Pulse- Standing at 0 minutes: 103 Orthostatic Standing at 3 minutes BP- Standing at 3 minutes: 151/114 (!)Pulse- Standing at 3 minutes: 116   [AH]  1207 Patient's last dose of her telmisartan  7:00am  [AH]  1239 BP: 116/83 Patient Blood pressure and HR Normal when not in the room.  As soon as I walk into the room patient's heart rate significantly increases. [AH]    Clinical Course User Index [AH] Arloa Chroman, PA-C                                 Medical Decision Making 43 year old female who presents emergency department with chief complaint of lightheadedness.  She also has a complaint of pain in her left back which is reproducible with movement and to touch and consistent with musculoskeletal back pain.  She has no pleuritic chest pain suggestive of pulmonary embolus she is not complaining of chest pain or shortness of breath.   She has a normal neurologic exam on her symptoms are not consistent with vertigo. Patient's blood pressures are certainly labile.  She has elevated blood pressures and heart rate which have improved over time.  They do consider whether or not the patient had some whitecoat hypertension and is feeling dizzy after taking her blood pressure medications.  Review of EMR shows that the patient had contacted her physician who told her to discontinue taking her medicine if she is feeling dizzy and I believe this is likely the cause of her symptoms. And is feeling better after some  fluid and sitting quietly in the room.  She does not have significant orthostatic hypotension   Labs are unremarkable except for mildly elevated blood glucose and elevated alk phos consistent with her history of fatty liver.   EKG shows normal sinus rhythm at a rate of 80.   Case reviewed and discussed with Attending Physician who agrees with work up and plan including discharge with very close follow up . Discussed return precautions  Amount and/or Complexity of Data Reviewed Labs: ordered.        Final diagnoses:  Labile blood pressure    ED Discharge Orders     None          Arloa Chroman, PA-C 04/04/24 1254    Ruthe Cornet, DO 04/04/24 1338

## 2024-06-07 ENCOUNTER — Other Ambulatory Visit: Payer: Self-pay | Admitting: Family Medicine
